# Patient Record
Sex: Female | Born: 1940 | Race: Black or African American | Hispanic: No | Marital: Single | Smoking: Former smoker
Health system: Southern US, Community
[De-identification: ages and names within clinical notes are randomized; demographics above are authoritative.]

## PROBLEM LIST (undated history)

## (undated) DIAGNOSIS — C787 Secondary malignant neoplasm of liver and intrahepatic bile duct: Principal | ICD-10-CM

## (undated) DIAGNOSIS — I10 Essential (primary) hypertension: Secondary | ICD-10-CM

## (undated) DIAGNOSIS — D500 Iron deficiency anemia secondary to blood loss (chronic): Secondary | ICD-10-CM

## (undated) DIAGNOSIS — M1990 Unspecified osteoarthritis, unspecified site: Secondary | ICD-10-CM

## (undated) DIAGNOSIS — C7951 Secondary malignant neoplasm of bone: Secondary | ICD-10-CM

## (undated) DIAGNOSIS — C50919 Malignant neoplasm of unspecified site of unspecified female breast: Secondary | ICD-10-CM

## (undated) DIAGNOSIS — Z7189 Other specified counseling: Secondary | ICD-10-CM

## (undated) DIAGNOSIS — C7931 Secondary malignant neoplasm of brain: Secondary | ICD-10-CM

## (undated) HISTORY — DX: Secondary malignant neoplasm of brain: C79.31

## (undated) HISTORY — DX: Malignant neoplasm of unspecified site of unspecified female breast: C50.919

## (undated) HISTORY — PX: TONSILLECTOMY: SUR1361

## (undated) HISTORY — DX: Other specified counseling: Z71.89

## (undated) HISTORY — DX: Secondary malignant neoplasm of liver and intrahepatic bile duct: C78.7

---

## 1999-06-24 HISTORY — PX: BREAST SURGERY: SHX581

## 1999-10-15 ENCOUNTER — Ambulatory Visit (HOSPITAL_COMMUNITY): Admission: RE | Admit: 1999-10-15 | Discharge: 1999-10-15 | Payer: Self-pay | Admitting: *Deleted

## 2008-03-24 ENCOUNTER — Ambulatory Visit (HOSPITAL_COMMUNITY): Admission: RE | Admit: 2008-03-24 | Discharge: 2008-03-24 | Payer: Self-pay | Admitting: Obstetrics and Gynecology

## 2008-03-31 ENCOUNTER — Encounter: Admission: RE | Admit: 2008-03-31 | Discharge: 2008-03-31 | Payer: Self-pay | Admitting: Obstetrics and Gynecology

## 2008-04-06 ENCOUNTER — Encounter: Admission: RE | Admit: 2008-04-06 | Discharge: 2008-04-06 | Payer: Self-pay | Admitting: Obstetrics and Gynecology

## 2010-07-14 ENCOUNTER — Encounter: Payer: Self-pay | Admitting: Obstetrics and Gynecology

## 2010-11-08 NOTE — Op Note (Signed)
[place]. [facility]  Patient:    JENISHA, FAISON                         MRN: [id number] Proc. Date: 10/15/99 Adm. Date:  [id number] Disc. Date: [id number] Attending:  Stephenie Acres Dictator:   Catalina Lunger, M.D.                           Operative Report  PREOPERATIVE DIAGNOSIS:  Sebaceous cyst of the right breast.  POSTOPERATIVE DIAGNOSIS:  Sebaceous cyst of the right breast.  PROCEDURE:  Excision of sebaceous cyst.  SURGEON:  Dr. Lovie Chol.  ANESTHESIA:  Local and MAC.  DESCRIPTION OF PROCEDURE:  The patient was taken to the operating room, placed in the supine position after adequate anesthesia was induced using MAC technique.  The right breast was prepped and draped in the normal sterile fashion using 1% lidocaine local anesthesia of the skin surrounding the sebaceous cyst and fistulas was anesthetized.  An elliptical incision was made around the entire area, dissecting down to normal subcutaneous tissue, and removed all the abnormal tissue and block.  The skin was closed with interrupted 3-0 Nylon suture and Steri-Strips.  Sterile dressing was applied. The patient was taken to the PACU in good condition. DD:  10/15/99 TD:  10/16/99 Job: [id number]

## 2015-11-06 ENCOUNTER — Other Ambulatory Visit: Payer: Self-pay | Admitting: Family Medicine

## 2015-11-06 DIAGNOSIS — N6310 Unspecified lump in the right breast, unspecified quadrant: Secondary | ICD-10-CM

## 2015-11-29 ENCOUNTER — Other Ambulatory Visit: Payer: Self-pay | Admitting: Family Medicine

## 2015-11-29 ENCOUNTER — Ambulatory Visit
Admission: RE | Admit: 2015-11-29 | Discharge: 2015-11-29 | Disposition: A | Discharge status: Home / Self Care | Payer: Medicare Other | Source: Ambulatory Visit | Attending: Family Medicine | Admitting: Family Medicine

## 2015-11-29 DIAGNOSIS — N6310 Unspecified lump in the right breast, unspecified quadrant: Secondary | ICD-10-CM

## 2015-11-29 DIAGNOSIS — N6320 Unspecified lump in the left breast, unspecified quadrant: Secondary | ICD-10-CM

## 2015-12-05 ENCOUNTER — Other Ambulatory Visit: Payer: Self-pay | Admitting: Family Medicine

## 2015-12-05 DIAGNOSIS — N6310 Unspecified lump in the right breast, unspecified quadrant: Secondary | ICD-10-CM

## 2015-12-06 ENCOUNTER — Ambulatory Visit
Admission: RE | Admit: 2015-12-06 | Discharge: 2015-12-06 | Disposition: A | Discharge status: Home / Self Care | Payer: Medicare Other | Source: Ambulatory Visit | Attending: Family Medicine | Admitting: Family Medicine

## 2015-12-06 DIAGNOSIS — C50919 Malignant neoplasm of unspecified site of unspecified female breast: Secondary | ICD-10-CM

## 2015-12-06 DIAGNOSIS — N6310 Unspecified lump in the right breast, unspecified quadrant: Secondary | ICD-10-CM

## 2015-12-06 DIAGNOSIS — N6320 Unspecified lump in the left breast, unspecified quadrant: Secondary | ICD-10-CM

## 2015-12-06 HISTORY — DX: Malignant neoplasm of unspecified site of unspecified female breast: C50.919

## 2015-12-13 ENCOUNTER — Encounter: Payer: Self-pay | Admitting: Hematology and Oncology

## 2015-12-13 ENCOUNTER — Telehealth: Payer: Self-pay | Admitting: Hematology and Oncology

## 2015-12-13 NOTE — Telephone Encounter (Signed)
Appointment made with [facility] on 6/28 at 3:45PM. Demographics and location given to the patient. Letter to the referring.

## 2015-12-17 ENCOUNTER — Telehealth: Payer: Self-pay | Admitting: *Deleted

## 2015-12-17 ENCOUNTER — Encounter: Payer: Self-pay | Admitting: Radiation Oncology

## 2015-12-17 ENCOUNTER — Ambulatory Visit
Admission: RE | Admit: 2015-12-17 | Discharge: 2015-12-17 | Disposition: A | Discharge status: Home / Self Care | Payer: Medicare Other | Source: Ambulatory Visit | Attending: Radiation Oncology | Admitting: Radiation Oncology

## 2015-12-17 VITALS — BP 131/61 | HR 68 | Temp 98.3°F | Resp 16 | Ht 63.0 in | Wt 178.8 lb

## 2015-12-17 DIAGNOSIS — Z87891 Personal history of nicotine dependence: Secondary | ICD-10-CM | POA: Diagnosis not present

## 2015-12-17 DIAGNOSIS — C50411 Malignant neoplasm of upper-outer quadrant of right female breast: Secondary | ICD-10-CM | POA: Insufficient documentation

## 2015-12-17 DIAGNOSIS — Z170 Estrogen receptor positive status [ER+]: Secondary | ICD-10-CM | POA: Diagnosis not present

## 2015-12-17 DIAGNOSIS — Z79899 Other long term (current) drug therapy: Secondary | ICD-10-CM | POA: Insufficient documentation

## 2015-12-17 DIAGNOSIS — I10 Essential (primary) hypertension: Secondary | ICD-10-CM | POA: Insufficient documentation

## 2015-12-17 HISTORY — DX: Malignant neoplasm of unspecified site of unspecified female breast: C50.919

## 2015-12-17 HISTORY — DX: Essential (primary) hypertension: I10

## 2015-12-17 NOTE — Progress Notes (Signed)
Radiation Oncology         [phone] ________________________________  Name: Michelle Bowen MRN: [id number]  Date: 12/17/2015  DOB: 1941-06-21  CC:No primary care provider on file.  Excell Seltzer, MD     REFERRING PHYSICIAN: Excell Seltzer, MD  DIAGNOSIS: The encounter diagnosis was Breast cancer of upper-outer quadrant of right female breast ([place]).  HISTORY OF PRESENT ILLNESS: Michelle Bowen is a 75 y.o. female seen at the request of Dr. Excell Seltzer for a new diagnosis of right breast cancer. The patient was first evaluated on 11/29/15 after a palpable lesion in the right breast was felt for a couple of month, she thought this was a recurrence of a sebaceous cyst that was removed in 2004. Bilateral diagnostic mammogram revealed a 3.3 cm area of lobulation within the right breast and an 8 mm mass in the lateral slightly upper quadrant of the right breast with 2 abnormal right axillary lymph nodes the largest being 3.4 cm. In the superior left breast in the anterior depth there is a 9 mm oval mass with indistinct margins and a small group of pleomorphic calcifications spanning 5 mm in the upper outer quadrant of the left breast. Stereotactic biopsy of the left breast calcifications were recommended.   Ultrasound revealed a 4.4 x 3 x 2.9 cm right breast mass at 1:00 4 cm from the nipple and at 10:00 a 0.8 x 0.7 x 0.5 cm circumscribed mass consistent with benign cyst. Multiple abnormal lymph nodes in the axilla measuring up to 3 cm were present. In the left breast at 11:30 there was a 0.7 x 0.7 x 0.4 cm indistinct but circumscribed lesion with multiple borderline appearing lymph nodes measuring up to 4 mm in the left axilla. She underwent biopsy on 12/06/2015 revealing ER 100% positive PR negative at 0%, HER-2/neu positive and Ki-67 at 80% invasive ductal carcinoma of the right breast and noted in the right axillary lymph node biopsy. 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    The patient has met with Dr.Hoxworth but is contemplating a second opinion and has also declined genetic testing. Stereotactic biopsy of the left breast calcifications has been ordered but not scheduled. She has plans to see Dr. [name] later this week.   Gynecological History: [id number] Menarche at age 11 First live birth at age 41 Oral contraceptives for a short time   PREVIOUS RADIATION THERAPY: No  PAST MEDICAL HISTORY:  Past Medical History  Diagnosis Date  . Breast cancer ([place]) 12/06/15    Right  . Hypertension        PAST SURGICAL HISTORY: Past Surgical History  Procedure Laterality Date  . Tonsillectomy      FAMILY HISTORY:  Family History  Problem Relation Age of Onset  . Breast cancer Sister 30    breast  . Breast cancer      neice at 9  . Breast cancer      neice at 50    SOCIAL HISTORY:  reports that she has quit smoking. She does not have any smokeless tobacco history on file. She reports that she does not drink alcohol or use illicit drugs.   ALLERGIES: Review of patient's allergies indicates no known allergies.   MEDICATIONS:  Current Outpatient Prescriptions  Medication Sig Dispense Refill  . Multiple Vitamin (MULTIVITAMIN) tablet Take 1 tablet by mouth daily.    . Omega-3 Fatty Acids (FISH OIL) 1000 MG CAPS Take 1,000 mg by mouth daily.    Marland Kitchen  triamterene-hydrochlorothiazide (MAXZIDE-25) 37.5-25 MG tablet Take 1 tablet by mouth daily.    . vitamin B-12 (CYANOCOBALAMIN) 250 MCG tablet Take 250 mcg by mouth daily.    Marland Kitchen loratadine (CLARITIN) 10 MG tablet Take 10 mg by mouth daily. Reported on 12/17/2015  0   No current facility-administered medications for this encounter.     REVIEW OF SYSTEMS: On review of systems, the patient reports that she is doing well overall. She denies any chest pain, shortness of breath, cough, fevers, chills, night sweats, unintended weight changes. She denies any bowel or bladder disturbances, and denies  abdominal pain, nausea or vomiting. She denies any new musculoskeletal or joint aches or pains. A complete review of systems is obtained and is otherwise negative.    PHYSICAL EXAM:  height is '5\' 3"'$  (1.6 m) and weight is 178 lb 12.8 oz (81.103 kg). Her oral temperature is 98.3 F (36.8 C). Her blood pressure is 131/61 and her pulse is 68. Her respiration is 16.    Pain scale 0/10 In general this is a well appearing African femal in no acute distress. She is alert and oriented x4 and appropriate throughout the examination. HEENT reveals that the patient is normocephalic, atraumatic. EOMs are intact. PERRLA. Skin is intact without any evidence of gross lesions. Cardiovascular exam reveals a regular rate and rhythm, no clicks rubs or murmurs are auscultated. Chest is clear to auscultation bilaterally.Breast exam reveals right breast has incision line from previous breast surgery at 12 o'clock deep to this there 3 cm area of fullness and induration with well demarcated borders. There is questionable fullness at 11 o'clock corresponding to other findings on mammogram but less distinct. There is palpable fullness in the right axilla consistent with known adenopathy. Lymphatic assessment is performed and does not reveal any adenopathy in the cervical, supraclavicular, axillary, or inguinal chains. Abdomen has active bowel sounds in all quadrants and is intact. The abdomen is soft, non tender, non distended. Lower extremities are negative for pretibial pitting edema, deep calf tenderness, cyanosis or clubbing  ECOG = 0  0 - Asymptomatic (Fully active, able to carry on all predisease activities without restriction)  1 - Symptomatic but completely ambulatory (Restricted in physically strenuous activity but ambulatory and able to carry out work of a light or sedentary nature. For example, light housework, office work)  2 - Symptomatic, <50% in bed during the day (Ambulatory and capable of all self care but  unable to carry out any work activities. Up and about more than 50% of waking hours)  3 - Symptomatic, >50% in bed, but not bedbound (Capable of only limited self-care, confined to bed or chair 50% or more of waking hours)  4 - Bedbound (Completely disabled. Cannot carry on any self-care. Totally confined to bed or chair)  5 - Death   Eustace Pen MM, Creech RH, Tormey DC, et al. [phone]). "Toxicity and response criteria of the [facility]". [place] Oncol. 5 (6): 649-55   LABORATORY DATA:  No results found for: WBC, HGB, HCT, MCV, PLT No results found for: NA, K, CL, CO2 No results found for: ALT, AST, GGT, ALKPHOS, BILITOT    RADIOGRAPHY: Mm Digital Diagnostic Bilat  12/06/2015  CLINICAL DATA:  Status post biopsies of right breast and left breast EXAM: DIAGNOSTIC BILATERAL MAMMOGRAM POST ULTRASOUND BIOPSIES COMPARISON:  Previous exam(s). FINDINGS: Mammographic images were obtained following ultrasound guided biopsy of mass at 1 o'clock. The mammogram demonstrates coil biopsy clip in the mass  of concern. (The pathology sample is labeled #1.) Biopsies also performed in the enlarged right axillary lymph node. The patient refused biopsy clip placement. (The pathology sample is labeled #2.) Biopsy is in the mass at left breast 11:30 o'clock. Post biopsy mammogram demonstrate ribbon biopsy clip in the mass of concern. (The pathology sample is labeled #3.) IMPRESSION: Post biopsy mammogram demonstrating biopsy clips in the areas of concern. Final Assessment: Post Procedure Mammograms for Marker Placement Electronically Signed   By: Abelardo Diesel M.D.   On: 12/06/2015 16:27   US Breast Ltd Uni Left Inc Axilla  11/29/2015  CLINICAL DATA:  75 year old female presenting for evaluation of a palpable area of concern in the right breast which has been present for few months. EXAM: 2D DIGITAL DIAGNOSTIC BILATERAL MAMMOGRAM WITH CAD AND ADJUNCT TOMO BILATERAL BREAST ULTRASOUND COMPARISON:   Previous exam(s). ACR Breast Density Category b: There are scattered areas of fibroglandular density. FINDINGS: A palpable marker has been placed along the upper-inner quadrant of the right breast. Deep to the marker there is a suspicious lobulated mass measuring approximately 3.3 cm. Additionally, there is a second obscured 8 mm mass in the lateral slightly upper quadrant of the right breast, middle depth. There are 2 abnormal right axillary lymph nodes, the largest of which measures up to 3.4 cm. In the superior left breast in the anterior depth there is a 9 mm oval mass with indistinct margins. In the upper-outer quadrant of the left breast there is a small group of pleomorphic calcifications spanning approximately 5 mm. Mammographic images were processed with CAD. Ultrasound of the palpable area of concern in the right breast demonstrates a large irregular hypoechoic mass with indistinct margins and significant internal blood flow. The mass is located at 1 o'clock, 4 cm from the nipple and measures 4.4 x 3.0 x 2.9 cm. In the right breast at 10 o'clock, 6 cm from the nipple there is an anechoic circumscribed mass measuring 0.8 x 0.7 x 0.5 cm consistent with a benign cyst. Multiple abnormal appearing lymph nodes are seen in the axilla, the largest measuring up to 3.0 cm, with cortical thickness of to 1.0 cm. In the left breast at 11:30, 2 cm from the nipple there is an oval mass with circumscribed and indistinct margins measuring 0.7 x 0.7 x 0.4 cm. Blood flow is documented within this mass on color Doppler imaging. There are multiple borderline appearing lymph nodes with cortices measuring up to 4 mm. IMPRESSION: 1. There is a 4.4 cm highly suspicious mass at the site of palpable concern in the right breast at 1 o'clock. 2.  Multiple abnormal right axillary lymph nodes. 3.  There is a 7 mm indeterminate mass in the left breast at 11:30. 4. Indeterminate 5 mm group of new pleomorphic calcifications in the  upper-outer left breast. 5.  Borderline abnormal lymph nodes in the left axilla. RECOMMENDATION: 1. Ultrasound-guided biopsy is recommended for the right breast mass at 1 o'clock, and for the 1 of the abnormal right axillary lymph nodes. 2. Ultrasound-guided biopsy is recommended for the left breast mass at 11:30. 3. Stereotactic biopsy is recommended for the calcifications in the upper-outer quadrant of the left breast. The ultrasound-guided biopsies have been scheduled for 12/06/2015 at 3:30 p.m. The stereotactic biopsy will be reserved for another date. I have discussed the findings and recommendations with the patient. Results were also provided in writing at the conclusion of the visit. If applicable, a reminder letter will be sent to the  patient regarding the next appointment. BI-RADS CATEGORY  5: Highly suggestive of malignancy. Electronically Signed   By: Ammie Ferrier M.D.   On: 11/29/2015 14:03   US Breast Ltd Uni Right Inc Axilla  11/29/2015  CLINICAL DATA:  75 year old female presenting for evaluation of a palpable area of concern in the right breast which has been present for few months. EXAM: 2D DIGITAL DIAGNOSTIC BILATERAL MAMMOGRAM WITH CAD AND ADJUNCT TOMO BILATERAL BREAST ULTRASOUND COMPARISON:  Previous exam(s). ACR Breast Density Category b: There are scattered areas of fibroglandular density. FINDINGS: A palpable marker has been placed along the upper-inner quadrant of the right breast. Deep to the marker there is a suspicious lobulated mass measuring approximately 3.3 cm. Additionally, there is a second obscured 8 mm mass in the lateral slightly upper quadrant of the right breast, middle depth. There are 2 abnormal right axillary lymph nodes, the largest of which measures up to 3.4 cm. In the superior left breast in the anterior depth there is a 9 mm oval mass with indistinct margins. In the upper-outer quadrant of the left breast there is a small group of pleomorphic calcifications  spanning approximately 5 mm. Mammographic images were processed with CAD. Ultrasound of the palpable area of concern in the right breast demonstrates a large irregular hypoechoic mass with indistinct margins and significant internal blood flow. The mass is located at 1 o'clock, 4 cm from the nipple and measures 4.4 x 3.0 x 2.9 cm. In the right breast at 10 o'clock, 6 cm from the nipple there is an anechoic circumscribed mass measuring 0.8 x 0.7 x 0.5 cm consistent with a benign cyst. Multiple abnormal appearing lymph nodes are seen in the axilla, the largest measuring up to 3.0 cm, with cortical thickness of to 1.0 cm. In the left breast at 11:30, 2 cm from the nipple there is an oval mass with circumscribed and indistinct margins measuring 0.7 x 0.7 x 0.4 cm. Blood flow is documented within this mass on color Doppler imaging. There are multiple borderline appearing lymph nodes with cortices measuring up to 4 mm. IMPRESSION: 1. There is a 4.4 cm highly suspicious mass at the site of palpable concern in the right breast at 1 o'clock. 2.  Multiple abnormal right axillary lymph nodes. 3.  There is a 7 mm indeterminate mass in the left breast at 11:30. 4. Indeterminate 5 mm group of new pleomorphic calcifications in the upper-outer left breast. 5.  Borderline abnormal lymph nodes in the left axilla. RECOMMENDATION: 1. Ultrasound-guided biopsy is recommended for the right breast mass at 1 o'clock, and for the 1 of the abnormal right axillary lymph nodes. 2. Ultrasound-guided biopsy is recommended for the left breast mass at 11:30. 3. Stereotactic biopsy is recommended for the calcifications in the upper-outer quadrant of the left breast. The ultrasound-guided biopsies have been scheduled for 12/06/2015 at 3:30 p.m. The stereotactic biopsy will be reserved for another date. I have discussed the findings and recommendations with the patient. Results were also provided in writing at the conclusion of the visit. If  applicable, a reminder letter will be sent to the patient regarding the next appointment. BI-RADS CATEGORY  5: Highly suggestive of malignancy. Electronically Signed   By: Ammie Ferrier M.D.   On: 11/29/2015 14:03   Mm Diag Breast Tomo Bilateral  11/29/2015  CLINICAL DATA:  75 year old female presenting for evaluation of a palpable area of concern in the right breast which has been present for few months. EXAM: 2D DIGITAL  DIAGNOSTIC BILATERAL MAMMOGRAM WITH CAD AND ADJUNCT TOMO BILATERAL BREAST ULTRASOUND COMPARISON:  Previous exam(s). ACR Breast Density Category b: There are scattered areas of fibroglandular density. FINDINGS: A palpable marker has been placed along the upper-inner quadrant of the right breast. Deep to the marker there is a suspicious lobulated mass measuring approximately 3.3 cm. Additionally, there is a second obscured 8 mm mass in the lateral slightly upper quadrant of the right breast, middle depth. There are 2 abnormal right axillary lymph nodes, the largest of which measures up to 3.4 cm. In the superior left breast in the anterior depth there is a 9 mm oval mass with indistinct margins. In the upper-outer quadrant of the left breast there is a small group of pleomorphic calcifications spanning approximately 5 mm. Mammographic images were processed with CAD. Ultrasound of the palpable area of concern in the right breast demonstrates a large irregular hypoechoic mass with indistinct margins and significant internal blood flow. The mass is located at 1 o'clock, 4 cm from the nipple and measures 4.4 x 3.0 x 2.9 cm. In the right breast at 10 o'clock, 6 cm from the nipple there is an anechoic circumscribed mass measuring 0.8 x 0.7 x 0.5 cm consistent with a benign cyst. Multiple abnormal appearing lymph nodes are seen in the axilla, the largest measuring up to 3.0 cm, with cortical thickness of to 1.0 cm. In the left breast at 11:30, 2 cm from the nipple there is an oval mass with  circumscribed and indistinct margins measuring 0.7 x 0.7 x 0.4 cm. Blood flow is documented within this mass on color Doppler imaging. There are multiple borderline appearing lymph nodes with cortices measuring up to 4 mm. IMPRESSION: 1. There is a 4.4 cm highly suspicious mass at the site of palpable concern in the right breast at 1 o'clock. 2.  Multiple abnormal right axillary lymph nodes. 3.  There is a 7 mm indeterminate mass in the left breast at 11:30. 4. Indeterminate 5 mm group of new pleomorphic calcifications in the upper-outer left breast. 5.  Borderline abnormal lymph nodes in the left axilla. RECOMMENDATION: 1. Ultrasound-guided biopsy is recommended for the right breast mass at 1 o'clock, and for the 1 of the abnormal right axillary lymph nodes. 2. Ultrasound-guided biopsy is recommended for the left breast mass at 11:30. 3. Stereotactic biopsy is recommended for the calcifications in the upper-outer quadrant of the left breast. The ultrasound-guided biopsies have been scheduled for 12/06/2015 at 3:30 p.m. The stereotactic biopsy will be reserved for another date. I have discussed the findings and recommendations with the patient. Results were also provided in writing at the conclusion of the visit. If applicable, a reminder letter will be sent to the patient regarding the next appointment. BI-RADS CATEGORY  5: Highly suggestive of malignancy. Electronically Signed   By: Ammie Ferrier M.D.   On: 11/29/2015 14:03   [place] Lt Breast Bx W Loc Dev 1st Lesion Img Bx Spec US Guide  12/07/2015  ADDENDUM REPORT: 12/07/2015 15:15 ADDENDUM: Pathology revealed GRADE III INVASIVE MAMMARY CARCINOMA of the Right breast at the 1:00 o'clock location. LYMPH NODE POSITIVE FOR METASTATIC MAMMARY CARCINOMA of the Right axilla. INTRADUCTAL PAPILLOMA of the Left breast at the 11:30 o'clock location, with excision recommended. This was found to be concordant by Dr. Abelardo Diesel. Pathology results were discussed with the  patient by telephone. The patient reported doing well after the biopsies with tenderness at the sites. Post biopsy instructions and care were reviewed and  questions were answered. The patient was encouraged to call [facility] for any additional concerns. Surgical consultation has been arranged with Dr. Donnie Mesa at [facility], per patient request, on December 17, 2015. Pathology results reported by Terie Purser, RN on 12/07/2015. Electronically Signed   By: Abelardo Diesel M.D.   On: 12/07/2015 15:15  12/07/2015  CLINICAL DATA:  Mass in left breast for biopsy EXAM: ULTRASOUND GUIDED LEFT BREAST CORE NEEDLE BIOPSY COMPARISON:  Previous exam(s). FINDINGS: I met with the patient and we discussed the procedure of ultrasound-guided biopsy, including benefits and alternatives. We discussed the high likelihood of a successful procedure. We discussed the risks of the procedure, including infection, bleeding, tissue injury, clip migration, and inadequate sampling. Informed written consent was given. The usual time-out protocol was performed immediately prior to the procedure. Using sterile technique and 2% Lidocaine as local anesthetic, under direct ultrasound visualization, a 14 gauge spring-loaded device was used to perform biopsy of mass at left breast 11:30 o'clock using a lateral approach. At the conclusion of the procedure a ribbon tissue marker clip was deployed into the biopsy cavity. Follow up 2 view mammogram was performed and dictated separately. IMPRESSION: Ultrasound guided biopsy of left breast.  No apparent complications. Electronically Signed: By: Abelardo Diesel M.D. On: 12/06/2015 16:10   [place] Rt Breast Bx W Loc Dev 1st Lesion Img Bx Spec US Guide  12/07/2015  ADDENDUM REPORT: 12/07/2015 15:16 ADDENDUM: Pathology revealed GRADE III INVASIVE MAMMARY CARCINOMA of the Right breast at the 1:00 o'clock location. LYMPH NODE POSITIVE FOR METASTATIC MAMMARY CARCINOMA of the  Right axilla. INTRADUCTAL PAPILLOMA of the Left breast at the 11:30 o'clock location, with excision recommended. This was found to be concordant by Dr. Abelardo Diesel. Pathology results were discussed with the patient by telephone. The patient reported doing well after the biopsies with tenderness at the sites. Post biopsy instructions and care were reviewed and questions were answered. The patient was encouraged to call [facility] for any additional concerns. Surgical consultation has been arranged with Dr. Donnie Mesa at [facility], per patient request, on December 17, 2015. Pathology results reported by Terie Purser, RN on 12/07/2015. Electronically Signed   By: Abelardo Diesel M.D.   On: 12/07/2015 15:16  12/07/2015  CLINICAL DATA:  Mass in right breast and abnormal enlarged right axillary lymph node for biopsy EXAM: ULTRASOUND GUIDED RIGHT BREAST AND RIGHT AXILLARY LYMPH NODE CORE NEEDLE BIOPSY COMPARISON:  Previous exam(s). FINDINGS: I met with the patient and we discussed the procedure of ultrasound-guided biopsy, including benefits and alternatives. We discussed the high likelihood of a successful procedure. We discussed the risks of the procedure, including infection, bleeding, tissue injury, clip migration, and inadequate sampling. Informed written consent was given. The usual time-out protocol was performed immediately prior to the procedure. Using sterile technique and 1% Lidocaine as local anesthetic, under direct ultrasound visualization, a 14 gauge spring-loaded device was used to perform biopsy of right breast 1 o'clock mass using a medial approach. At the conclusion of the procedure a coil tissue marker clip was deployed into the biopsy cavity. Follow up 2 view mammogram was performed and dictated separately. Using sterile technique and 1% Lidocaine as local anesthetic, under direct ultrasound visualization, a 14 gauge spring-loaded device was used to perform  biopsy of abnormal enlarged right axillary lymph node using a lateral approach. At the conclusion of the procedure no tissue marker clip was deployed into  the biopsy cavity because the patient refused clip. Follow up 2 view mammogram was performed and dictated separately. IMPRESSION: Ultrasound guided biopsies of right breast and right axilla. No apparent complications. Electronically Signed: By: Abelardo Diesel M.D. On: 12/06/2015 16:07   [place] Rt Breast Bx W Loc Dev Ea Add Lesion Img Bx Spec US Guide  12/07/2015  ADDENDUM REPORT: 12/07/2015 15:16 ADDENDUM: Pathology revealed GRADE III INVASIVE MAMMARY CARCINOMA of the Right breast at the 1:00 o'clock location. LYMPH NODE POSITIVE FOR METASTATIC MAMMARY CARCINOMA of the Right axilla. INTRADUCTAL PAPILLOMA of the Left breast at the 11:30 o'clock location, with excision recommended. This was found to be concordant by Dr. Abelardo Diesel. Pathology results were discussed with the patient by telephone. The patient reported doing well after the biopsies with tenderness at the sites. Post biopsy instructions and care were reviewed and questions were answered. The patient was encouraged to call [facility] for any additional concerns. Surgical consultation has been arranged with Dr. Donnie Mesa at [facility], per patient request, on December 17, 2015. Pathology results reported by Terie Purser, RN on 12/07/2015. Electronically Signed   By: Abelardo Diesel M.D.   On: 12/07/2015 15:16  12/07/2015  CLINICAL DATA:  Mass in right breast and abnormal enlarged right axillary lymph node for biopsy EXAM: ULTRASOUND GUIDED RIGHT BREAST AND RIGHT AXILLARY LYMPH NODE CORE NEEDLE BIOPSY COMPARISON:  Previous exam(s). FINDINGS: I met with the patient and we discussed the procedure of ultrasound-guided biopsy, including benefits and alternatives. We discussed the high likelihood of a successful procedure. We discussed the risks of the procedure,  including infection, bleeding, tissue injury, clip migration, and inadequate sampling. Informed written consent was given. The usual time-out protocol was performed immediately prior to the procedure. Using sterile technique and 1% Lidocaine as local anesthetic, under direct ultrasound visualization, a 14 gauge spring-loaded device was used to perform biopsy of right breast 1 o'clock mass using a medial approach. At the conclusion of the procedure a coil tissue marker clip was deployed into the biopsy cavity. Follow up 2 view mammogram was performed and dictated separately. Using sterile technique and 1% Lidocaine as local anesthetic, under direct ultrasound visualization, a 14 gauge spring-loaded device was used to perform biopsy of abnormal enlarged right axillary lymph node using a lateral approach. At the conclusion of the procedure no tissue marker clip was deployed into the biopsy cavity because the patient refused clip. Follow up 2 view mammogram was performed and dictated separately. IMPRESSION: Ultrasound guided biopsies of right breast and right axilla. No apparent complications. Electronically Signed: By: Abelardo Diesel M.D. 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    PLAN: Dr. Lisbeth Renshaw discusses the pathology and the workup at this time the patient is still undergoing. She will likely need systemic therapy and will be meeting with Dr. [name] this week. We will follow up with his recommendations as well as the surgeons recommendations. If she does undergo lumpectomy, we discussed the rationale for risk reduction of her disease, and discussed that there are scenarios as well that radiotherapy would be considered in the setting of mastectomy. We discussed the possible side effects of radiation therapy including but not limited to skin redness,  fatigue, permanent skin darkening, and breast swelling. We discussed the process of simulation and the placement  of tattoos. Dr. Lisbeth Renshaw discussed that once she completes systemic therapy we would meet back again to discuss the options for radiation, and we will see her back at that time.  In a visit lasting 60 minutes, greater than 50% of the time was spent reviewing her workup, discussing the role of radiation, and coordinating her care.  The above documentation reflects my direct findings during this shared patient visit. Please see the separate note by Dr. Lisbeth Renshaw on this date for the remainder of the patient's plan of care.    Carola Rhine, PAC  This document serves as a record of services personally performed by Kyung Rudd, MD. It was created on his behalf by Derek Mound, a trained medical scribe. The creation of this record is based on the scribe's personal observations and the provider's statements to them. This document has been checked and approved by the attending provider.

## 2015-12-17 NOTE — Telephone Encounter (Signed)
Received appt date/time from Andrea.  Placed a note for an intake form to be given to the pt at time of check in. 

## 2015-12-17 NOTE — Progress Notes (Signed)
Please see the Nurse Progress Note in the MD Initial Consult Encounter for this patient. 

## 2015-12-17 NOTE — Progress Notes (Addendum)
Location of Breast Cancer:Right Breast `10 ' o'clock  Histology per Pathology Report: Diagnosis 12/06/15 1. Breast, right, needle core biopsy, 1:00 mass - INVASIVE MAMMARY CARCINOMA.- SEE COMMENT. 2. Lymph node, needle/core biopsy, right axilla - LYMPH NODE POSITIVE FOR METASTATIC MAMMARY CARCINOMA.- SEE COMMENT. 3. Breast, left, needle core biopsy, 11:30 o'clock mass - INTRADUCTAL PAPILLOMA.   Receptor Status: ER(100%+), PR (0% neg), Her2-neu (+), Ki-(80%)  Did patient present with symptoms (if so, please note symptoms) or was this found on screening mammography?:   Past/Anticipated interventions by surgeon, if any:Dr. Melina Schools    Past/Anticipated interventions by medical oncology, if any: Chemotherapy : Dr. [name] appt 12/19/15,referral genetics Counseling,  Lymphedema issues, if any:     Pain issues, if any: sore ness  Right breast, right nipple darker than left nipple, scarring from removal cyst 2004  SAFETY ISSUES:  Prior radiation? NO  Pacemaker/ICD? NO  Possible current pregnancy?N/A  Is the patient on methotrexate? NO   Current Complaints / other details: Single,  G2P1, 1 abortion, Menarche age 76, 25 st live birth age 41  G2P1,HX B/L, Oral contraceptives for a short time, no HRT,  Breast  Biopsy, multiple Right breast,  removed sebaceous cyst 2004 BP 131/61 mmHg  Pulse 68  Temp(Src) 98.3 F (36.8 C) (Oral)  Resp 16  Ht '5\' 3"'$  (1.6 m)  Wt 178 lb 12.8 oz (81.103 kg)  BMI 31.68 kg/m2  Wt Readings from Last 3 Encounters:  12/17/15 178 lb 12.8 oz (81.103 kg)    Occasional alcohol use former cigarette smoker, 7 years  1 pack a week quit age 86  Allergies:NKA  Sister breast ca, dx age 19, Mastectomy ,living age 91

## 2015-12-19 ENCOUNTER — Encounter: Payer: Self-pay | Admitting: Radiation Oncology

## 2015-12-19 ENCOUNTER — Ambulatory Visit (HOSPITAL_BASED_OUTPATIENT_CLINIC_OR_DEPARTMENT_OTHER): Payer: Medicare Other | Admitting: Hematology and Oncology

## 2015-12-19 ENCOUNTER — Encounter: Payer: Self-pay | Admitting: Hematology and Oncology

## 2015-12-19 VITALS — BP 146/78 | HR 72 | Temp 98.3°F | Resp 18 | Ht 63.0 in | Wt 177.1 lb

## 2015-12-19 DIAGNOSIS — C50411 Malignant neoplasm of upper-outer quadrant of right female breast: Secondary | ICD-10-CM

## 2015-12-19 DIAGNOSIS — C773 Secondary and unspecified malignant neoplasm of axilla and upper limb lymph nodes: Secondary | ICD-10-CM | POA: Diagnosis not present

## 2015-12-19 DIAGNOSIS — Z170 Estrogen receptor positive status [ER+]: Secondary | ICD-10-CM | POA: Diagnosis not present

## 2015-12-19 NOTE — Progress Notes (Signed)
[facility] CONSULT NOTE  No care team member to display  CHIEF COMPLAINTS/PURPOSE OF CONSULTATION:  Newly diagnosed breast cancer  HISTORY OF PRESENTING ILLNESS:  Michelle Bowen 75 y.o. female is here because of recent diagnosis of right breast cancer. She felt a lump in the right breast which was then subsequently evaluated by mammogram and ultrasound. Mammogram revealed bilateral breast masses. The right breast mass measured 4.4 cm. In the left side there were 2 areas of abnormality zone millimeter abnormality which on biopsy came back as intraductal papilloma. There was another 5 mm area of calcifications which was not biopsied. The right breast biopsy came back as invasive ductal carcinoma and also the right axillary lymph node was also positive for breast cancer. The cancer was ER positive PR negative HER-2 positive with a Ki-67 of 80% in the primary tumor and 90+ on the lymph node. It was grade 3. She was presented this morning in the multidisciplinary tumor board and she is here today to discuss a treatment plan. She was originally seen by Dr. Excell Seltzer but plans to switch her surgeons to Dr. Barry Dienes.  I reviewed her records extensively and collaborated the history with the patient.  SUMMARY OF ONCOLOGIC HISTORY:   Breast cancer of upper-outer quadrant of right female breast ([place])   11/29/2015 Mammogram Right breast palpable lump: 1:00: 4.4 cm mass, multiple abnormal axillary lymph nodes, left breast 7 mm mass (intraductal papilloma), 5 mm calcifications, borderline abnormal lymph nodes left axilla, T2 N1 stage IIB   12/06/2015 Initial Biopsy Right breast biopsy 1:00: Invasive ductal carcinoma, lymph node also positive, grade 3, ER 100%, PR 0%, Ki-67 90%, HER-2 positive ratio 4.05, gene copy #7.9   MEDICAL HISTORY:  Past Medical History  Diagnosis Date  . Breast cancer ([place]) 12/06/15    Right  . Hypertension     SURGICAL HISTORY: Past Surgical History  Procedure Laterality  Date  . Tonsillectomy      SOCIAL HISTORY: Social History   Social History  . Marital Status: Single    Spouse Name: N/A  . Number of Children: N/A  . Years of Education: N/A   Occupational History  . Not on file.   Social History Main Topics  . Smoking status: Former Research scientist (life sciences)  . Smokeless tobacco: Not on file  . Alcohol Use: No  . Drug Use: No     Comment: smoke 1pack per week 7y, quit age 89  . Sexual Activity: No   Other Topics Concern  . Not on file   Social History Narrative  . No narrative on file    FAMILY HISTORY: Family History  Problem Relation Age of Onset  . Breast cancer Sister 30    breast  . Breast cancer      neice at 9  . Breast cancer      neice at 50    ALLERGIES:  has No Known Allergies.  MEDICATIONS:  Current Outpatient Prescriptions  Medication Sig Dispense Refill  . loratadine (CLARITIN) 10 MG tablet Take 10 mg by mouth daily. Reported on 12/17/2015  0  . Multiple Vitamin (MULTIVITAMIN) tablet Take 1 tablet by mouth daily.    . Omega-3 Fatty Acids (FISH OIL) 1000 MG CAPS Take 1,000 mg by mouth daily.    Marland Kitchen triamterene-hydrochlorothiazide (MAXZIDE-25) 37.5-25 MG tablet Take 1 tablet by mouth daily.    . vitamin B-12 (CYANOCOBALAMIN) 250 MCG tablet Take 250 mcg by mouth daily.     No current facility-administered medications  for this visit.    REVIEW OF SYSTEMS:   Constitutional: Denies fevers, chills or abnormal night sweats Eyes: Denies blurriness of vision, double vision or watery eyes Ears, nose, mouth, throat, and face: Denies mucositis or sore throat Respiratory: Denies cough, dyspnea or wheezes Cardiovascular: Denies palpitation, chest discomfort or lower extremity swelling Gastrointestinal:  Denies nausea, heartburn or change in bowel habits Skin: Denies abnormal skin rashes Lymphatics: Denies new lymphadenopathy or easy bruising Neurological:Denies numbness, tingling or new weaknesses Behavioral/Psych: Mood is stable, no new  changes  Breast: Right breast lump All other systems were reviewed with the patient and are negative.  PHYSICAL EXAMINATION: ECOG PERFORMANCE STATUS: 1 - Symptomatic but completely ambulatory  Filed Vitals:   12/19/15 1530  BP: 146/78  Pulse: 72  Temp: 98.3 F (36.8 C)  Resp: 18   Filed Weights   12/19/15 1530  Weight: 177 lb 1.6 oz (80.332 kg)    GENERAL:alert, no distress and comfortable SKIN: skin color, texture, turgor are normal, no rashes or significant lesions EYES: normal, conjunctiva are pink and non-injected, sclera clear OROPHARYNX:no exudate, no erythema and lips, buccal mucosa, and tongue normal  NECK: supple, thyroid normal size, non-tender, without nodularity LYMPH:  no palpable lymphadenopathy in the cervical, axillary or inguinal LUNGS: clear to auscultation and percussion with normal breathing effort HEART: regular rate & rhythm and no murmurs and no lower extremity edema ABDOMEN:abdomen soft, non-tender and normal bowel sounds Musculoskeletal:no cyanosis of digits and no clubbing  PSYCH: alert & oriented x 3 with fluent speech NEURO: no focal motor/sensory deficits BREAST: Palpable lump in the right breast. No palpable axillary or supraclavicular lymphadenopathy (exam performed in the presence of a chaperone)   RADIOGRAPHIC STUDIES: I have personally reviewed the radiological reports and agreed with the findings in the report.  ASSESSMENT AND PLAN:  Breast cancer of upper-outer quadrant of right female breast ([place]) Right breast palpable lump 11/25/2015: 1:00: 4.4 cm mass, multiple abnormal axillary lymph nodes, left breast 7 mm mass (intraductal papilloma), 5 mm calcifications, borderline abnormal lymph nodes left axilla, T2 N1 stage IIB Right breast biopsy 1:00: 12/09/2015: Invasive ductal carcinoma, lymph node also positive, grade 3, ER 100%, PR 0%, Ki-67 90%, HER-2 positive ratio 4.05, gene copy #7.9  Pathology and radiology counseling: Discussed with  the patient, the details of pathology including the type of breast cancer,the clinical staging, the significance of ER, PR and HER-2/neu receptors and the implications for treatment. After reviewing the pathology in detail, we proceeded to discuss the different treatment options between surgery, radiation, chemotherapy, antiestrogen therapies.  Recommendation: 1. Breast MRI 2. Biopsy of the 5 mm left breast mass and lymph node 3. Staging scans mainly because patient does have abdominal discomfort and positive lymph nodes. 4. If no metastatic disease, neoadjuvant chemotherapy with TCH Perjeta 6 cycles followed by Herceptin maintenance for 1 year 5. Genetic counseling was offered but patient declined 6. After neoadjuvant chemotherapy consideration for breast conserving surgery 7. Followed by adjuvant radiation 8. Followed by adjuvant antiestrogen therapy  Chemotherapy Counseling: I discussed the risks and benefits of chemotherapy including the risks of nausea/ vomiting, risk of infection from low WBC count, fatigue due to chemo or anemia, bruising or bleeding due to low platelets, mouth sores, loss/ change in taste and decreased appetite. Liver and kidney function will be monitored through out chemotherapy as abnormalities in liver and kidney function may be a side effect of treatment. Cardiac dysfunction due to Herceptin and Perjeta were discussed in detail. Risk  of permanent bone marrow dysfunction and leukemia due to chemo were also discussed.  Plan: 1. Staging scans 2. Echocardiogram 3. Chemotherapy class 4. Port placement  Return to clinic in 2 weeks to start chemotherapy     All questions were answered. The patient knows to call the clinic with any problems, questions or concerns.    Rulon Eisenmenger, MD 12/19/2015

## 2015-12-19 NOTE — Assessment & Plan Note (Signed)
Right breast palpable lump 11/25/2015: 1:00: 4.4 cm mass, multiple abnormal axillary lymph nodes, left breast 7 mm mass (intraductal papilloma), 5 mm calcifications, borderline abnormal lymph nodes left axilla, T2 N1 stage IIB Right breast biopsy 1:00: 12/09/2015: Invasive ductal carcinoma, lymph node also positive, grade 3, ER 100%, PR 0%, Ki-67 90%, HER-2 positive ratio 4.05, gene copy #7.9  Pathology and radiology counseling: Discussed with the patient, the details of pathology including the type of breast cancer,the clinical staging, the significance of ER, PR and HER-2/neu receptors and the implications for treatment. After reviewing the pathology in detail, we proceeded to discuss the different treatment options between surgery, radiation, chemotherapy, antiestrogen therapies.  Recommendation: 1. Breast MRI 2. Biopsy of the 5 mm left breast mass and lymph node 3. Staging scans 4. If no metastatic disease, neoadjuvant chemotherapy with TCH Perjeta 6 cycles followed by Herceptin maintenance for 1 year 5. Genetic counseling 6. After neoadjuvant chemotherapy consideration for breast conserving surgery 7. Followed by adjuvant radiation 8. Followed by adjuvant antiestrogen therapy  Chemotherapy Counseling: I discussed the risks and benefits of chemotherapy including the risks of nausea/ vomiting, risk of infection from low WBC count, fatigue due to chemo or anemia, bruising or bleeding due to low platelets, mouth sores, loss/ change in taste and decreased appetite. Liver and kidney function will be monitored through out chemotherapy as abnormalities in liver and kidney function may be a side effect of treatment. Cardiac dysfunction due to Herceptin and Perjeta were discussed in detail. Risk of permanent bone marrow dysfunction and leukemia due to chemo were also discussed.  Plan: 1. Staging scans 2. Echocardiogram 3. Chemotherapy class 4. Port placement  Return to clinic in 1-2 weeks to start  chemotherapy

## 2015-12-19 NOTE — Addendum Note (Signed)
Encounter addended by: Kyung Rudd, MD on: 12/19/2015  9:52 PM<BR>     Documentation filed: LOS Section, Follow-up Section, Problem List

## 2015-12-20 ENCOUNTER — Other Ambulatory Visit: Payer: Self-pay | Admitting: Hematology and Oncology

## 2015-12-20 ENCOUNTER — Other Ambulatory Visit: Payer: Self-pay | Admitting: *Deleted

## 2015-12-20 ENCOUNTER — Ambulatory Visit: Payer: Medicare Other | Admitting: Hematology and Oncology

## 2015-12-20 DIAGNOSIS — C50411 Malignant neoplasm of upper-outer quadrant of right female breast: Secondary | ICD-10-CM

## 2015-12-20 NOTE — Addendum Note (Signed)
Addended by: Renford Dills on: 12/20/2015 08:41 AM   Modules accepted: Orders, Medications

## 2015-12-21 ENCOUNTER — Telehealth: Payer: Self-pay | Admitting: Hematology and Oncology

## 2015-12-21 NOTE — Telephone Encounter (Signed)
lvm for pt regarding to 7.3 appt

## 2015-12-24 ENCOUNTER — Other Ambulatory Visit (HOSPITAL_BASED_OUTPATIENT_CLINIC_OR_DEPARTMENT_OTHER): Payer: Medicare Other

## 2015-12-24 ENCOUNTER — Other Ambulatory Visit: Payer: Medicare Other

## 2015-12-24 ENCOUNTER — Encounter: Payer: Self-pay | Admitting: Hematology and Oncology

## 2015-12-24 DIAGNOSIS — C50411 Malignant neoplasm of upper-outer quadrant of right female breast: Secondary | ICD-10-CM | POA: Diagnosis not present

## 2015-12-24 LAB — COMPREHENSIVE METABOLIC PANEL
ALBUMIN: 4 g/dL (ref 3.5–5.0)
ALK PHOS: 84 U/L (ref 40–150)
ALT: 31 U/L (ref 0–55)
ANION GAP: 9 meq/L (ref 3–11)
AST: 33 U/L (ref 5–34)
BILIRUBIN TOTAL: 0.73 mg/dL (ref 0.20–1.20)
BUN: 12 mg/dL (ref 7.0–26.0)
CO2: 31 mEq/L — ABNORMAL HIGH (ref 22–29)
Calcium: 10.2 mg/dL (ref 8.4–10.4)
Chloride: 100 mEq/L (ref 98–109)
Creatinine: 1.1 mg/dL (ref 0.6–1.1)
EGFR: 55 mL/min/{1.73_m2} — AB (ref >90)
GLUCOSE: 94 mg/dL (ref 70–140)
Potassium: 3.3 mEq/L — ABNORMAL LOW (ref 3.5–5.1)
SODIUM: 140 meq/L (ref 136–145)
TOTAL PROTEIN: 8.9 g/dL — AB (ref 6.4–8.3)

## 2015-12-24 LAB — CBC WITH DIFFERENTIAL/PLATELET
BASO%: 1.2 % (ref 0.0–2.0)
Basophils Absolute: 0.1 10*3/uL (ref 0.0–0.1)
EOS ABS: 0.1 10*3/uL (ref 0.0–0.5)
EOS%: 1.7 % (ref 0.0–7.0)
HCT: 39.7 % (ref 34.8–46.6)
HEMOGLOBIN: 12.6 g/dL (ref 11.6–15.9)
LYMPH%: 41 % (ref 14.0–49.7)
MCH: 26.3 pg (ref 25.1–34.0)
MCHC: 31.7 g/dL (ref 31.5–36.0)
MCV: 83 fL (ref 79.5–101.0)
MONO#: 0.4 10*3/uL (ref 0.1–0.9)
MONO%: 9.1 % (ref 0.0–14.0)
NEUT%: 47 % (ref 38.4–76.8)
NEUTROS ABS: 2.1 10*3/uL (ref 1.5–6.5)
PLATELETS: 283 10*3/uL (ref 145–400)
RBC: 4.78 10*6/uL (ref 3.70–5.45)
RDW: 14.3 % (ref 11.2–14.5)
WBC: 4.5 10*3/uL (ref 3.9–10.3)
lymph#: 1.8 10*3/uL (ref 0.9–3.3)

## 2015-12-24 NOTE — Progress Notes (Signed)
Introduced myself as one of the [employer].  Pt has 2 insurances so copay assistance is not needed so I offered the [employer] to assist w/ gasoline & personal bills.  Pt would like to apply for the grant so she will bring her proof of income on 12/26/15.  I will also give her Shauna's card to contact if she needs anything or have questions in the future.

## 2015-12-26 ENCOUNTER — Encounter: Payer: Self-pay | Admitting: Hematology and Oncology

## 2015-12-26 ENCOUNTER — Encounter (HOSPITAL_COMMUNITY)
Admission: RE | Admit: 2015-12-26 | Discharge: 2015-12-26 | Disposition: A | Discharge status: Home / Self Care | Payer: Medicare Other | Source: Ambulatory Visit | Attending: Hematology and Oncology | Admitting: Hematology and Oncology

## 2015-12-26 ENCOUNTER — Telehealth: Payer: Self-pay | Admitting: *Deleted

## 2015-12-26 ENCOUNTER — Other Ambulatory Visit: Payer: Self-pay | Admitting: *Deleted

## 2015-12-26 DIAGNOSIS — C50411 Malignant neoplasm of upper-outer quadrant of right female breast: Secondary | ICD-10-CM | POA: Insufficient documentation

## 2015-12-26 MED ORDER — TECHNETIUM TC 99M MEDRONATE IV KIT
25.0000 | PACK | Freq: Once | INTRAVENOUS | Status: AC | PRN
Start: 2015-12-26 — End: 2015-12-26
  Administered 2015-12-26: 25 via INTRAVENOUS

## 2015-12-26 NOTE — Telephone Encounter (Signed)
Spoke to [facility] [facility] at [facility] for new appt with Dr. Barry Dienes to discuss port placement. Given 12/28/15 at 12pm Called and confirmed appt date and time with pt. Gave navigation resources and contact information. Denies further needs at this time.

## 2015-12-26 NOTE — Telephone Encounter (Signed)
Confirmed echo for 7/11 at 11am

## 2015-12-26 NOTE — Progress Notes (Signed)
Pt is approved for the $1000 [employer] grant.  

## 2015-12-27 ENCOUNTER — Other Ambulatory Visit: Payer: Self-pay | Admitting: Hematology and Oncology

## 2015-12-27 ENCOUNTER — Ambulatory Visit (HOSPITAL_COMMUNITY)
Admission: RE | Admit: 2015-12-27 | Discharge: 2015-12-27 | Disposition: A | Discharge status: Home / Self Care | Payer: Medicare Other | Source: Ambulatory Visit | Attending: Hematology and Oncology | Admitting: Hematology and Oncology

## 2015-12-27 DIAGNOSIS — R911 Solitary pulmonary nodule: Secondary | ICD-10-CM | POA: Insufficient documentation

## 2015-12-27 DIAGNOSIS — K5730 Diverticulosis of large intestine without perforation or abscess without bleeding: Secondary | ICD-10-CM | POA: Insufficient documentation

## 2015-12-27 DIAGNOSIS — C50411 Malignant neoplasm of upper-outer quadrant of right female breast: Secondary | ICD-10-CM | POA: Diagnosis not present

## 2015-12-27 DIAGNOSIS — N858 Other specified noninflammatory disorders of uterus: Secondary | ICD-10-CM | POA: Diagnosis not present

## 2015-12-27 DIAGNOSIS — I700 Atherosclerosis of aorta: Secondary | ICD-10-CM | POA: Diagnosis not present

## 2015-12-27 DIAGNOSIS — R590 Localized enlarged lymph nodes: Secondary | ICD-10-CM | POA: Diagnosis not present

## 2015-12-27 MED ORDER — IOPAMIDOL (ISOVUE-300) INJECTION 61%
100.0000 mL | Freq: Once | INTRAVENOUS | Status: AC | PRN
  Administered 2015-12-27: 100 mL via INTRAVENOUS

## 2015-12-28 ENCOUNTER — Ambulatory Visit
Admission: RE | Admit: 2015-12-28 | Discharge: 2015-12-28 | Disposition: A | Discharge status: Home / Self Care | Payer: Medicare Other | Source: Ambulatory Visit | Attending: Hematology and Oncology | Admitting: Hematology and Oncology

## 2015-12-28 DIAGNOSIS — C50411 Malignant neoplasm of upper-outer quadrant of right female breast: Secondary | ICD-10-CM

## 2015-12-28 MED ORDER — GADOBENATE DIMEGLUMINE 529 MG/ML IV SOLN
16.0000 mL | Freq: Once | INTRAVENOUS | Status: AC | PRN
  Administered 2015-12-28: 16 mL via INTRAVENOUS

## 2015-12-31 ENCOUNTER — Other Ambulatory Visit: Payer: Self-pay | Admitting: Hematology and Oncology

## 2016-01-01 ENCOUNTER — Telehealth: Payer: Self-pay | Admitting: Hematology and Oncology

## 2016-01-01 ENCOUNTER — Other Ambulatory Visit: Payer: Self-pay | Admitting: General Surgery

## 2016-01-01 ENCOUNTER — Other Ambulatory Visit (HOSPITAL_COMMUNITY): Payer: Medicare Other

## 2016-01-01 ENCOUNTER — Other Ambulatory Visit: Payer: Self-pay | Admitting: Hematology and Oncology

## 2016-01-01 DIAGNOSIS — R928 Other abnormal and inconclusive findings on diagnostic imaging of breast: Secondary | ICD-10-CM

## 2016-01-01 NOTE — Telephone Encounter (Signed)
lvm to inform pt of 7/18 appts per pof. Sent staff message to [facility] to be advised on pt follow up appt 7/24 due to provider schedule being capped

## 2016-01-02 ENCOUNTER — Ambulatory Visit (HOSPITAL_COMMUNITY)
Admission: RE | Admit: 2016-01-02 | Discharge: 2016-01-02 | Disposition: A | Discharge status: Home / Self Care | Payer: Medicare Other | Source: Ambulatory Visit | Attending: Hematology and Oncology | Admitting: Hematology and Oncology

## 2016-01-02 DIAGNOSIS — I119 Hypertensive heart disease without heart failure: Secondary | ICD-10-CM | POA: Diagnosis not present

## 2016-01-02 DIAGNOSIS — C50411 Malignant neoplasm of upper-outer quadrant of right female breast: Secondary | ICD-10-CM | POA: Insufficient documentation

## 2016-01-02 DIAGNOSIS — I358 Other nonrheumatic aortic valve disorders: Secondary | ICD-10-CM | POA: Diagnosis not present

## 2016-01-02 DIAGNOSIS — Z01810 Encounter for preprocedural cardiovascular examination: Secondary | ICD-10-CM | POA: Insufficient documentation

## 2016-01-02 NOTE — Progress Notes (Signed)
  Echocardiogram 2D Echocardiogram has been performed.  Darlina Sicilian M 01/02/2016, 10:43 AM

## 2016-01-08 ENCOUNTER — Ambulatory Visit: Payer: Medicare Other | Admitting: Hematology and Oncology

## 2016-01-08 ENCOUNTER — Ambulatory Visit: Payer: Medicare Other

## 2016-01-08 ENCOUNTER — Other Ambulatory Visit: Payer: Medicare Other

## 2016-01-11 ENCOUNTER — Other Ambulatory Visit: Payer: Self-pay | Admitting: General Surgery

## 2016-01-11 ENCOUNTER — Encounter (HOSPITAL_BASED_OUTPATIENT_CLINIC_OR_DEPARTMENT_OTHER): Payer: Self-pay | Admitting: *Deleted

## 2016-01-11 DIAGNOSIS — R928 Other abnormal and inconclusive findings on diagnostic imaging of breast: Secondary | ICD-10-CM

## 2016-01-14 ENCOUNTER — Other Ambulatory Visit: Payer: Self-pay | Admitting: *Deleted

## 2016-01-15 ENCOUNTER — Telehealth: Payer: Self-pay | Admitting: Hematology and Oncology

## 2016-01-15 ENCOUNTER — Encounter (HOSPITAL_BASED_OUTPATIENT_CLINIC_OR_DEPARTMENT_OTHER)
Admission: RE | Admit: 2016-01-15 | Discharge: 2016-01-15 | Disposition: A | Discharge status: Home / Self Care | Payer: Medicare Other | Source: Ambulatory Visit | Attending: General Surgery | Admitting: General Surgery

## 2016-01-15 ENCOUNTER — Other Ambulatory Visit: Payer: Self-pay

## 2016-01-15 DIAGNOSIS — C50211 Malignant neoplasm of upper-inner quadrant of right female breast: Secondary | ICD-10-CM | POA: Diagnosis not present

## 2016-01-15 DIAGNOSIS — I10 Essential (primary) hypertension: Secondary | ICD-10-CM | POA: Diagnosis not present

## 2016-01-15 DIAGNOSIS — Z87891 Personal history of nicotine dependence: Secondary | ICD-10-CM | POA: Diagnosis not present

## 2016-01-15 DIAGNOSIS — E669 Obesity, unspecified: Secondary | ICD-10-CM | POA: Diagnosis not present

## 2016-01-15 DIAGNOSIS — C50911 Malignant neoplasm of unspecified site of right female breast: Secondary | ICD-10-CM | POA: Diagnosis present

## 2016-01-15 NOTE — Telephone Encounter (Signed)
Spoke with pt to confirm 8/4 appt at 945 am per pof

## 2016-01-15 NOTE — Pre-Procedure Instructions (Signed)
PT in fot EKG, read by Dr. Al Corpus. Boost Breeze given and instructions reviewed.

## 2016-01-16 ENCOUNTER — Ambulatory Visit
Admission: RE | Admit: 2016-01-16 | Discharge: 2016-01-16 | Disposition: A | Discharge status: Home / Self Care | Payer: Medicare Other | Source: Ambulatory Visit | Attending: General Surgery | Admitting: General Surgery

## 2016-01-16 DIAGNOSIS — R928 Other abnormal and inconclusive findings on diagnostic imaging of breast: Secondary | ICD-10-CM

## 2016-01-17 ENCOUNTER — Ambulatory Visit (HOSPITAL_BASED_OUTPATIENT_CLINIC_OR_DEPARTMENT_OTHER): Payer: Medicare Other | Admitting: Anesthesiology

## 2016-01-17 ENCOUNTER — Encounter (HOSPITAL_BASED_OUTPATIENT_CLINIC_OR_DEPARTMENT_OTHER): Payer: Self-pay | Admitting: Certified Registered"

## 2016-01-17 ENCOUNTER — Ambulatory Visit
Admission: RE | Admit: 2016-01-17 | Discharge: 2016-01-17 | Disposition: A | Discharge status: Home / Self Care | Payer: Medicare Other | Source: Ambulatory Visit | Attending: General Surgery | Admitting: General Surgery

## 2016-01-17 ENCOUNTER — Encounter (HOSPITAL_BASED_OUTPATIENT_CLINIC_OR_DEPARTMENT_OTHER)
Admission: RE | Disposition: A | Discharge status: Home / Self Care | Payer: Self-pay | Source: Ambulatory Visit | Attending: General Surgery

## 2016-01-17 ENCOUNTER — Ambulatory Visit (HOSPITAL_BASED_OUTPATIENT_CLINIC_OR_DEPARTMENT_OTHER)
Admission: RE | Admit: 2016-01-17 | Discharge: 2016-01-18 | Disposition: A | Discharge status: Home / Self Care | Payer: Medicare Other | Source: Ambulatory Visit | Attending: General Surgery | Admitting: General Surgery

## 2016-01-17 DIAGNOSIS — E669 Obesity, unspecified: Secondary | ICD-10-CM | POA: Diagnosis not present

## 2016-01-17 DIAGNOSIS — C50211 Malignant neoplasm of upper-inner quadrant of right female breast: Secondary | ICD-10-CM | POA: Diagnosis not present

## 2016-01-17 DIAGNOSIS — R928 Other abnormal and inconclusive findings on diagnostic imaging of breast: Secondary | ICD-10-CM

## 2016-01-17 DIAGNOSIS — C773 Secondary and unspecified malignant neoplasm of axilla and upper limb lymph nodes: Secondary | ICD-10-CM

## 2016-01-17 DIAGNOSIS — I10 Essential (primary) hypertension: Secondary | ICD-10-CM | POA: Diagnosis not present

## 2016-01-17 DIAGNOSIS — Z87891 Personal history of nicotine dependence: Secondary | ICD-10-CM | POA: Diagnosis not present

## 2016-01-17 DIAGNOSIS — C50919 Malignant neoplasm of unspecified site of unspecified female breast: Secondary | ICD-10-CM | POA: Diagnosis present

## 2016-01-17 HISTORY — DX: Unspecified osteoarthritis, unspecified site: M19.90

## 2016-01-17 HISTORY — PX: BREAST LUMPECTOMY WITH AXILLARY LYMPH NODE DISSECTION: SHX5756

## 2016-01-17 HISTORY — PX: RADIOACTIVE SEED GUIDED EXCISIONAL BREAST BIOPSY: SHX6490

## 2016-01-17 SURGERY — RADIOACTIVE SEED GUIDED BREAST BIOPSY
Anesthesia: General | Site: Breast | Laterality: Right

## 2016-01-17 MED ORDER — MIDAZOLAM HCL 2 MG/2ML IJ SOLN
1.0000 mg | INTRAMUSCULAR | Status: DC | PRN
  Administered 2016-01-17: 1 mg via INTRAVENOUS

## 2016-01-17 MED ORDER — FENTANYL CITRATE (PF) 100 MCG/2ML IJ SOLN
50.0000 ug | INTRAMUSCULAR | Status: AC | PRN
  Administered 2016-01-17 (×2): 25 ug via INTRAVENOUS
  Administered 2016-01-17 (×2): 50 ug via INTRAVENOUS

## 2016-01-17 MED ORDER — SCOPOLAMINE 1 MG/3DAYS TD PT72: 1.0000 | MEDICATED_PATCH | Freq: Once | TRANSDERMAL | Status: DC | PRN

## 2016-01-17 MED ORDER — SODIUM CHLORIDE 0.9 % IJ SOLN
INTRAMUSCULAR | Status: AC
  Filled 2016-01-17: qty 20

## 2016-01-17 MED ORDER — DEXAMETHASONE SODIUM PHOSPHATE 4 MG/ML IJ SOLN
INTRAMUSCULAR | Status: DC | PRN
  Administered 2016-01-17: 10 mg via INTRAVENOUS

## 2016-01-17 MED ORDER — KCL IN DEXTROSE-NACL 20-5-0.45 MEQ/L-%-% IV SOLN
INTRAVENOUS | Status: AC
Start: 2016-01-17 — End: 2016-01-17
  Administered 2016-01-17: 100 mL/h via INTRAVENOUS
  Filled 2016-01-17: qty 1000

## 2016-01-17 MED ORDER — PHENYLEPHRINE 40 MCG/ML (10ML) SYRINGE FOR IV PUSH (FOR BLOOD PRESSURE SUPPORT)
PREFILLED_SYRINGE | INTRAVENOUS | Status: AC
  Filled 2016-01-17: qty 10

## 2016-01-17 MED ORDER — BUPIVACAINE-EPINEPHRINE 0.25% -1:200000 IJ SOLN
INTRAMUSCULAR | Status: DC | PRN
  Administered 2016-01-17: 15 mL

## 2016-01-17 MED ORDER — GABAPENTIN 300 MG PO CAPS
300.0000 mg | ORAL_CAPSULE | ORAL | Status: AC
  Administered 2016-01-17: 300 mg via ORAL

## 2016-01-17 MED ORDER — LACTATED RINGERS IV SOLN
INTRAVENOUS | Status: DC
  Administered 2016-01-17 (×3): via INTRAVENOUS

## 2016-01-17 MED ORDER — ONDANSETRON HCL 4 MG/2ML IJ SOLN: 4.0000 mg | Freq: Once | INTRAMUSCULAR | Status: DC | PRN

## 2016-01-17 MED ORDER — ZOLPIDEM TARTRATE 5 MG PO TABS: 5.0000 mg | ORAL_TABLET | Freq: Every evening | ORAL | Status: DC | PRN

## 2016-01-17 MED ORDER — EPHEDRINE 5 MG/ML INJ
INTRAVENOUS | Status: AC
  Filled 2016-01-17: qty 10

## 2016-01-17 MED ORDER — DIPHENHYDRAMINE HCL 12.5 MG/5ML PO ELIX: 12.5000 mg | ORAL_SOLUTION | Freq: Four times a day (QID) | ORAL | Status: DC | PRN

## 2016-01-17 MED ORDER — LIDOCAINE 2% (20 MG/ML) 5 ML SYRINGE
INTRAMUSCULAR | Status: AC
  Filled 2016-01-17: qty 5

## 2016-01-17 MED ORDER — CEFAZOLIN SODIUM-DEXTROSE 2-4 GM/100ML-% IV SOLN
2.0000 g | INTRAVENOUS | Status: AC
  Administered 2016-01-17: 2 g via INTRAVENOUS

## 2016-01-17 MED ORDER — CHLORHEXIDINE GLUCONATE CLOTH 2 % EX PADS: 6.0000 | MEDICATED_PAD | Freq: Once | CUTANEOUS | Status: DC

## 2016-01-17 MED ORDER — ACETAMINOPHEN 650 MG RE SUPP: 650.0000 mg | Freq: Four times a day (QID) | RECTAL | Status: DC | PRN

## 2016-01-17 MED ORDER — FENTANYL CITRATE (PF) 100 MCG/2ML IJ SOLN
INTRAMUSCULAR | Status: AC
  Filled 2016-01-17: qty 2

## 2016-01-17 MED ORDER — ONDANSETRON HCL 4 MG/2ML IJ SOLN: 4.0000 mg | Freq: Four times a day (QID) | INTRAMUSCULAR | Status: DC | PRN

## 2016-01-17 MED ORDER — PHENYLEPHRINE HCL 10 MG/ML IJ SOLN
INTRAMUSCULAR | Status: DC | PRN
  Administered 2016-01-17 (×3): 80 ug via INTRAVENOUS

## 2016-01-17 MED ORDER — EPHEDRINE SULFATE 50 MG/ML IJ SOLN
INTRAMUSCULAR | Status: DC | PRN
  Administered 2016-01-17 (×2): 10 mg via INTRAVENOUS

## 2016-01-17 MED ORDER — ONDANSETRON 4 MG PO TBDP: 4.0000 mg | ORAL_TABLET | Freq: Four times a day (QID) | ORAL | Status: DC | PRN

## 2016-01-17 MED ORDER — FENTANYL CITRATE (PF) 100 MCG/2ML IJ SOLN: 25.0000 ug | INTRAMUSCULAR | Status: DC | PRN

## 2016-01-17 MED ORDER — DIPHENHYDRAMINE HCL 50 MG/ML IJ SOLN: 12.5000 mg | Freq: Four times a day (QID) | INTRAMUSCULAR | Status: DC | PRN

## 2016-01-17 MED ORDER — DOCUSATE SODIUM 100 MG PO CAPS
100.0000 mg | ORAL_CAPSULE | Freq: Two times a day (BID) | ORAL | Status: DC
  Administered 2016-01-17: 100 mg via ORAL
  Filled 2016-01-17: qty 1

## 2016-01-17 MED ORDER — OXYCODONE HCL 5 MG PO TABS
5.0000 mg | ORAL_TABLET | ORAL | Status: DC | PRN
  Administered 2016-01-17 – 2016-01-18 (×3): 5 mg via ORAL
  Filled 2016-01-17 (×3): qty 1

## 2016-01-17 MED ORDER — FENTANYL CITRATE (PF) 100 MCG/2ML IJ SOLN
25.0000 ug | INTRAMUSCULAR | Status: DC | PRN
  Administered 2016-01-17: 50 ug via INTRAVENOUS
  Administered 2016-01-17 (×2): 25 ug via INTRAVENOUS
  Administered 2016-01-17: 50 ug via INTRAVENOUS

## 2016-01-17 MED ORDER — CEFAZOLIN SODIUM-DEXTROSE 2-4 GM/100ML-% IV SOLN
2.0000 g | Freq: Three times a day (TID) | INTRAVENOUS | Status: AC
  Administered 2016-01-17: 2 g via INTRAVENOUS

## 2016-01-17 MED ORDER — GABAPENTIN 300 MG PO CAPS
ORAL_CAPSULE | ORAL | Status: AC
  Filled 2016-01-17: qty 1

## 2016-01-17 MED ORDER — TRIAMTERENE-HCTZ 37.5-25 MG PO TABS: 1.0000 | ORAL_TABLET | Freq: Every day | ORAL | Status: DC

## 2016-01-17 MED ORDER — CELECOXIB 400 MG PO CAPS
400.0000 mg | ORAL_CAPSULE | ORAL | Status: AC
  Administered 2016-01-17: 400 mg via ORAL

## 2016-01-17 MED ORDER — BUPIVACAINE-EPINEPHRINE (PF) 0.5% -1:200000 IJ SOLN
INTRAMUSCULAR | Status: AC
  Filled 2016-01-17: qty 60

## 2016-01-17 MED ORDER — DEXAMETHASONE SODIUM PHOSPHATE 10 MG/ML IJ SOLN
INTRAMUSCULAR | Status: AC
  Filled 2016-01-17: qty 1

## 2016-01-17 MED ORDER — BUPIVACAINE-EPINEPHRINE (PF) 0.25% -1:200000 IJ SOLN
INTRAMUSCULAR | Status: AC
  Filled 2016-01-17: qty 60

## 2016-01-17 MED ORDER — OXYCODONE HCL 5 MG PO TABS: 5.0000 mg | ORAL_TABLET | Freq: Four times a day (QID) | ORAL | 0 refills | Status: DC | PRN

## 2016-01-17 MED ORDER — SIMETHICONE 80 MG PO CHEW: 40.0000 mg | CHEWABLE_TABLET | Freq: Four times a day (QID) | ORAL | Status: DC | PRN

## 2016-01-17 MED ORDER — FENTANYL CITRATE (PF) 100 MCG/2ML IJ SOLN
INTRAMUSCULAR | Status: AC
Start: 2016-01-17 — End: 2016-01-17
  Filled 2016-01-17: qty 2

## 2016-01-17 MED ORDER — CELECOXIB 200 MG PO CAPS
ORAL_CAPSULE | ORAL | Status: AC
  Filled 2016-01-17: qty 2

## 2016-01-17 MED ORDER — MIDAZOLAM HCL 2 MG/2ML IJ SOLN
INTRAMUSCULAR | Status: AC
  Filled 2016-01-17: qty 2

## 2016-01-17 MED ORDER — PROPOFOL 10 MG/ML IV BOLUS
INTRAVENOUS | Status: DC | PRN
  Administered 2016-01-17: 150 mg via INTRAVENOUS
  Administered 2016-01-17 (×2): 50 mg via INTRAVENOUS

## 2016-01-17 MED ORDER — ACETAMINOPHEN 500 MG PO TABS
1000.0000 mg | ORAL_TABLET | ORAL | Status: AC
  Administered 2016-01-17: 1000 mg via ORAL

## 2016-01-17 MED ORDER — LIDOCAINE HCL (PF) 1 % IJ SOLN
INTRAMUSCULAR | Status: DC | PRN
  Administered 2016-01-17: 15 mL

## 2016-01-17 MED ORDER — LIDOCAINE HCL (PF) 1 % IJ SOLN
INTRAMUSCULAR | Status: AC
  Filled 2016-01-17: qty 60

## 2016-01-17 MED ORDER — ACETAMINOPHEN 325 MG PO TABS: 650.0000 mg | ORAL_TABLET | Freq: Four times a day (QID) | ORAL | Status: DC | PRN

## 2016-01-17 MED ORDER — CEFAZOLIN SODIUM-DEXTROSE 2-4 GM/100ML-% IV SOLN
INTRAVENOUS | Status: AC
  Filled 2016-01-17: qty 100

## 2016-01-17 MED ORDER — METHOCARBAMOL 500 MG PO TABS: 500.0000 mg | ORAL_TABLET | Freq: Four times a day (QID) | ORAL | Status: DC | PRN

## 2016-01-17 MED ORDER — BUPIVACAINE-EPINEPHRINE (PF) 0.5% -1:200000 IJ SOLN
INTRAMUSCULAR | Status: DC | PRN
  Administered 2016-01-17: 25 mL

## 2016-01-17 MED ORDER — LIDOCAINE HCL (CARDIAC) 20 MG/ML IV SOLN
INTRAVENOUS | Status: DC | PRN
  Administered 2016-01-17: 60 mg via INTRAVENOUS

## 2016-01-17 MED ORDER — ONDANSETRON HCL 4 MG/2ML IJ SOLN
INTRAMUSCULAR | Status: DC | PRN
  Administered 2016-01-17: 4 mg via INTRAVENOUS

## 2016-01-17 MED ORDER — ACETAMINOPHEN 500 MG PO TABS
ORAL_TABLET | ORAL | Status: AC
  Filled 2016-01-17: qty 2

## 2016-01-17 MED ORDER — METHYLENE BLUE 0.5 % INJ SOLN
INTRAVENOUS | Status: AC
  Filled 2016-01-17: qty 20

## 2016-01-17 MED ORDER — GLYCOPYRROLATE 0.2 MG/ML IJ SOLN: 0.2000 mg | Freq: Once | INTRAMUSCULAR | Status: DC | PRN

## 2016-01-17 MED ORDER — ONDANSETRON HCL 4 MG/2ML IJ SOLN
INTRAMUSCULAR | Status: AC
  Filled 2016-01-17: qty 2

## 2016-01-17 SURGICAL SUPPLY — 61 items
ADH SKN CLS APL DERMABOND .7 (GAUZE/BANDAGES/DRESSINGS) ×2
BINDER BREAST XLRG (GAUZE/BANDAGES/DRESSINGS) IMPLANT
BINDER BREAST XXLRG (GAUZE/BANDAGES/DRESSINGS) IMPLANT
BIOPATCH RED 1 DISK 7.0 (GAUZE/BANDAGES/DRESSINGS) ×3 IMPLANT
BLADE SURG 10 STRL SS (BLADE) ×3 IMPLANT
BLADE SURG 15 STRL LF DISP TIS (BLADE) ×2 IMPLANT
BLADE SURG 15 STRL SS (BLADE) ×3
BNDG COHESIVE 4X5 TAN STRL (GAUZE/BANDAGES/DRESSINGS) ×3 IMPLANT
CANISTER SUCT 1200ML W/VALVE (MISCELLANEOUS) ×3 IMPLANT
CHLORAPREP W/TINT 26ML (MISCELLANEOUS) ×3 IMPLANT
CLIP TI LARGE 6 (CLIP) ×6 IMPLANT
CLIP TI MEDIUM 6 (CLIP) ×5 IMPLANT
CLIP TI WIDE RED SMALL 6 (CLIP) ×3 IMPLANT
COVER BACK TABLE 60X90IN (DRAPES) ×3 IMPLANT
COVER MAYO STAND STRL (DRAPES) ×3 IMPLANT
COVER PROBE W GEL 5X96 (DRAPES) ×3 IMPLANT
DERMABOND ADVANCED (GAUZE/BANDAGES/DRESSINGS) ×1
DERMABOND ADVANCED .7 DNX12 (GAUZE/BANDAGES/DRESSINGS) ×2 IMPLANT
DEVICE DUBIN W/COMP PLATE 8390 (MISCELLANEOUS) ×3 IMPLANT
DRAIN CHANNEL 19F RND (DRAIN) ×3 IMPLANT
DRAPE UTILITY XL STRL (DRAPES) ×6 IMPLANT
ELECT COATED BLADE 2.86 ST (ELECTRODE) ×3 IMPLANT
ELECT REM PT RETURN 9FT ADLT (ELECTROSURGICAL) ×3
ELECTRODE REM PT RTRN 9FT ADLT (ELECTROSURGICAL) ×2 IMPLANT
EVACUATOR SILICONE 100CC (DRAIN) ×3 IMPLANT
GLOVE BIO SURGEON STRL SZ 6 (GLOVE) ×3 IMPLANT
GLOVE BIOGEL PI IND STRL 6.5 (GLOVE) ×2 IMPLANT
GLOVE BIOGEL PI IND STRL 7.0 (GLOVE) IMPLANT
GLOVE BIOGEL PI INDICATOR 6.5 (GLOVE) ×1
GLOVE BIOGEL PI INDICATOR 7.0 (GLOVE) ×2
GLOVE ECLIPSE 6.5 STRL STRAW (GLOVE) ×1 IMPLANT
GOWN STRL REUS W/ TWL LRG LVL3 (GOWN DISPOSABLE) ×2 IMPLANT
GOWN STRL REUS W/TWL 2XL LVL3 (GOWN DISPOSABLE) ×3 IMPLANT
GOWN STRL REUS W/TWL LRG LVL3 (GOWN DISPOSABLE) ×3
KIT MARKER MARGIN INK (KITS) ×3 IMPLANT
LIGHT WAVEGUIDE WIDE FLAT (MISCELLANEOUS) ×1 IMPLANT
NDL HYPO 25X1 1.5 SAFETY (NEEDLE) ×2 IMPLANT
NEEDLE HYPO 25X1 1.5 SAFETY (NEEDLE) ×3 IMPLANT
NS IRRIG 1000ML POUR BTL (IV SOLUTION) ×3 IMPLANT
PACK BASIN DAY SURGERY FS (CUSTOM PROCEDURE TRAY) ×3 IMPLANT
PACK UNIVERSAL I (CUSTOM PROCEDURE TRAY) ×3 IMPLANT
PENCIL BUTTON HOLSTER BLD 10FT (ELECTRODE) ×3 IMPLANT
PIN SAFETY STERILE (MISCELLANEOUS) ×3 IMPLANT
SLEEVE SCD COMPRESS KNEE MED (MISCELLANEOUS) ×3 IMPLANT
SPONGE GAUZE 4X4 12PLY STER LF (GAUZE/BANDAGES/DRESSINGS) ×2 IMPLANT
SPONGE LAP 18X18 X RAY DECT (DISPOSABLE) ×5 IMPLANT
STOCKINETTE IMPERVIOUS LG (DRAPES) ×3 IMPLANT
STRIP CLOSURE SKIN 1/2X4 (GAUZE/BANDAGES/DRESSINGS) ×3 IMPLANT
SUT ETHILON 3 0 PS 1 (SUTURE) ×1 IMPLANT
SUT MNCRL AB 4-0 PS2 18 (SUTURE) ×6 IMPLANT
SUT MON AB 4-0 PC3 18 (SUTURE) ×3 IMPLANT
SUT VIC AB 2-0 SH 27 (SUTURE) ×3
SUT VIC AB 2-0 SH 27XBRD (SUTURE) ×2 IMPLANT
SUT VIC AB 3-0 SH 27 (SUTURE) ×3
SUT VIC AB 3-0 SH 27X BRD (SUTURE) IMPLANT
SUT VICRYL 3-0 CR8 SH (SUTURE) ×6 IMPLANT
SYR CONTROL 10ML LL (SYRINGE) ×3 IMPLANT
TOWEL OR 17X24 6PK STRL BLUE (TOWEL DISPOSABLE) ×3 IMPLANT
TOWEL OR NON WOVEN STRL DISP B (DISPOSABLE) ×3 IMPLANT
TUBE CONNECTING 20X1/4 (TUBING) ×3 IMPLANT
YANKAUER SUCT BULB TIP NO VENT (SUCTIONS) ×3 IMPLANT

## 2016-01-17 NOTE — Anesthesia Procedure Notes (Signed)
Anesthesia Regional Block:  Pectoralis block  Pre-Anesthetic Checklist: ,, timeout performed, Correct Patient, Correct Site, Correct Laterality, Correct Procedure, Correct Position, site marked, Risks and benefits discussed,  Surgical consent,  Pre-op evaluation,  At surgeon's request and post-op pain management  Laterality: Right  Prep: Maximum Sterile Barrier Precautions used, chloraprep       Needles:  Injection technique: Single-shot  Needle Type: Echogenic Stimulator Needle     Needle Length: 10cm 10 cm Needle Gauge: 21 G    Additional Needles:  Procedures: ultrasound guided (picture in chart) and nerve stimulator Pectoralis block Narrative:  Injection made incrementally with aspirations every 5 mL.  Performed by: Personally  Anesthesiologist: Jasmyne Lodato, [facility]  Additional Notes: Patient tolerated the procedure well without complications

## 2016-01-17 NOTE — Transfer of Care (Signed)
Immediate Anesthesia Transfer of Care Note  Patient: Michelle Bowen  Procedure(s) Performed: Procedure(s): RADIOACTIVE SEED GUIDED EXCISIONAL LEFT BREAST BIOPSY (Left) RIGHT BREAST LUMPECTOMY WITH AXILLARY LYMPH NODE DISSECTION (Right)  Patient Location: PACU  Anesthesia Type:GA combined with regional for post-op pain  Level of Consciousness: awake, sedated and responds to stimulation  Airway & Oxygen Therapy: Patient Spontanous Breathing and Patient connected to face mask oxygen  Post-op Assessment: Report given to RN, Post -op Vital signs reviewed and stable and Patient moving all extremities  Post vital signs: Reviewed and stable  Last Vitals:  Vitals:   01/17/16 1249 01/17/16 1250  BP: 127/84   Pulse: 69 70  Resp:  16  Temp:      Last Pain:  Vitals:   01/17/16 0858  TempSrc: Oral         Complications: No apparent anesthesia complications

## 2016-01-17 NOTE — H&P (Signed)
Michelle Bowen 01/01/2016 10:39 AM Location: [facility] Patient #: [id number] DOB: Jan 12, 1941 Single / Language: Michelle Bowen / Race: Black or African American Female   History of Present Illness [name] MD; 01/02/2016 4:00 PM) Patient words: rt br ca.  The patient is a 75 year old female who presents with breast cancer. PRIOR HISTORY [name] is a post menopausal female referred by [name] for evaluation of recently diagnosed carcinoma of the right breast. She has been able to feel a lump in her right breast for at least a couple of months. She pointed this out to her primary care PA recently and was referred to the breast center for evaluation. Subsequent imaging included diagnostic mamogram showing a suspicious lobulated mass in the upper inner right breast measuring 3.3 cm as well as abnormal right axillary lymph nodes and a second 8 mm mass in the lateral right breast. In the left breast was a 9 mm mass superiorly and an small group of pleomorphic calcifications measuring 5 mm in the upper outer left breast Ultrasound was performed showing a large mass measuring 4.4 cm in the right breast at 10:00 as well as an 8 mm mass that appeared to be a benign cyst. Multiple enlarged lymph nodes were seen as well. An ultrasound guided breast biopsy was performed on December 06, 2015 with pathology revealing invasive mammary carcinoma of the breast with each had here in pending. The mass in the left breast revealed intraductal papilloma. She has not had a biopsy done of the microcalcifications in the left breast. Right axillary lymph node biopsy was positive for metastatic carcinoma. She is seen now in the office for initial treatment planning. She has experienced a breast lump as above but no discharge or skin changes. She has had a previous sebaceous cyst excised from the right breast very near the site of the palpable mass. She has a significant family history of breast cancer in her sister  and both of her sister's daughters all in a young age. She is not taking any hormonal medication.]  Findings at that time were the following: Tumor size: 4.4 cm Tumor grade: 3 Estrogen Receptor: Positive Progesterone Receptor: Negative Her-2 neu: Negative Lymph node status: Positive  Patient had additional biopsy last week. This was negative for cancer and was concordant. 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 Original plan was neoadjuvant chemotherapy, but patient desires to just have surgery. She is not interested at this point in any chemotherapy.     Allergies ([name], CMA; 01/01/2016 10:39 AM) No Known Drug Allergies06/21/2017  Medication History ([name], CMA; 01/01/2016 10:39 AM) Maxzide-25 (37.5-25MG Tablet, Oral) Active. Medications Reconciled    Review of Systems [name] MD; 01/02/2016 4:00 PM) All other systems negative  Vitals ([name] CMA; 01/01/2016 10:40 AM) 01/01/2016 10:40 AM Weight: 173.6 lb Height: 63in Body Surface Area: 1.82 m Body Mass Index: 30.75 kg/m  Temp.: 98.57F(Oral)  Pulse: 69 (Regular)  BP: 160/70 (Sitting, Left Arm, Standard)       Physical Exam [name] MD; 01/02/2016 4:01 PM) General Mental Status-Alert. General Appearance-Consistent with stated age. Hydration-Well hydrated. Voice-Normal.  Head and Neck Head-normocephalic, atraumatic with no lesions or palpable masses.  Eye Sclera/Conjunctiva - Bilateral-No scleral icterus.  Chest and Lung Exam Chest and lung exam reveals -quiet, even and easy respiratory effort with no use of accessory muscles. Inspection Chest Wall - Normal. Back - normal.  Breast Note: palpable 4 cm mass upper outer quadrant right breast. Large palpable axillary lymph  node. no other palpable masses. No skin dimpling. No nipple retraction, no nipple discharge.   Cardiovascular Cardiovascular examination reveals -normal pedal pulses bilaterally. Note:  regular rate and rhythm  Abdomen Inspection-Inspection Normal. Palpation/Percussion Palpation and Percussion of the abdomen reveal - Soft, Non Tender, No Rebound tenderness, No Rigidity (guarding) and No hepatosplenomegaly.  Peripheral Vascular Upper Extremity Inspection - Bilateral - Normal - No Clubbing, No Cyanosis, No Edema, Pulses Intact. Lower Extremity Palpation - Edema - Bilateral - No edema.  Neurologic Neurologic evaluation reveals -alert and oriented x 3 with no impairment of recent or remote memory. Mental Status-Normal.  Musculoskeletal Global Assessment -Note: no gross deformities.  Normal Exam - Left-Upper Extremity Strength Normal and Lower Extremity Strength Normal. Normal Exam - Right-Upper Extremity Strength Normal and Lower Extremity Strength Normal.  Lymphatic Head & Neck  General Head & Neck Lymphatics: Bilateral - Description - Normal. Axillary  General Axillary Region: Bilateral - Description - Normal. Tenderness - Non Tender.    Assessment & Plan [name] MD; 01/02/2016 4:06 PM) STAGE II BREAST CANCER, RIGHT (C50.911) Impression: I discussed with the patient that she will likely have more breast removed with the strategy of up front surgery than with neoadjuvant treatment. She will also need an ALND due to the presence of grossly positive axillary disease. She is amenable to radiation. I also encouraged her to still see Dr. [name] post op in order to discuss chemo again. Even if she just got adjuvant herceptin, that may be beneficial.  I discussed drain placement, overnignt stay, risk of nerve injury, risk of bleeding, infection, seroma, damage to adjacent structures, heart/lung complications, prolonged pain, restricted mobility, lymphedema, etc. She understands and wishes to proceed. I do not think we need a seed on the right since the lesion is clearly palpable. I would also plan to take a small ellipse of skin over the mass as it feels  that there is very little margin between the mass and the skin superficially.  40 min spent in evaluation, examination, counseling, and coordination of care. >50% spent in counseling. Current Plans You are being scheduled for surgery - Our schedulers will call you.  You should hear from our office's scheduling department within 5 working days about the location, date, and time of surgery. We try to make accommodations for patient's preferences in scheduling surgery, but sometimes the OR schedule or the surgeon's schedule prevents [place] from making those accommodations.  If you have not heard from our office [phone]) in 5 working days, call the office and ask for your surgeon's nurse.  If you have other questions about your diagnosis, plan, or surgery, call the office and ask for your surgeon's nurse.  Pt Education - flb breast cancer surgery: discussed with patient and provided information. INTRADUCTAL PAPILLOMA OF BREAST, LEFT (D24.2) Impression: Will plan seed localized excisional biopsy of this area to make sure no associated cancer. Discussed rationale. Reviewed process of seed placement.    Signed by [name], MD (01/02/2016 4:07 PM)

## 2016-01-17 NOTE — Anesthesia Procedure Notes (Signed)
Procedure Name: LMA Insertion Date/Time: 01/17/2016 10:16 AM Performed by: Baxter Flattery Pre-anesthesia Checklist: Patient identified, Emergency Drugs available, Suction available and Patient being monitored Patient Re-evaluated:Patient Re-evaluated prior to inductionOxygen Delivery Method: Circle system utilized Preoxygenation: Pre-oxygenation with 100% oxygen Intubation Type: IV induction Ventilation: Mask ventilation without difficulty LMA: LMA inserted LMA Size: 4.0 Number of attempts: 3 (unable to seat LMA) Airway Equipment and Method: Bite block Placement Confirmation: positive ETCO2 and breath sounds checked- equal and bilateral Tube secured with: Tape Dental Injury: Teeth and Oropharynx as per pre-operative assessment

## 2016-01-17 NOTE — Anesthesia Preprocedure Evaluation (Addendum)
Anesthesia Evaluation  Patient identified by MRN, date of birth, ID band Patient awake    Reviewed: Allergy & Precautions, H&P , NPO status , Patient's Chart, lab work & pertinent test results  History of Anesthesia Complications Negative for: history of anesthetic complications  Airway Mallampati: II   Neck ROM: full    Dental   Pulmonary neg pulmonary ROS, former smoker,    breath sounds clear to auscultation       Cardiovascular hypertension,  Rhythm:regular Rate:Normal     Neuro/Psych negative neurological ROS  negative psych ROS   GI/Hepatic negative GI ROS, Neg liver ROS,   Endo/Other  obesity  Renal/GU negative Renal ROS  negative genitourinary   Musculoskeletal  (+) Arthritis ,   Abdominal   Peds negative pediatric ROS (+)  Hematology negative hematology ROS (+)   Anesthesia Other Findings   Reproductive/Obstetrics negative OB ROS                            Anesthesia Physical Anesthesia Plan  ASA: II  Anesthesia Plan: General   Post-op Pain Management: GA combined w/ Regional for post-op pain   Induction: Intravenous  Airway Management Planned: LMA  Additional Equipment:   Intra-op Plan:   Post-operative Plan: Extubation in OR  Informed Consent: I have reviewed the patients History and Physical, chart, labs and discussed the procedure including the risks, benefits and alternatives for the proposed anesthesia with the patient or authorized representative who has indicated his/her understanding and acceptance.   Dental advisory given  Plan Discussed with: CRNA  Anesthesia Plan Comments:         Anesthesia Quick Evaluation

## 2016-01-17 NOTE — Op Note (Signed)
Left Breast Radioactive seed localized excisional biopsy, right breast lumpectomy, and right axillary lymph node dissection  Indications: This patient presents with history of right breast cancer, upper inner quadrant, cT2N1.  Left side demonstrated a intraductal papilloma on core needle biopsy.  Pre-operative Diagnosis: See ablve  Post-operative Diagnosis: c T2N2 breast cancer on right .  Surgeon: [name]   Anesthesia: General endotracheal anesthesia  ASA Class: 2  Procedure Details  The patient was seen in the Holding Room. The risks, benefits, complications, treatment options, and expected outcomes were discussed with the patient. The possibilities of bleeding, infection, the need for additional procedures, failure to diagnose a condition, and creating a complication requiring transfusion or operation were discussed with the patient. The patient concurred with the proposed plan, giving informed consent.  The site of surgery properly noted/marked. The patient was taken to Operating Room # 7, identified, and the procedure verified as Left Breast Seed localized excisional biopsy, right breast lumpectomy and right axillary lymph node dissection . A Time Out was held and the above information confirmed.  The right arm, breast, and bilateral chest were prepped and draped in standard fashion. The left excisional biopsy was performed by creating a circumareolar incision over the upper areola near the previously placed radioactive seed.  Dissection was carried down to around the point of maximum signal intensity. The cautery was used to perform the dissection.  Hemostasis was achieved with cautery. One clip was placed in the cavity.    The specimen was inked with the margin marker paint kit.    Specimen radiography confirmed inclusion of the mammographic lesion, the clip, and the seed.  The background signal in the breast was zero.  The wound was irrigated and closed with 3-0 vicryl in layers and 4-0  monocryl subcuticular suture.    The right side was then addressed.  The lesion was palpable, and a prior scar was located over the mass.  The skin was excised en bloc with the mass.  Skin hooks were used to assist with creation of flaps.  The mass appeared discrete and was removed with the cautery with grossly negative margins.  The specimen was marked with the margin marker paint kit.  One margin felt close, so additional inferolateral margin was taken.  While obtaining hemostasis, the posterior areola was palpated and had several small nodular firm areas.  These were biopsied.  They represent the anterior margin.  If they are positive, the areola will need to be excised.  Posterior margin is pectoralis muscle.  The wound was irrigated.  Hemostasis was achieved with cautery.    An curvalinear incision was made in the axilla. An axillary dissection was performed with removal of the associated lymph nodes and surrounding adipose tissue. This included levels I and II. This was accomplished by exposing the axillary vein anteriorly and inferiorly to the level of the pectoralis minor and laterally over the latissimus dorsi muscle. Posteriorly, the dissection continued to the subscapularis.  Small venous tributaries, lymphatics, and vessels were clipped and ligated or cauterized and divided. The subscapularis muscle was skeletonized. The long thoracic and thoracodorsal neurovascular bundles were identified and preserved. A 19 Fr Blake drain was placed and secured with a 2-0 nylon.  The wound was irrigated and closed with a 3-0 Vicryl deep dermal interrupted and a 4-0 vicryl subcuticular closure in layers.    Sterile dressings were applied. At the end of the operation, all sponge, instrument, and needle counts were correct.  Findings: grossly clear  surgical margins except for concern over nipple on right.  Grossly matted palpable nodes on right.    Estimated Blood Loss:  min         Specimens: Left breast  seed localized excisional biopsy.  Right breast lumpectomy and right axillary contents         Complications:  None; patient tolerated the procedure well.         Disposition: PACU - hemodynamically stable.         Condition: stable

## 2016-01-17 NOTE — Progress Notes (Signed)
Assisted Dr. Lauretta Grill with right, ultrasound guided, pectoralis block. Side rails up, monitors on throughout procedure. See vital signs in flow sheet. Tolerated Procedure well.

## 2016-01-17 NOTE — Anesthesia Postprocedure Evaluation (Signed)
Anesthesia Post Note  Patient: Tameya L Pecha  Procedure(s) Performed: Procedure(s) (LRB): RADIOACTIVE SEED GUIDED EXCISIONAL LEFT BREAST BIOPSY (Left) RIGHT BREAST LUMPECTOMY WITH AXILLARY LYMPH NODE DISSECTION (Right)  Patient location during evaluation: PACU Anesthesia Type: General Level of consciousness: awake and alert and patient cooperative Pain management: pain level controlled Vital Signs Assessment: post-procedure vital signs reviewed and stable Respiratory status: spontaneous breathing and respiratory function stable Cardiovascular status: stable Anesthetic complications: no    Last Vitals:  Vitals:   01/17/16 1315 01/17/16 1330  BP: (!) 159/74 (!) 154/78  Pulse: 62 64  Resp: 13 15  Temp:      Last Pain:  Vitals:   01/17/16 1330  TempSrc:   PainSc: [place]

## 2016-01-17 NOTE — Interval H&P Note (Signed)
History and Physical Interval Note:  01/17/2016 9:43 AM  Michelle Bowen  has presented today for surgery, with the diagnosis of RIGHT BREAST CANCER, LEFT BREAST ABNORMAL MAMMOGRAM  The various methods of treatment have been discussed with the patient and family. After consideration of risks, benefits and other options for treatment, the patient has consented to  Procedure(s): RADIOACTIVE SEED GUIDED EXCISIONAL LEFT BREAST BIOPSY (Left) RIGHT BREAST LUMPECTOMY WITH AXILLARY LYMPH NODE DISSECTION (Right) as a surgical intervention .  The patient's history has been reviewed, patient examined, no change in status, stable for surgery.  I have reviewed the patient's chart and labs.  Questions were answered to the patient's satisfaction.     Jaeleigh Monaco

## 2016-01-17 NOTE — Discharge Instructions (Signed)

## 2016-01-18 DIAGNOSIS — C50211 Malignant neoplasm of upper-inner quadrant of right female breast: Secondary | ICD-10-CM | POA: Diagnosis not present

## 2016-01-21 ENCOUNTER — Encounter (HOSPITAL_BASED_OUTPATIENT_CLINIC_OR_DEPARTMENT_OTHER): Payer: Self-pay | Admitting: General Surgery

## 2016-01-23 ENCOUNTER — Telehealth: Payer: Self-pay | Admitting: General Surgery

## 2016-01-23 ENCOUNTER — Other Ambulatory Visit: Payer: Self-pay | Admitting: General Surgery

## 2016-01-23 NOTE — Telephone Encounter (Signed)
Discussed pathology with patient and son.  Reviewed that we would need to go back and take additional tissue including the nipple.    I also told them about 11/21 LN positive.  Dr. [name] is going to work her in to talk about path while son is in town.  They may reconsider chemo but do want details about numbers of treatments and side effects.

## 2016-01-24 ENCOUNTER — Ambulatory Visit: Payer: Medicare Other | Admitting: Hematology and Oncology

## 2016-01-25 ENCOUNTER — Ambulatory Visit: Payer: Medicare Other | Admitting: Hematology and Oncology

## 2016-01-28 ENCOUNTER — Ambulatory Visit: Payer: Medicare Other | Admitting: Hematology and Oncology

## 2016-01-28 ENCOUNTER — Ambulatory Visit: Payer: Medicare Other

## 2016-01-28 ENCOUNTER — Other Ambulatory Visit: Payer: Medicare Other

## 2016-01-30 ENCOUNTER — Encounter (HOSPITAL_BASED_OUTPATIENT_CLINIC_OR_DEPARTMENT_OTHER): Payer: Self-pay | Admitting: *Deleted

## 2016-02-01 ENCOUNTER — Telehealth: Payer: Self-pay | Admitting: *Deleted

## 2016-02-01 ENCOUNTER — Encounter (HOSPITAL_BASED_OUTPATIENT_CLINIC_OR_DEPARTMENT_OTHER)
Admission: RE | Admit: 2016-02-01 | Discharge: 2016-02-01 | Disposition: A | Discharge status: Home / Self Care | Payer: Medicare Other | Source: Ambulatory Visit | Attending: General Surgery | Admitting: General Surgery

## 2016-02-01 DIAGNOSIS — Z79899 Other long term (current) drug therapy: Secondary | ICD-10-CM | POA: Diagnosis not present

## 2016-02-01 DIAGNOSIS — C50211 Malignant neoplasm of upper-inner quadrant of right female breast: Secondary | ICD-10-CM | POA: Diagnosis not present

## 2016-02-01 DIAGNOSIS — I10 Essential (primary) hypertension: Secondary | ICD-10-CM | POA: Diagnosis not present

## 2016-02-01 DIAGNOSIS — Z87891 Personal history of nicotine dependence: Secondary | ICD-10-CM | POA: Diagnosis not present

## 2016-02-01 DIAGNOSIS — Z170 Estrogen receptor positive status [ER+]: Secondary | ICD-10-CM | POA: Diagnosis not present

## 2016-02-01 DIAGNOSIS — L7634 Postprocedural seroma of skin and subcutaneous tissue following other procedure: Secondary | ICD-10-CM | POA: Diagnosis not present

## 2016-02-01 DIAGNOSIS — C50011 Malignant neoplasm of nipple and areola, right female breast: Secondary | ICD-10-CM | POA: Diagnosis present

## 2016-02-01 DIAGNOSIS — Y836 Removal of other organ (partial) (total) as the cause of abnormal reaction of the patient, or of later complication, without mention of misadventure at the time of the procedure: Secondary | ICD-10-CM | POA: Diagnosis not present

## 2016-02-01 LAB — BASIC METABOLIC PANEL
Anion gap: 9 (ref 5–15)
BUN: 10 mg/dL (ref 6–20)
CALCIUM: 9.6 mg/dL (ref 8.9–10.3)
CHLORIDE: 100 mmol/L — AB (ref 101–111)
CO2: 29 mmol/L (ref 22–32)
CREATININE: 0.95 mg/dL (ref 0.44–1.00)
GFR, EST NON AFRICAN AMERICAN: 58 mL/min — AB (ref >60)
Glucose, Bld: 96 mg/dL (ref 65–99)
Potassium: 3.4 mmol/L — ABNORMAL LOW (ref 3.5–5.1)
SODIUM: 138 mmol/L (ref 135–145)

## 2016-02-01 NOTE — Pre-Procedure Instructions (Signed)
Patient given Boost to drink by 8:00 DOS and voiced understanding

## 2016-02-01 NOTE — Telephone Encounter (Signed)
Left message for a return phone call to schedule appt with Dr. [name].

## 2016-02-05 ENCOUNTER — Ambulatory Visit (HOSPITAL_BASED_OUTPATIENT_CLINIC_OR_DEPARTMENT_OTHER): Payer: Medicare Other | Admitting: Certified Registered"

## 2016-02-05 ENCOUNTER — Encounter (HOSPITAL_BASED_OUTPATIENT_CLINIC_OR_DEPARTMENT_OTHER): Payer: Self-pay | Admitting: Certified Registered"

## 2016-02-05 ENCOUNTER — Ambulatory Visit (HOSPITAL_BASED_OUTPATIENT_CLINIC_OR_DEPARTMENT_OTHER)
Admission: RE | Admit: 2016-02-05 | Discharge: 2016-02-05 | Disposition: A | Discharge status: Home / Self Care | Payer: Medicare Other | Source: Ambulatory Visit | Attending: General Surgery | Admitting: General Surgery

## 2016-02-05 ENCOUNTER — Encounter (HOSPITAL_BASED_OUTPATIENT_CLINIC_OR_DEPARTMENT_OTHER)
Admission: RE | Disposition: A | Discharge status: Home / Self Care | Payer: Self-pay | Source: Ambulatory Visit | Attending: General Surgery

## 2016-02-05 DIAGNOSIS — Z79899 Other long term (current) drug therapy: Secondary | ICD-10-CM | POA: Insufficient documentation

## 2016-02-05 DIAGNOSIS — L7634 Postprocedural seroma of skin and subcutaneous tissue following other procedure: Secondary | ICD-10-CM | POA: Diagnosis not present

## 2016-02-05 DIAGNOSIS — C50011 Malignant neoplasm of nipple and areola, right female breast: Secondary | ICD-10-CM | POA: Diagnosis not present

## 2016-02-05 DIAGNOSIS — Z170 Estrogen receptor positive status [ER+]: Secondary | ICD-10-CM | POA: Diagnosis not present

## 2016-02-05 DIAGNOSIS — Z87891 Personal history of nicotine dependence: Secondary | ICD-10-CM | POA: Insufficient documentation

## 2016-02-05 DIAGNOSIS — C50211 Malignant neoplasm of upper-inner quadrant of right female breast: Secondary | ICD-10-CM | POA: Diagnosis not present

## 2016-02-05 DIAGNOSIS — Y836 Removal of other organ (partial) (total) as the cause of abnormal reaction of the patient, or of later complication, without mention of misadventure at the time of the procedure: Secondary | ICD-10-CM | POA: Insufficient documentation

## 2016-02-05 DIAGNOSIS — I10 Essential (primary) hypertension: Secondary | ICD-10-CM | POA: Insufficient documentation

## 2016-02-05 HISTORY — PX: RE-EXCISION OF BREAST LUMPECTOMY: SHX6048

## 2016-02-05 SURGERY — EXCISION, LESION, BREAST
Anesthesia: General | Site: Breast | Laterality: Right

## 2016-02-05 MED ORDER — FENTANYL CITRATE (PF) 100 MCG/2ML IJ SOLN
INTRAMUSCULAR | Status: DC | PRN
  Administered 2016-02-05 (×3): 25 ug via INTRAVENOUS

## 2016-02-05 MED ORDER — SODIUM CHLORIDE 0.9% FLUSH: 3.0000 mL | Freq: Two times a day (BID) | INTRAVENOUS | Status: DC

## 2016-02-05 MED ORDER — ONDANSETRON HCL 4 MG/2ML IJ SOLN
INTRAMUSCULAR | Status: DC | PRN
  Administered 2016-02-05: 4 mg via INTRAVENOUS

## 2016-02-05 MED ORDER — PHENYLEPHRINE 40 MCG/ML (10ML) SYRINGE FOR IV PUSH (FOR BLOOD PRESSURE SUPPORT)
PREFILLED_SYRINGE | INTRAVENOUS | Status: AC
  Filled 2016-02-05: qty 10

## 2016-02-05 MED ORDER — CHLORHEXIDINE GLUCONATE CLOTH 2 % EX PADS: 6.0000 | MEDICATED_PAD | Freq: Once | CUTANEOUS | Status: DC

## 2016-02-05 MED ORDER — HYDROMORPHONE HCL 1 MG/ML IJ SOLN
INTRAMUSCULAR | Status: AC
  Filled 2016-02-05: qty 1

## 2016-02-05 MED ORDER — SODIUM CHLORIDE 0.9 % IV SOLN: 250.0000 mL | INTRAVENOUS | Status: DC | PRN

## 2016-02-05 MED ORDER — ACETAMINOPHEN 650 MG RE SUPP: 650.0000 mg | RECTAL | Status: DC | PRN

## 2016-02-05 MED ORDER — HYDROMORPHONE HCL 1 MG/ML IJ SOLN
0.2500 mg | INTRAMUSCULAR | Status: DC | PRN
  Administered 2016-02-05: 0.25 mg via INTRAVENOUS
  Administered 2016-02-05: 0.5 mg via INTRAVENOUS

## 2016-02-05 MED ORDER — SODIUM CHLORIDE 0.9% FLUSH: 3.0000 mL | INTRAVENOUS | Status: DC | PRN

## 2016-02-05 MED ORDER — PHENYLEPHRINE HCL 10 MG/ML IJ SOLN
INTRAMUSCULAR | Status: DC | PRN
  Administered 2016-02-05: 40 ug via INTRAVENOUS
  Administered 2016-02-05: 80 ug via INTRAVENOUS

## 2016-02-05 MED ORDER — MIDAZOLAM HCL 2 MG/2ML IJ SOLN
INTRAMUSCULAR | Status: AC
  Filled 2016-02-05: qty 2

## 2016-02-05 MED ORDER — ONDANSETRON HCL 4 MG/2ML IJ SOLN
INTRAMUSCULAR | Status: AC
  Filled 2016-02-05: qty 2

## 2016-02-05 MED ORDER — DEXAMETHASONE SODIUM PHOSPHATE 10 MG/ML IJ SOLN
INTRAMUSCULAR | Status: AC
  Filled 2016-02-05: qty 1

## 2016-02-05 MED ORDER — LACTATED RINGERS IV SOLN
INTRAVENOUS | Status: DC
  Administered 2016-02-05 (×2): via INTRAVENOUS

## 2016-02-05 MED ORDER — LIDOCAINE-EPINEPHRINE (PF) 1 %-1:200000 IJ SOLN
INTRAMUSCULAR | Status: DC | PRN
  Administered 2016-02-05: 20 mL

## 2016-02-05 MED ORDER — FENTANYL CITRATE (PF) 100 MCG/2ML IJ SOLN
INTRAMUSCULAR | Status: AC
  Filled 2016-02-05: qty 2

## 2016-02-05 MED ORDER — CEFAZOLIN SODIUM-DEXTROSE 2-4 GM/100ML-% IV SOLN
2.0000 g | INTRAVENOUS | Status: AC
  Administered 2016-02-05: 2 g via INTRAVENOUS

## 2016-02-05 MED ORDER — LIDOCAINE 2% (20 MG/ML) 5 ML SYRINGE
INTRAMUSCULAR | Status: AC
  Filled 2016-02-05: qty 5

## 2016-02-05 MED ORDER — EPHEDRINE SULFATE 50 MG/ML IJ SOLN
INTRAMUSCULAR | Status: AC
  Filled 2016-02-05: qty 1

## 2016-02-05 MED ORDER — SODIUM CHLORIDE 0.9 % IJ SOLN
INTRAMUSCULAR | Status: AC
  Filled 2016-02-05: qty 10

## 2016-02-05 MED ORDER — DEXAMETHASONE SODIUM PHOSPHATE 4 MG/ML IJ SOLN
INTRAMUSCULAR | Status: DC | PRN
  Administered 2016-02-05: 10 mg via INTRAVENOUS

## 2016-02-05 MED ORDER — LIDOCAINE HCL (CARDIAC) 20 MG/ML IV SOLN
INTRAVENOUS | Status: DC | PRN
  Administered 2016-02-05: 50 mg via INTRAVENOUS

## 2016-02-05 MED ORDER — GLYCOPYRROLATE 0.2 MG/ML IJ SOLN: 0.2000 mg | Freq: Once | INTRAMUSCULAR | Status: DC | PRN

## 2016-02-05 MED ORDER — FENTANYL CITRATE (PF) 100 MCG/2ML IJ SOLN: 50.0000 ug | INTRAMUSCULAR | Status: DC | PRN

## 2016-02-05 MED ORDER — ACETAMINOPHEN 325 MG PO TABS: 650.0000 mg | ORAL_TABLET | ORAL | Status: DC | PRN

## 2016-02-05 MED ORDER — OXYCODONE HCL 5 MG PO TABS
5.0000 mg | ORAL_TABLET | ORAL | Status: DC | PRN
Start: 2016-02-05 — End: 2016-02-05

## 2016-02-05 MED ORDER — CEFAZOLIN SODIUM-DEXTROSE 2-4 GM/100ML-% IV SOLN
INTRAVENOUS | Status: AC
  Filled 2016-02-05: qty 100

## 2016-02-05 MED ORDER — PROPOFOL 10 MG/ML IV BOLUS
INTRAVENOUS | Status: DC | PRN
  Administered 2016-02-05: 200 mg via INTRAVENOUS

## 2016-02-05 MED ORDER — SCOPOLAMINE 1 MG/3DAYS TD PT72: 1.0000 | MEDICATED_PATCH | Freq: Once | TRANSDERMAL | Status: DC | PRN

## 2016-02-05 MED ORDER — MIDAZOLAM HCL 2 MG/2ML IJ SOLN: 1.0000 mg | INTRAMUSCULAR | Status: DC | PRN

## 2016-02-05 MED ORDER — EPHEDRINE SULFATE 50 MG/ML IJ SOLN
INTRAMUSCULAR | Status: DC | PRN
  Administered 2016-02-05: 10 mg via INTRAVENOUS

## 2016-02-05 SURGICAL SUPPLY — 58 items
ADH SKN CLS APL DERMABOND .7 (GAUZE/BANDAGES/DRESSINGS) ×1
BINDER BREAST 3XL (BIND) IMPLANT
BINDER BREAST LRG (GAUZE/BANDAGES/DRESSINGS) IMPLANT
BINDER BREAST MEDIUM (GAUZE/BANDAGES/DRESSINGS) IMPLANT
BINDER BREAST XLRG (GAUZE/BANDAGES/DRESSINGS) ×2 IMPLANT
BINDER BREAST XXLRG (GAUZE/BANDAGES/DRESSINGS) IMPLANT
BLADE SURG 15 STRL LF DISP TIS (BLADE) ×1 IMPLANT
BLADE SURG 15 STRL SS (BLADE) ×3
CANISTER SUCT 1200ML W/VALVE (MISCELLANEOUS) ×3 IMPLANT
CHLORAPREP W/TINT 26ML (MISCELLANEOUS) ×3 IMPLANT
CLIP TI LARGE 6 (CLIP) IMPLANT
CLOSURE WOUND 1/2 X4 (GAUZE/BANDAGES/DRESSINGS) ×1
COVER BACK TABLE 60X90IN (DRAPES) ×3 IMPLANT
COVER MAYO STAND STRL (DRAPES) ×3 IMPLANT
DECANTER SPIKE VIAL GLASS SM (MISCELLANEOUS) IMPLANT
DERMABOND ADVANCED (GAUZE/BANDAGES/DRESSINGS) ×2
DERMABOND ADVANCED .7 DNX12 (GAUZE/BANDAGES/DRESSINGS) IMPLANT
DRAPE LAPAROTOMY 100X72 PEDS (DRAPES) ×3 IMPLANT
DRAPE UTILITY XL STRL (DRAPES) ×3 IMPLANT
DRSG PAD ABDOMINAL 8X10 ST (GAUZE/BANDAGES/DRESSINGS) ×2 IMPLANT
ELECT COATED BLADE 2.86 ST (ELECTRODE) ×3 IMPLANT
ELECT REM PT RETURN 9FT ADLT (ELECTROSURGICAL) ×3
ELECTRODE REM PT RTRN 9FT ADLT (ELECTROSURGICAL) ×1 IMPLANT
GAUZE SPONGE 4X4 12PLY STRL (GAUZE/BANDAGES/DRESSINGS) IMPLANT
GLOVE BIO SURGEON STRL SZ 6 (GLOVE) ×3 IMPLANT
GLOVE BIO SURGEON STRL SZ7.5 (GLOVE) ×2 IMPLANT
GLOVE BIOGEL PI IND STRL 6.5 (GLOVE) ×1 IMPLANT
GLOVE BIOGEL PI IND STRL 8 (GLOVE) IMPLANT
GLOVE BIOGEL PI INDICATOR 6.5 (GLOVE) ×4
GLOVE BIOGEL PI INDICATOR 8 (GLOVE) ×2
GOWN STRL REUS W/ TWL LRG LVL3 (GOWN DISPOSABLE) ×1 IMPLANT
GOWN STRL REUS W/TWL 2XL LVL3 (GOWN DISPOSABLE) ×3 IMPLANT
GOWN STRL REUS W/TWL LRG LVL3 (GOWN DISPOSABLE) ×3
KIT MARKER MARGIN INK (KITS) IMPLANT
LIQUID BAND (GAUZE/BANDAGES/DRESSINGS) ×1 IMPLANT
NDL HYPO 25X1 1.5 SAFETY (NEEDLE) ×1 IMPLANT
NEEDLE HYPO 25X1 1.5 SAFETY (NEEDLE) ×3 IMPLANT
NS IRRIG 1000ML POUR BTL (IV SOLUTION) ×3 IMPLANT
PACK BASIN DAY SURGERY FS (CUSTOM PROCEDURE TRAY) ×3 IMPLANT
PENCIL BUTTON HOLSTER BLD 10FT (ELECTRODE) ×3 IMPLANT
SLEEVE SCD COMPRESS KNEE MED (MISCELLANEOUS) ×3 IMPLANT
SPONGE GAUZE 4X4 12PLY STER LF (GAUZE/BANDAGES/DRESSINGS) ×3 IMPLANT
SPONGE LAP 18X18 X RAY DECT (DISPOSABLE) ×3 IMPLANT
STAPLER VISISTAT 35W (STAPLE) IMPLANT
STRIP CLOSURE SKIN 1/2X4 (GAUZE/BANDAGES/DRESSINGS) ×2 IMPLANT
SUT MON AB 4-0 PC3 18 (SUTURE) ×3 IMPLANT
SUT SILK 2 0 SH (SUTURE) IMPLANT
SUT VIC AB 3-0 54X BRD REEL (SUTURE) IMPLANT
SUT VIC AB 3-0 BRD 54 (SUTURE)
SUT VIC AB 3-0 SH 27 (SUTURE) ×6
SUT VIC AB 3-0 SH 27X BRD (SUTURE) ×1 IMPLANT
SYR BULB 3OZ (MISCELLANEOUS) ×3 IMPLANT
SYR CONTROL 10ML LL (SYRINGE) ×3 IMPLANT
TOWEL OR 17X24 6PK STRL BLUE (TOWEL DISPOSABLE) ×3 IMPLANT
TOWEL OR NON WOVEN STRL DISP B (DISPOSABLE) ×1 IMPLANT
TUBE CONNECTING 20'X1/4 (TUBING) ×1
TUBE CONNECTING 20X1/4 (TUBING) ×2 IMPLANT
YANKAUER SUCT BULB TIP NO VENT (SUCTIONS) ×3 IMPLANT

## 2016-02-05 NOTE — Anesthesia Preprocedure Evaluation (Addendum)
Anesthesia Evaluation  Patient identified by MRN, date of birth, ID band Patient awake    Reviewed: Allergy & Precautions, H&P , NPO status , Patient's Chart, lab work & pertinent test results  Airway Mallampati: II  TM Distance: >3 FB Neck ROM: Full    Dental no notable dental hx. (+) Teeth Intact, Dental Advisory Given   Pulmonary neg pulmonary ROS, former smoker,    Pulmonary exam normal breath sounds clear to auscultation       Cardiovascular hypertension, Pt. on medications  Rhythm:Regular Rate:Normal     Neuro/Psych negative neurological ROS  negative psych ROS   GI/Hepatic negative GI ROS, Neg liver ROS,   Endo/Other  negative endocrine ROS  Renal/GU negative Renal ROS  negative genitourinary   Musculoskeletal   Abdominal   Peds  Hematology negative hematology ROS (+)   Anesthesia Other Findings   Reproductive/Obstetrics negative OB ROS                            Anesthesia Physical Anesthesia Plan  ASA: II  Anesthesia Plan: General   Post-op Pain Management:    Induction: Intravenous  Airway Management Planned: LMA  Additional Equipment:   Intra-op Plan:   Post-operative Plan: Extubation in OR  Informed Consent: I have reviewed the patients History and Physical, chart, labs and discussed the procedure including the risks, benefits and alternatives for the proposed anesthesia with the patient or authorized representative who has indicated his/her understanding and acceptance.   Dental advisory given  Plan Discussed with: CRNA  Anesthesia Plan Comments:         Anesthesia Quick Evaluation

## 2016-02-05 NOTE — Anesthesia Procedure Notes (Signed)
Procedure Name: LMA Insertion Date/Time: 02/05/2016 12:08 PM Performed by: Toula Moos L Pre-anesthesia Checklist: Patient identified, Emergency Drugs available, Suction available, Patient being monitored and Timeout performed Patient Re-evaluated:Patient Re-evaluated prior to inductionOxygen Delivery Method: Circle system utilized Preoxygenation: Pre-oxygenation with 100% oxygen Intubation Type: IV induction Ventilation: Mask ventilation without difficulty LMA: LMA inserted LMA Size: 4.0 Number of attempts: 1 Airway Equipment and Method: Bite block Placement Confirmation: positive ETCO2 Tube secured with: Tape Dental Injury: Teeth and Oropharynx as per pre-operative assessment

## 2016-02-05 NOTE — Discharge Instructions (Addendum)
[facility] °Office Phone Number [phone] ° °BREAST BIOPSY/ PARTIAL MASTECTOMY: POST OP INSTRUCTIONS ° °Always review your discharge instruction sheet given to you by the facility where your surgery was performed. ° °IF YOU HAVE DISABILITY OR FAMILY LEAVE FORMS, YOU MUST BRING THEM TO THE OFFICE FOR PROCESSING.  DO NOT GIVE THEM TO YOUR DOCTOR. ° °1. A prescription for pain medication may be given to you upon discharge.  Take your pain medication as prescribed, if needed.  If narcotic pain medicine is not needed, then you may take acetaminophen (Tylenol) or ibuprofen (Advil) as needed. °2. Take your usually prescribed medications unless otherwise directed °3. If you need a refill on your pain medication, please contact your pharmacy.  They will contact our office to request authorization.  Prescriptions will not be filled after 5pm or on week-ends. °4. You should eat very light the first 24 hours after surgery, such as soup, crackers, pudding, etc.  Resume your normal diet the day after surgery. °5. Most patients will experience some swelling and bruising in the breast.  Ice packs and a good support bra will help.  Swelling and bruising can take several days to resolve.  °6. It is common to experience some constipation if taking pain medication after surgery.  Increasing fluid intake and taking a stool softener will usually help or prevent this problem from occurring.  A mild laxative (Milk of Magnesia or Miralax) should be taken according to package directions if there are no bowel movements after 48 hours. °7. Unless discharge instructions indicate otherwise, you may remove your bandages 48 hours after surgery, and you may shower at that time.  You may have steri-strips (small skin tapes) in place directly over the incision.  These strips should be left on the skin for 7-10 days.   Any sutures or staples will be removed at the office during your follow-up visit. °8. ACTIVITIES:  You may resume  regular daily activities (gradually increasing) beginning the next day.  Wearing a good support bra or sports bra (or the breast binder) minimizes pain and swelling.  You may have sexual intercourse when it is comfortable. °a. You may drive when you no longer are taking prescription pain medication, you can comfortably wear a seatbelt, and you can safely maneuver your car and apply brakes. °b. RETURN TO WORK:  __________1 week_______________ °9. You should see your doctor in the office for a follow-up appointment approximately two weeks after your surgery.  Your doctor’s nurse will typically make your follow-up appointment when she calls you with your pathology report.  Expect your pathology report 2-3 business days after your surgery.  You may call to check if you do not hear from us after three days. ° ° °WHEN TO CALL YOUR DOCTOR: °1. Fever over 101.0 °2. Nausea and/or vomiting. °3. Extreme swelling or bruising. °4. Continued bleeding from incision. °5. Increased pain, redness, or drainage from the incision. ° °The clinic staff is available to answer your questions during regular business hours.  Please don’t hesitate to call and ask to speak to one of the nurses for clinical concerns.  If you have a medical emergency, go to the nearest emergency room or call 911.  A surgeon from [facility] is always on call at the hospital. ° °For further questions, please visit [email or web address]  ° ° °Post Anesthesia Home Care Instructions ° °Activity: °Get plenty of rest for the remainder of the day. A responsible adult should stay with you for 24   hours following the procedure.  °For the next 24 hours, DO NOT: °-Drive a car °-Operate machinery °-Drink alcoholic beverages °-Take any medication unless instructed by your physician °-Make any legal decisions or sign important papers. ° °Meals: °Start with liquid foods such as gelatin or soup. Progress to regular foods as tolerated. Avoid greasy, spicy, heavy  foods. If nausea and/or vomiting occur, drink only clear liquids until the nausea and/or vomiting subsides. Call your physician if vomiting continues. ° °Special Instructions/Symptoms: °Your throat may feel dry or sore from the anesthesia or the breathing tube placed in your throat during surgery. If this causes discomfort, gargle with warm salt water. The discomfort should disappear within 24 hours. ° °If you had a scopolamine patch placed behind your ear for the management of post- operative nausea and/or vomiting: ° °1. The medication in the patch is effective for 72 hours, after which it should be removed.  Wrap patch in a tissue and discard in the trash. Wash hands thoroughly with soap and water. °2. You may remove the patch earlier than 72 hours if you experience unpleasant side effects which may include dry mouth, dizziness or visual disturbances. °3. Avoid touching the patch. Wash your hands with soap and water after contact with the patch. °  ° °

## 2016-02-05 NOTE — Interval H&P Note (Signed)
History and Physical Interval Note:  02/05/2016 11:56 AM  Michelle Bowen  has presented today for surgery, with the diagnosis of RIGHT BREAST CANCER  The various methods of treatment have been discussed with the patient and family. After consideration of risks, benefits and other options for treatment, the patient has consented to  Procedure(s): RE-EXCISION OF BREAST LUMPECTOMY AND REMOVAL OF NIPPLE (Right) as a surgical intervention .  The patient's history has been reviewed, patient examined, no change in status, stable for surgery.  I have reviewed the patient's chart and labs.  Questions were answered to the patient's satisfaction.     Cassiel Fernandez

## 2016-02-05 NOTE — Op Note (Signed)
Re-excisional right Breast Lumpectomy   Indications: This patient presents with history of positive anterior margins after partial mastectomy for right breast cancer   Pre-operative Diagnosis: right breast cancer, positive margin at the nipple  Post-operative Diagnosis: right breast cancer   Surgeon: [name]   Assistants: n/a   Anesthesia: General anesthesia and Local anesthesia 0.25% bupivacaine, with epinephrine   ASA Class: 2   Procedure Details  The patient was seen in the Holding Room. The risks, benefits, complications, treatment options, and expected outcomes were discussed with the patient. The possibilities of reaction to medication, pulmonary aspiration, bleeding, infection, the need for additional procedures, failure to diagnose a condition, and creating a complication requiring transfusion or operation were discussed with the patient. The patient concurred with the proposed plan, giving informed consent. The site of surgery properly noted/marked. The patient was taken to Operating Room # 8, identified, and the procedure verified as re-excision of right breast cancer with possible nipple excision  After induction of anesthesia, the right breast and chest were prepped and draped in standard fashion.  The lumpectomy was performed by making an incision around the nipple. Seroma was aspirated.  The nipple still had firm areas underneath, and the nipple was removed in its entirety.  The nipple was too far from the original incision to get a good margin otherwise.   Orientation sutures were placed in the specimens. Hemostasis was achieved with cautery. The wound was irrigated and closed with a 3-0 Vicryl deep dermal interrupted and a 4-0 Monocryl subcuticular closure in layers.  Sterile dressings were applied. At the end of the operation, all sponge, instrument, and needle counts were correct.   Findings:  grossly clear surgical margins  Estimated Blood Loss: Minimal   Drains:  none   Specimens: additional anterior margins. (superior and inferior nipple)  Complications: None; patient tolerated the procedure well.   Disposition: PACU - hemodynamically stable.   Condition: stable

## 2016-02-05 NOTE — Transfer of Care (Signed)
Immediate Anesthesia Transfer of Care Note  Patient: Michelle Bowen  Procedure(s) Performed: Procedure(s): RE-EXCISION OF BREAST LUMPECTOMY AND REMOVAL OF NIPPLE (Right)  Patient Location: PACU  Anesthesia Type:General  Level of Consciousness: awake and patient cooperative  Airway & Oxygen Therapy: Patient Spontanous Breathing and Patient connected to face mask oxygen  Post-op Assessment: Report given to RN and Post -op Vital signs reviewed and stable  Post vital signs: Reviewed and stable  Last Vitals:  Vitals:   02/05/16 0940  BP: 135/63  Pulse: 64  Resp: 16  Temp: 36.8 C    Last Pain:  Vitals:   02/05/16 0940  TempSrc: Oral  PainSc:       Patients Stated Pain Goal: 2 ([id number])  Complications: No apparent anesthesia complications

## 2016-02-05 NOTE — Anesthesia Postprocedure Evaluation (Signed)
Anesthesia Post Note  Patient: Dellia L Dedic  Procedure(s) Performed: Procedure(s) (LRB): RE-EXCISION OF BREAST LUMPECTOMY AND REMOVAL OF NIPPLE (Right)  Patient location during evaluation: PACU Anesthesia Type: General Level of consciousness: awake and alert Pain management: pain level controlled Vital Signs Assessment: post-procedure vital signs reviewed and stable Respiratory status: spontaneous breathing, nonlabored ventilation and respiratory function stable Cardiovascular status: blood pressure returned to baseline and stable Postop Assessment: no signs of nausea or vomiting Anesthetic complications: no    Last Vitals:  Vitals:   02/05/16 1345 02/05/16 1400  BP: 140/67 131/70  Pulse: 67 70  Resp: 15 18  Temp:      Last Pain:  Vitals:   02/05/16 1400  TempSrc:   PainSc: 1                  Bohden Dung A

## 2016-02-05 NOTE — H&P (View-Only) (Signed)
Michelle Bowen 01/01/2016 10:39 AM Location: [facility] Patient #: [id number] DOB: 07/23/1940 Single / Language: English / Race: Black or African American Female   History of Present Illness (Khani Paino MD; 01/02/2016 4:00 PM) Patient words: rt br ca.  The patient is a 75 year old female who presents with breast cancer. PRIOR HISTORY [She is a post menopausal female referred by Dr. Lin for evaluation of recently diagnosed carcinoma of the right breast. She has been able to feel a lump in her right breast for at least a couple of months. She pointed this out to her primary care PA recently and was referred to the breast center for evaluation. Subsequent imaging included diagnostic mamogram showing a suspicious lobulated mass in the upper inner right breast measuring 3.3 cm as well as abnormal right axillary lymph nodes and a second 8 mm mass in the lateral right breast. In the left breast was a 9 mm mass superiorly and an small group of pleomorphic calcifications measuring 5 mm in the upper outer left breast Ultrasound was performed showing a large mass measuring 4.4 cm in the right breast at 10:00 as well as an 8 mm mass that appeared to be a benign cyst. Multiple enlarged lymph nodes were seen as well. An ultrasound guided breast biopsy was performed on December 06, 2015 with pathology revealing invasive mammary carcinoma of the breast with each had here in pending. The mass in the left breast revealed intraductal papilloma. She has not had a biopsy done of the microcalcifications in the left breast. Right axillary lymph node biopsy was positive for metastatic carcinoma. She is seen now in the office for initial treatment planning. She has experienced a breast lump as above but no discharge or skin changes. She has had a previous sebaceous cyst excised from the right breast very near the site of the palpable mass. She has a significant family history of breast cancer in her sister  and both of her sister's daughters all in a young age. She is not taking any hormonal medication.]  Findings at that time were the following: Tumor size: 4.4 cm Tumor grade: 3 Estrogen Receptor: Positive Progesterone Receptor: Negative Her-2 neu: Negative Lymph node status: Positive  Patient had additional biopsy last week. This was negative for cancer and was concordant. She desired female surgeon. Original plan was neoadjuvant chemotherapy, but patient desires to just have surgery. She is not interested at this point in any chemotherapy.     Allergies ([name], CMA; 01/01/2016 10:39 AM) No Known Drug Allergies06/21/2017  Medication History ([name], CMA; 01/01/2016 10:39 AM) Maxzide-25 (37.5-25MG Tablet, Oral) Active. Medications Reconciled    Review of Systems (Adaleigh Warf MD; 01/02/2016 4:00 PM) All other systems negative  Vitals ([name] CMA; 01/01/2016 10:40 AM) 01/01/2016 10:40 AM Weight: 173.6 lb Height: 63in Body Surface Area: 1.82 m Body Mass Index: 30.75 kg/m  Temp.: 98.6F(Oral)  Pulse: 69 (Regular)  BP: 160/70 (Sitting, Left Arm, Standard)       Physical Exam (Jazmyne Beauchesne MD; 01/02/2016 4:01 PM) General Mental Status-Alert. General Appearance-Consistent with stated age. Hydration-Well hydrated. Voice-Normal.  Head and Neck Head-normocephalic, atraumatic with no lesions or palpable masses.  Eye Sclera/Conjunctiva - Bilateral-No scleral icterus.  Chest and Lung Exam Chest and lung exam reveals -quiet, even and easy respiratory effort with no use of accessory muscles. Inspection Chest Wall - Normal. Back - normal.  Breast Note: palpable 4 cm mass upper outer quadrant right breast. Large palpable axillary lymph   node. no other palpable masses. No skin dimpling. No nipple retraction, no nipple discharge.   Cardiovascular Cardiovascular examination reveals -normal pedal pulses bilaterally. Note:  regular rate and rhythm  Abdomen Inspection-Inspection Normal. Palpation/Percussion Palpation and Percussion of the abdomen reveal - Soft, Non Tender, No Rebound tenderness, No Rigidity (guarding) and No hepatosplenomegaly.  Peripheral Vascular Upper Extremity Inspection - Bilateral - Normal - No Clubbing, No Cyanosis, No Edema, Pulses Intact. Lower Extremity Palpation - Edema - Bilateral - No edema.  Neurologic Neurologic evaluation reveals -alert and oriented x 3 with no impairment of recent or remote memory. Mental Status-Normal.  Musculoskeletal Global Assessment -Note: no gross deformities.  Normal Exam - Left-Upper Extremity Strength Normal and Lower Extremity Strength Normal. Normal Exam - Right-Upper Extremity Strength Normal and Lower Extremity Strength Normal.  Lymphatic Head & Neck  General Head & Neck Lymphatics: Bilateral - Description - Normal. Axillary  General Axillary Region: Bilateral - Description - Normal. Tenderness - Non Tender.    Assessment & Plan (Verlie Liotta MD; 01/02/2016 4:06 PM) STAGE II BREAST CANCER, RIGHT (C50.911) Impression: I discussed with the patient that she will likely have more breast removed with the strategy of up front surgery than with neoadjuvant treatment. She will also need an ALND due to the presence of grossly positive axillary disease. She is amenable to radiation. I also encouraged her to still see Dr. Gudena post op in order to discuss chemo again. Even if she just got adjuvant herceptin, that may be beneficial.  I discussed drain placement, overnignt stay, risk of nerve injury, risk of bleeding, infection, seroma, damage to adjacent structures, heart/lung complications, prolonged pain, restricted mobility, lymphedema, etc. She understands and wishes to proceed. I do not think we need a seed on the right since the lesion is clearly palpable. I would also plan to take a small ellipse of skin over the mass as it feels  that there is very little margin between the mass and the skin superficially.  40 min spent in evaluation, examination, counseling, and coordination of care. >50% spent in counseling. Current Plans You are being scheduled for surgery - Our schedulers will call you.  You should hear from our office's scheduling department within 5 working days about the location, date, and time of surgery. We try to make accommodations for patient's preferences in scheduling surgery, but sometimes the OR schedule or the surgeon's schedule prevents us from making those accommodations.  If you have not heard from our office ([phone]) in 5 working days, call the office and ask for your surgeon's nurse.  If you have other questions about your diagnosis, plan, or surgery, call the office and ask for your surgeon's nurse.  Pt Education - [facility] breast cancer surgery: discussed with patient and provided information. INTRADUCTAL PAPILLOMA OF BREAST, LEFT (D24.2) Impression: Will plan seed localized excisional biopsy of this area to make sure no associated cancer. Discussed rationale. Reviewed process of seed placement.    Signed by Nellie Pester, MD (01/02/2016 4:07 PM) 

## 2016-02-06 ENCOUNTER — Encounter (HOSPITAL_BASED_OUTPATIENT_CLINIC_OR_DEPARTMENT_OTHER): Payer: Self-pay | Admitting: General Surgery

## 2016-02-13 ENCOUNTER — Encounter: Payer: Self-pay | Admitting: Genetic Counselor

## 2016-02-14 ENCOUNTER — Telehealth: Payer: Self-pay | Admitting: *Deleted

## 2016-02-14 NOTE — Telephone Encounter (Signed)
Discussed with pt need for appt with Dr. [name] after xrt. Received verbal understanding. Confirmed appt on 11/24. Denies further needs or questions at this time

## 2016-02-14 NOTE — Telephone Encounter (Signed)
Received msg pt refused appt and not interested in chemotherapy. We emphasized need for f/u appts. Per Dr. [name] he would like to see her after radiation. Left vm with pt to return call to discuss new appt after xrt. Contact information provided

## 2016-02-18 ENCOUNTER — Ambulatory Visit: Payer: Self-pay | Admitting: Radiation Oncology

## 2016-02-18 ENCOUNTER — Ambulatory Visit: Payer: Medicare Other | Admitting: Hematology and Oncology

## 2016-02-18 ENCOUNTER — Other Ambulatory Visit: Payer: Medicare Other

## 2016-02-18 ENCOUNTER — Ambulatory Visit: Payer: Medicare Other

## 2016-02-28 ENCOUNTER — Telehealth (HOSPITAL_COMMUNITY): Payer: Self-pay | Admitting: Vascular Surgery

## 2016-02-28 NOTE — Telephone Encounter (Signed)
Left pt message to make NP APPT W/ ECHO

## 2016-02-28 NOTE — Telephone Encounter (Signed)
Spoke to pt to make f/u NP brst appt w/ echo, pt states she not going to do another echo and refused to see cardiology

## 2016-03-03 NOTE — Telephone Encounter (Signed)
Forwarded message to cancer center

## 2016-03-03 NOTE — Progress Notes (Signed)
Mrs. Michelle Bowen. Bushnell  is here for a  follow up  new visit for the right breast cancer.    Diagnosis 02/05/2016:Dr. [name] ,MD : 1. Breast, excision, Right inferior nipple - MULTIPLE SMALL FOCI OF INVASIVE DUCTAL CARCINOMA. - LYMPH/VASCULAR INVASION IS IDENTIFIED.- SUPERIOR MARGIN IS POSITIVE.- INFERIOR MARGIN IS FOCALLY POSITIVE. - TUMOR IS LESS THAN 0.1 CM TO POSTERIOR/LATERAL MARGIN BUT INKED MARGINAL SURFACE IS NEGATIVE.- OTHER MARGINS ARE NEGATIVE.- SEE COMMENT. 2. Breast, excision, Right superior nipple - MULTIPLE SMALL FOCI OF INVASIVE DUCTAL CARCINOMA.- LYMPH/VASCULAR INVASION IS IDENTIFIED.- INFERIOR MARGIN IS FOCALLY POSITIVE. - TUMOR IS LESS THAN 0.1 CM FROM POSTERIOR MARGIN BUT INKED MARGINAL SURFACE IS NEGATIVE.- OTHER MARGINS ARE NEGATIVE.- SEE COMMENT.   Skin status: Have you seen your surgeon for follow up? 02-27-16 Dr, [name] Have you seen your medical oncologist? Date If not ,when is appointment? Last appointment 12-19-15 next appointment with Dr. [name] 05-16-16 Arm movement: complete ROM Appetite: "Good" Pain: None Fatigue:None today

## 2016-03-05 ENCOUNTER — Encounter: Payer: Self-pay | Admitting: Radiation Oncology

## 2016-03-05 ENCOUNTER — Ambulatory Visit
Admission: RE | Admit: 2016-03-05 | Discharge: 2016-03-05 | Disposition: A | Discharge status: Home / Self Care | Payer: Medicare Other | Source: Ambulatory Visit | Attending: Radiation Oncology | Admitting: Radiation Oncology

## 2016-03-05 VITALS — BP 159/95 | HR 61 | Temp 97.6°F | Ht 63.0 in | Wt 167.1 lb

## 2016-03-05 DIAGNOSIS — C773 Secondary and unspecified malignant neoplasm of axilla and upper limb lymph nodes: Secondary | ICD-10-CM

## 2016-03-05 DIAGNOSIS — C50911 Malignant neoplasm of unspecified site of right female breast: Secondary | ICD-10-CM

## 2016-03-05 DIAGNOSIS — C50411 Malignant neoplasm of upper-outer quadrant of right female breast: Secondary | ICD-10-CM | POA: Diagnosis not present

## 2016-03-05 NOTE — Progress Notes (Signed)
Radiation Oncology         [phone] ________________________________  Name: Michelle Bowen MRN: [id number]  Date: 03/05/2016  DOB: December 14, 1940  CC:No primary care provider on file.  Excell Seltzer, MD     REFERRING PHYSICIAN: Excell Seltzer, MD  DIAGNOSIS: The primary encounter diagnosis was Breast cancer of upper-outer quadrant of right female breast ([place]). A diagnosis of Breast cancer metastasized to axillary lymph node, right ([facility]) was also pertinent to this visit.  HISTORY OF PRESENT ILLNESS: Michelle Bowen is a 75 y.o. female with a history of right breast cancer. The patient was first evaluated on 11/29/15 after a palpable lesion in the right breast was felt for a couple of month, she thought this was a recurrence of a sebaceous cyst that was removed in 2004. Bilateral diagnostic mammogram revealed a 3.3 cm area of lobulation within the right breast and an 8 mm mass in the lateral slightly upper quadrant of the right breast with 2 abnormal right axillary lymph nodes the largest being 3.4 cm. In the superior left breast in the anterior depth there is a 9 mm oval mass with indistinct margins and a small group of pleomorphic calcifications spanning 5 mm in the upper outer quadrant of the left breast. Stereotactic biopsy of the left breast calcifications were recommended. Ultrasound revealed a 4.4 x 3 x 2.9 cm right breast mass at 1:00 4 cm from the nipple and at 10:00 a 0.8 x 0.7 x 0.5 cm circumscribed mass consistent with benign cyst. Multiple abnormal lymph nodes in the axilla measuring up to 3 cm were present. In the left breast at 11:30 there was a 0.7 x 0.7 x 0.4 cm indistinct but circumscribed lesion with multiple borderline appearing lymph nodes measuring up to 4 mm in the left axilla. She underwent biopsy on 12/06/2015 revealing ER 100% positive PR negative at 0%, HER-2/neu positive and Ki-67 at 80% invasive ductal carcinoma of the right breast and noted in the right axillary lymph node  biopsy. The left breast was biopsied at 11:30 and revealed intraductal papilloma.    She has declined genetic testing. Stereotactic biopsy of the left breast calcifications revealed fibroadenoma without malignancy. On 01/17/16, she underwent left excisional biopsy of the breast revealing papillomatous change;  right lumpectomy with right axillary dissection. This revealed a 3.8 cm tumor consistent with invasive ductal carcinoma involving the anterior margin of the retroareolar region. Nodal disease was noted in 11/21 nodes. She underwent re-resection with Dr. Barry Dienes on 02/05/16 and repeat inferior and superior nipple excisions revealed focally positive margin of the inferior margin of the inferior specimen, and appeared negative in the superior margin. Dr. Barry Dienes does not feel there is any additional need for re-resection.  She has met with Dr. [name] and has since declined adjuvant chemotherapy, and comes to discuss the role of radiotherapy with Dr. Lisbeth Renshaw.  Gynecological History: [id number] Menarche at age 61 First live birth at age 73 Oral contraceptives for a short time   PREVIOUS RADIATION THERAPY: No  PAST MEDICAL HISTORY:  Past Medical History:  Diagnosis Date  . Arthritis    bil knees, left thumb  . Breast cancer ([place]) 12/06/15   Right  . Hypertension        PAST SURGICAL HISTORY: Past Surgical History:  Procedure Laterality Date  . BREAST LUMPECTOMY WITH AXILLARY LYMPH NODE DISSECTION Right 01/17/2016   Procedure: RIGHT BREAST LUMPECTOMY WITH AXILLARY LYMPH NODE DISSECTION;  Surgeon: [name], MD;  Location: [facility];  Service: General;  Laterality: Right;  . BREAST SURGERY Right 2001   sebaceous cyst removal  . RADIOACTIVE SEED GUIDED EXCISIONAL BREAST BIOPSY Left 01/17/2016   Procedure: RADIOACTIVE SEED GUIDED EXCISIONAL LEFT BREAST BIOPSY;  Surgeon: [name], MD;  Location: [facility];  Service: General;  Laterality: Left;  . RE-EXCISION OF  BREAST LUMPECTOMY Right 02/05/2016   Procedure: RE-EXCISION OF BREAST LUMPECTOMY AND REMOVAL OF NIPPLE;  Surgeon: [name], MD;  Location: [facility];  Service: General;  Laterality: Right;  . TONSILLECTOMY      FAMILY HISTORY:  Family History  Problem Relation Age of Onset  . Breast cancer Sister 30    breast  . Breast cancer      neice at 53  . Breast cancer      neice at 50    SOCIAL HISTORY:  reports that she has quit smoking. She has never used smokeless tobacco. She reports that she drinks alcohol. She reports that she does not use drugs. She is single and lives in [place].   ALLERGIES: Review of patient's allergies indicates no known allergies.   MEDICATIONS:  Current Outpatient Prescriptions  Medication Sig Dispense Refill  . cyanocobalamin 500 MCG tablet Take 500 mcg by mouth daily.    Marland Kitchen triamterene-hydrochlorothiazide (MAXZIDE-25) 37.5-25 MG tablet Take 1 tablet by mouth daily.    Javier Docker Oil 1000 MG CAPS Take by mouth.    . Lactobacillus (PROBIOTIC ACIDOPHILUS PO) Take by mouth.    . oxyCODONE (OXY IR/ROXICODONE) 5 MG immediate release tablet Take 1-2 tablets (5-10 mg total) by mouth every 6 (six) hours as needed for moderate pain, severe pain or breakthrough pain. (Patient not taking: Reported on 03/05/2016) 40 tablet 0   No current facility-administered medications for this encounter.      REVIEW OF SYSTEMS: On review of systems, the patient reports that she is doing well overall. She has been sore since surgery but denies any concerns with infectious findings or trouble with range of motion of her upper extremity on the right. She also denies any edema. She denies any chest pain, shortness of breath, cough, fevers, chills, night sweats, unintended weight changes. She denies any bowel or bladder disturbances, and denies abdominal pain, nausea or vomiting. She denies any new musculoskeletal or joint aches or pains. A complete review of systems is  obtained and is otherwise negative.    PHYSICAL EXAM:  height is '5\' 3"'$  (1.6 m) and weight is 167 lb 1.6 oz (75.8 kg). Her oral temperature is 97.6 F (36.4 C). Her blood pressure is 159/95 (abnormal) and her pulse is 61.  Pain scale 0/10 In general this is a well appearing African American female in no acute distress. She's alert and oriented x4 and appropriate throughout the examination. Cardiopulmonary assessment is negative for acute distress and she exhibits normal effort. Bilateral breasts are examined. The left breast incision is well healed as is the right breast incision. No cellulitic changes are noted of either site, her axillary site is assessed and is negative for cellulitic change and is also well healed. No lymphedema is noted.   ECOG = 0  0 - Asymptomatic (Fully active, able to carry on all predisease activities without restriction)  1 - Symptomatic but completely ambulatory (Restricted in physically strenuous activity but ambulatory and able to carry out work of a light or sedentary nature. For example, light housework, office work)  2 - Symptomatic, <50% in bed during the day (Ambulatory  and capable of all self care but unable to carry out any work activities. Up and about more than 50% of waking hours)  3 - Symptomatic, >50% in bed, but not bedbound (Capable of only limited self-care, confined to bed or chair 50% or more of waking hours)  4 - Bedbound (Completely disabled. Cannot carry on any self-care. Totally confined to bed or chair)  5 - Death   Eustace Pen MM, Creech RH, Tormey DC, et al. [id number]). "Toxicity and response criteria of the [facility]". [place] Oncol. 5 (6): 649-55   LABORATORY DATA:  Lab Results  Component Value Date   WBC 4.5 12/24/2015   HGB 12.6 12/24/2015   HCT 39.7 12/24/2015   MCV 83.0 12/24/2015   PLT 283 12/24/2015   Lab Results  Component Value Date   NA 138 02/01/2016   K 3.4 (L) 02/01/2016   CL 100 (L)  02/01/2016   CO2 29 02/01/2016   Lab Results  Component Value Date   ALT 31 12/24/2015   AST 33 12/24/2015   ALKPHOS 84 12/24/2015   BILITOT 0.73 12/24/2015      RADIOGRAPHY: No results found.    IMPRESSION/PLAN: 1. 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 Dr. Lisbeth Renshaw reviews the recommendations of care for treatment of stage III breast cancer which would include chemotherapy. The patient has been counseled on the risks of recurrence without systemic therapy. That being said, she's willing to take on this risk. She is however interested in radiation, and we discussed the rationale for radiotherapy in this setting to include treatment of the right breast, right supraclavicular region as well as the right axilla. Dr. Lisbeth Renshaw recommends a total of about 35 fractions over 7 weeks of radiotherapy. We discussed the possible side effects of radiation therapy including but not limited to skin redness, fatigue, permanent skin darkening, and breast swelling. We discussed the process of simulation and the placement of tattoos. She is interested in moving forward with radiation and will be scheduled for simulation this week, and start treatment next week. She will meet back with Dr. [name] to discuss estrogen blockade following treatment. 2. Hypertension. The patient's BP was elevated today and rechecked and again stable at the same level. She will be followed with repeat vitals during treatment, and understands to be seen by PCP for evaluation of this as she is currently asymptomatic.  The above documentation reflects my direct findings during this shared patient visit. Please see the separate note by Dr. Lisbeth Renshaw on this date for the remainder of the patient's plan of care.    Carola Rhine, PAC

## 2016-03-05 NOTE — Addendum Note (Signed)
Encounter addended by: Benn Moulder, RN on: 03/05/2016 10:53 AM<BR>    Actions taken: Charge Capture section accepted

## 2016-03-07 ENCOUNTER — Ambulatory Visit
Admission: RE | Admit: 2016-03-07 | Discharge: 2016-03-07 | Disposition: A | Discharge status: Home / Self Care | Payer: Medicare Other | Source: Ambulatory Visit | Attending: Radiation Oncology | Admitting: Radiation Oncology

## 2016-03-07 ENCOUNTER — Ambulatory Visit: Payer: Medicare Other | Admitting: Hematology and Oncology

## 2016-03-07 DIAGNOSIS — C50411 Malignant neoplasm of upper-outer quadrant of right female breast: Secondary | ICD-10-CM | POA: Diagnosis not present

## 2016-03-10 ENCOUNTER — Other Ambulatory Visit: Payer: Medicare Other

## 2016-03-10 ENCOUNTER — Ambulatory Visit: Payer: Medicare Other

## 2016-03-10 ENCOUNTER — Ambulatory Visit: Payer: Medicare Other | Admitting: Hematology and Oncology

## 2016-03-12 DIAGNOSIS — C50411 Malignant neoplasm of upper-outer quadrant of right female breast: Secondary | ICD-10-CM | POA: Diagnosis not present

## 2016-03-14 ENCOUNTER — Ambulatory Visit
Admission: RE | Admit: 2016-03-14 | Discharge: 2016-03-14 | Disposition: A | Discharge status: Home / Self Care | Payer: Medicare Other | Source: Ambulatory Visit | Attending: Radiation Oncology | Admitting: Radiation Oncology

## 2016-03-14 DIAGNOSIS — C50411 Malignant neoplasm of upper-outer quadrant of right female breast: Secondary | ICD-10-CM | POA: Diagnosis not present

## 2016-03-15 NOTE — Progress Notes (Signed)
  Radiation Oncology         [phone]) [phone] ________________________________  Name: Michelle Bowen MRN: [id number]  Date: 03/07/2016  DOB: [birth year]  DIAGNOSIS:     ICD-9-CM ICD-10-CM   1. Breast cancer of upper-outer quadrant of right female breast ([place]) 174.4 C50.411      SIMULATION AND TREATMENT PLANNING NOTE  The patient presented for simulation prior to beginning her course of radiation treatment for her diagnosis of right-sided breast cancer. The patient was placed in a supine position on a breast board. A customized vac-lock bag was also constructed and this complex treatment device will be used on a daily basis during her treatment. In this fashion, a CT scan was obtained through the chest area and an isocenter was placed near the chest wall at the upper aspect of the right chest.  The patient will be planned to receive a course of radiation initially to a dose of 50.4 gray. This will consist of a 4 field technique targeting the right breast as well as the supraclavicular region. Therefore 2 customized medial and lateral tangent fields have been created targeting the right breast, and also 2 additional customized fields have been designed to treat the supraclavicular region both with a right supraclavicular field and a right posterior axillary boost field. A forward planning/reduced field technique will also be evaluated to determine if this significantly improves the dose homogeneity of the overall plan. Therefore, additional customized blocks/fields may be necessary.  This initial treatment will be accomplished at 1.8 gray per fraction.   The initial plan will consist of a 3-D conformal technique. The target volume/scar, heart and lungs have been contoured and dose volume histograms of each of these structures will be evaluated as part of the 3-D conformal treatment planning process.   It is anticipated that the patient will then receive a 10 gray boost to the surgical scar. This will be  accomplished at 2 gray per fraction. The final anticipated total dose therefore will correspond to 60.4 gray.    _______________________________   Jodelle Gross, MD, PhD

## 2016-03-15 NOTE — Progress Notes (Signed)
  Radiation Oncology         [phone]) [phone] ________________________________  Name: Michelle Bowen MRN: [id number]  Date: 03/07/2016  DOB: 1940-10-03  Optical Surface Tracking Plan:  Since intensity modulated radiotherapy (IMRT) and 3D conformal radiation treatment methods are predicated on accurate and precise positioning for treatment, intrafraction motion monitoring is medically necessary to ensure accurate and safe treatment delivery.  The ability to quantify intrafraction motion without excessive ionizing radiation dose can only be performed with optical surface tracking. Accordingly, surface imaging offers the opportunity to obtain 3D measurements of patient position throughout IMRT and 3D treatments without excessive radiation exposure.  I am ordering optical surface tracking for this patient's upcoming course of radiotherapy. ________________________________  Kyung Rudd, MD 03/15/2016 11:14 AM    Reference:   Particia Jasper, et al. Surface imaging-based analysis of intrafraction motion for breast radiotherapy patients.Journal of [place], n. 6, nov. 2014. ISSN [id number].   Available at: <http://www.jacmp.org/index.php/jacmp/article/view/4957>.

## 2016-03-17 ENCOUNTER — Ambulatory Visit
Admission: RE | Admit: 2016-03-17 | Discharge: 2016-03-17 | Disposition: A | Discharge status: Home / Self Care | Payer: Medicare Other | Source: Ambulatory Visit | Attending: Radiation Oncology | Admitting: Radiation Oncology

## 2016-03-17 VITALS — Wt 164.4 lb

## 2016-03-17 DIAGNOSIS — Z79899 Other long term (current) drug therapy: Secondary | ICD-10-CM | POA: Insufficient documentation

## 2016-03-17 DIAGNOSIS — C50411 Malignant neoplasm of upper-outer quadrant of right female breast: Secondary | ICD-10-CM | POA: Diagnosis present

## 2016-03-17 DIAGNOSIS — I10 Essential (primary) hypertension: Secondary | ICD-10-CM | POA: Diagnosis not present

## 2016-03-17 DIAGNOSIS — Z170 Estrogen receptor positive status [ER+]: Secondary | ICD-10-CM | POA: Insufficient documentation

## 2016-03-17 DIAGNOSIS — Z87891 Personal history of nicotine dependence: Secondary | ICD-10-CM | POA: Diagnosis not present

## 2016-03-17 MED ORDER — RADIAPLEXRX EX GEL
Freq: Once | CUTANEOUS | Status: AC
  Administered 2016-03-17: 11:00:00 via TOPICAL

## 2016-03-17 MED ORDER — ALRA NON-METALLIC DEODORANT (RAD-ONC)
1.0000 "application " | Freq: Once | TOPICAL | Status: AC
  Administered 2016-03-17: 1 via TOPICAL

## 2016-03-17 NOTE — Progress Notes (Signed)
Patient education done, radiation therapy and you book, my business card, alra deodorant and radiaplex gel cream given to patient, discussed ways to manage side effects, skin irritation,pain, fatigue, swelling /soreness breast, use of electric razor only for under arm, increase protein on diet, drink plenty fluids,water, dove sopa unscented recommended, verbal understanding, sees MD/staff Rn weekly and prn, teach back given 10:32 AM

## 2016-03-18 ENCOUNTER — Ambulatory Visit
Admission: RE | Admit: 2016-03-18 | Discharge: 2016-03-18 | Disposition: A | Discharge status: Home / Self Care | Payer: Medicare Other | Source: Ambulatory Visit | Attending: Radiation Oncology | Admitting: Radiation Oncology

## 2016-03-18 DIAGNOSIS — C50411 Malignant neoplasm of upper-outer quadrant of right female breast: Secondary | ICD-10-CM | POA: Diagnosis not present

## 2016-03-19 ENCOUNTER — Ambulatory Visit
Admission: RE | Admit: 2016-03-19 | Discharge: 2016-03-19 | Disposition: A | Discharge status: Home / Self Care | Payer: Medicare Other | Source: Ambulatory Visit | Attending: Radiation Oncology | Admitting: Radiation Oncology

## 2016-03-19 DIAGNOSIS — C50411 Malignant neoplasm of upper-outer quadrant of right female breast: Secondary | ICD-10-CM | POA: Diagnosis not present

## 2016-03-20 ENCOUNTER — Ambulatory Visit
Admission: RE | Admit: 2016-03-20 | Discharge: 2016-03-20 | Disposition: A | Discharge status: Home / Self Care | Payer: Medicare Other | Source: Ambulatory Visit | Attending: Radiation Oncology | Admitting: Radiation Oncology

## 2016-03-20 DIAGNOSIS — C50411 Malignant neoplasm of upper-outer quadrant of right female breast: Secondary | ICD-10-CM | POA: Diagnosis not present

## 2016-03-21 ENCOUNTER — Encounter: Payer: Self-pay | Admitting: Radiation Oncology

## 2016-03-21 ENCOUNTER — Ambulatory Visit
Admission: RE | Admit: 2016-03-21 | Discharge: 2016-03-21 | Disposition: A | Discharge status: Home / Self Care | Payer: Medicare Other | Source: Ambulatory Visit | Attending: Radiation Oncology | Admitting: Radiation Oncology

## 2016-03-21 VITALS — BP 123/70 | HR 67 | Temp 97.8°F | Resp 20 | Wt 164.0 lb

## 2016-03-21 DIAGNOSIS — C50411 Malignant neoplasm of upper-outer quadrant of right female breast: Secondary | ICD-10-CM | POA: Diagnosis not present

## 2016-03-21 NOTE — Progress Notes (Addendum)
Weekly rad txs 5/28 so far completed right breast, no skin changes, using radiaplex bid, no pain, appetite good, no fatigue 8:52 AM BP 123/70 (BP Location: Left Arm, Patient Position: Sitting, Cuff Size: Normal)   Pulse 67   Temp 97.8 F (36.6 C) (Axillary)   Resp 20   Wt 164 lb (74.4 kg)   LMP 12/26/2015   BMI 29.05 kg/m   Wt Readings from Last 3 Encounters:  03/21/16 164 lb (74.4 kg)  03/17/16 164 lb 6.4 oz (74.6 kg)  03/05/16 167 lb 1.6 oz (75.8 kg)

## 2016-03-21 NOTE — Progress Notes (Signed)
   Department of Radiation Oncology  Phone:  [phone] Fax:        [phone]  Weekly Treatment Note    Name: Michelle Bowen Date: 03/21/2016 MRN: [id number] DOB: January 17, 1941   Diagnosis:     ICD-9-CM ICD-10-CM   1. Breast cancer of upper-outer quadrant of right female breast ([place]) 174.4 C50.411      Current dose: 9 Gy  Current fraction:5   MEDICATIONS: Current Outpatient Prescriptions  Medication Sig Dispense Refill  . cyanocobalamin 500 MCG tablet Take 500 mcg by mouth daily.    . hyaluronate sodium (RADIAPLEXRX) GEL Apply 1 application topically 2 (two) times daily. Apply to affected skin after rad tx and bedtime daily    . Krill Oil 1000 MG CAPS Take by mouth.    . Lactobacillus (PROBIOTIC ACIDOPHILUS PO) Take by mouth.    . non-metallic deodorant Jethro Poling) MISC Apply 1 application topically daily as needed.    . triamterene-hydrochlorothiazide (MAXZIDE-25) 37.5-25 MG tablet Take 1 tablet by mouth daily.    Marland Kitchen oxyCODONE (OXY IR/ROXICODONE) 5 MG immediate release tablet Take 1-2 tablets (5-10 mg total) by mouth every 6 (six) hours as needed for moderate pain, severe pain or breakthrough pain. (Patient not taking: Reported on 03/21/2016) 40 tablet 0   No current facility-administered medications for this encounter.      ALLERGIES: Review of patient's allergies indicates no known allergies.   LABORATORY DATA:  Lab Results  Component Value Date   WBC 4.5 12/24/2015   HGB 12.6 12/24/2015   HCT 39.7 12/24/2015   MCV 83.0 12/24/2015   PLT 283 12/24/2015   Lab Results  Component Value Date   NA 138 02/01/2016   K 3.4 (L) 02/01/2016   CL 100 (L) 02/01/2016   CO2 29 02/01/2016   Lab Results  Component Value Date   ALT 31 12/24/2015   AST 33 12/24/2015   ALKPHOS 84 12/24/2015   BILITOT 0.73 12/24/2015     NARRATIVE: Michelle Bowen was seen today for weekly treatment management. The chart was checked and the patient's films were reviewed.  Weekly rad txs 5/28 so  far completed right breast, no skin changes, using radiaplex bid, no pain, appetite good, no fatigue 9:07 AM BP 123/70 (BP Location: Left Arm, Patient Position: Sitting, Cuff Size: Normal)   Pulse 67   Temp 97.8 F (36.6 C) (Axillary)   Resp 20   Wt 164 lb (74.4 kg)   LMP 12/26/2015   BMI 29.05 kg/m   Wt Readings from Last 3 Encounters:  03/21/16 164 lb (74.4 kg)  03/17/16 164 lb 6.4 oz (74.6 kg)  03/05/16 167 lb 1.6 oz (75.8 kg)    PHYSICAL EXAMINATION: weight is 164 lb (74.4 kg). Her axillary temperature is 97.8 F (36.6 C). Her blood pressure is 123/70 and her pulse is 67. Her respiration is 20.        ASSESSMENT: The patient is doing satisfactorily with treatment.  PLAN: We will continue with the patient's radiation treatment as planned.

## 2016-03-24 ENCOUNTER — Ambulatory Visit
Admission: RE | Admit: 2016-03-24 | Discharge: 2016-03-24 | Disposition: A | Discharge status: Home / Self Care | Payer: Medicare Other | Source: Ambulatory Visit | Attending: Radiation Oncology | Admitting: Radiation Oncology

## 2016-03-24 DIAGNOSIS — Z87891 Personal history of nicotine dependence: Secondary | ICD-10-CM | POA: Diagnosis not present

## 2016-03-24 DIAGNOSIS — Z170 Estrogen receptor positive status [ER+]: Secondary | ICD-10-CM | POA: Diagnosis not present

## 2016-03-24 DIAGNOSIS — I10 Essential (primary) hypertension: Secondary | ICD-10-CM | POA: Diagnosis not present

## 2016-03-24 DIAGNOSIS — Z79899 Other long term (current) drug therapy: Secondary | ICD-10-CM | POA: Diagnosis not present

## 2016-03-24 DIAGNOSIS — C50411 Malignant neoplasm of upper-outer quadrant of right female breast: Secondary | ICD-10-CM | POA: Diagnosis present

## 2016-03-25 ENCOUNTER — Ambulatory Visit
Admission: RE | Admit: 2016-03-25 | Discharge: 2016-03-25 | Disposition: A | Discharge status: Home / Self Care | Payer: Medicare Other | Source: Ambulatory Visit | Attending: Radiation Oncology | Admitting: Radiation Oncology

## 2016-03-25 DIAGNOSIS — C50411 Malignant neoplasm of upper-outer quadrant of right female breast: Secondary | ICD-10-CM | POA: Diagnosis not present

## 2016-03-26 ENCOUNTER — Ambulatory Visit
Admission: RE | Admit: 2016-03-26 | Discharge: 2016-03-26 | Disposition: A | Discharge status: Home / Self Care | Payer: Medicare Other | Source: Ambulatory Visit | Attending: Radiation Oncology | Admitting: Radiation Oncology

## 2016-03-26 DIAGNOSIS — C50411 Malignant neoplasm of upper-outer quadrant of right female breast: Secondary | ICD-10-CM | POA: Diagnosis not present

## 2016-03-27 ENCOUNTER — Ambulatory Visit
Admission: RE | Admit: 2016-03-27 | Discharge: 2016-03-27 | Disposition: A | Discharge status: Home / Self Care | Payer: Medicare Other | Source: Ambulatory Visit | Attending: Radiation Oncology | Admitting: Radiation Oncology

## 2016-03-27 ENCOUNTER — Encounter: Payer: Self-pay | Admitting: Radiation Oncology

## 2016-03-27 VITALS — BP 144/69 | HR 56 | Temp 98.1°F | Resp 18 | Ht 63.0 in | Wt 164.4 lb

## 2016-03-27 DIAGNOSIS — Z170 Estrogen receptor positive status [ER+]: Principal | ICD-10-CM

## 2016-03-27 DIAGNOSIS — C50411 Malignant neoplasm of upper-outer quadrant of right female breast: Secondary | ICD-10-CM | POA: Diagnosis not present

## 2016-03-27 NOTE — Progress Notes (Signed)
Weekly rad txs 9/28 so far completed right breast, no skin changes, using radiaplex bid, no pain, appetite good, starting to have mild fatigue in the afternoon. Wt Readings from Last 3 Encounters:  03/27/16 164 lb 6.4 oz (74.6 kg)  03/21/16 164 lb (74.4 kg)  03/17/16 164 lb 6.4 oz (74.6 kg)  BP (!) 144/69 (BP Location: Left Arm, Patient Position: Sitting, Cuff Size: Normal)   Pulse (!) 56   Temp 98.1 F (36.7 C) (Oral)   Resp 18   Ht 5\' 3"  (1.6 m)   Wt 164 lb 6.4 oz (74.6 kg)   LMP 12/26/2015   SpO2 100%   BMI 29.12 kg/m

## 2016-03-27 NOTE — Progress Notes (Signed)
   Department of Radiation Oncology  Phone:  [phone] Fax:        [phone]  Weekly Treatment Note    Name: Michelle Bowen Date: 03/27/2016 MRN: [id number] DOB: 08-02-1940   Diagnosis:     ICD-9-CM ICD-10-CM   1. Malignant neoplasm of upper-outer quadrant of right breast in female, estrogen receptor positive ([place]) 174.4 C50.411    V86.0 Z17.0      Current dose: 16.2 Gy  Current fraction: 9   MEDICATIONS: Current Outpatient Prescriptions  Medication Sig Dispense Refill  . cyanocobalamin 500 MCG tablet Take 500 mcg by mouth daily.    . hyaluronate sodium (RADIAPLEXRX) GEL Apply 1 application topically 2 (two) times daily. Apply to affected skin after rad tx and bedtime daily    . Krill Oil 1000 MG CAPS Take by mouth.    . Lactobacillus (PROBIOTIC ACIDOPHILUS PO) Take by mouth.    . non-metallic deodorant Jethro Poling) MISC Apply 1 application topically daily as needed.    Marland Kitchen oxyCODONE (OXY IR/ROXICODONE) 5 MG immediate release tablet Take 1-2 tablets (5-10 mg total) by mouth every 6 (six) hours as needed for moderate pain, severe pain or breakthrough pain. 40 tablet 0  . triamterene-hydrochlorothiazide (MAXZIDE-25) 37.5-25 MG tablet Take 1 tablet by mouth daily.     No current facility-administered medications for this encounter.      ALLERGIES: Review of patient's allergies indicates no known allergies.   LABORATORY DATA:  Lab Results  Component Value Date   WBC 4.5 12/24/2015   HGB 12.6 12/24/2015   HCT 39.7 12/24/2015   MCV 83.0 12/24/2015   PLT 283 12/24/2015   Lab Results  Component Value Date   NA 138 02/01/2016   K 3.4 (L) 02/01/2016   CL 100 (L) 02/01/2016   CO2 29 02/01/2016   Lab Results  Component Value Date   ALT 31 12/24/2015   AST 33 12/24/2015   ALKPHOS 84 12/24/2015   BILITOT 0.73 12/24/2015     NARRATIVE: Michelle Bowen was seen today for weekly treatment management. The chart was checked and the patient's films were reviewed.  Weekly rad  txs 9/28 so far completed right breast, no skin changes, using radiaplex bid, no pain, appetite good, starting to have mild fatigue in the afternoon. Wt Readings from Last 3 Encounters:  03/27/16 164 lb 6.4 oz (74.6 kg)  03/21/16 164 lb (74.4 kg)  03/17/16 164 lb 6.4 oz (74.6 kg)  BP (!) 144/69 (BP Location: Left Arm, Patient Position: Sitting, Cuff Size: Normal)   Pulse (!) 56   Temp 98.1 F (36.7 C) (Oral)   Resp 18   Ht 5\' 3"  (1.6 m)   Wt 164 lb 6.4 oz (74.6 kg)   LMP 12/26/2015   SpO2 100%   BMI 29.12 kg/m   PHYSICAL EXAMINATION: height is 5\' 3"  (1.6 m) and weight is 164 lb 6.4 oz (74.6 kg). Her oral temperature is 98.1 F (36.7 C). Her blood pressure is 144/69 (abnormal) and her pulse is 56 (abnormal). Her respiration is 18 and oxygen saturation is 100%.        ASSESSMENT: The patient is doing satisfactorily with treatment.  PLAN: We will continue with the patient's radiation treatment as planned.

## 2016-03-28 ENCOUNTER — Ambulatory Visit
Admission: RE | Admit: 2016-03-28 | Discharge: 2016-03-28 | Disposition: A | Discharge status: Home / Self Care | Payer: Medicare Other | Source: Ambulatory Visit | Attending: Radiation Oncology | Admitting: Radiation Oncology

## 2016-03-28 DIAGNOSIS — C50411 Malignant neoplasm of upper-outer quadrant of right female breast: Secondary | ICD-10-CM | POA: Diagnosis not present

## 2016-03-31 ENCOUNTER — Ambulatory Visit
Admission: RE | Admit: 2016-03-31 | Discharge: 2016-03-31 | Disposition: A | Discharge status: Home / Self Care | Payer: Medicare Other | Source: Ambulatory Visit | Attending: Radiation Oncology | Admitting: Radiation Oncology

## 2016-03-31 DIAGNOSIS — C50411 Malignant neoplasm of upper-outer quadrant of right female breast: Secondary | ICD-10-CM | POA: Diagnosis not present

## 2016-04-01 ENCOUNTER — Ambulatory Visit
Admission: RE | Admit: 2016-04-01 | Discharge: 2016-04-01 | Disposition: A | Discharge status: Home / Self Care | Payer: Medicare Other | Source: Ambulatory Visit | Attending: Radiation Oncology | Admitting: Radiation Oncology

## 2016-04-01 DIAGNOSIS — C50411 Malignant neoplasm of upper-outer quadrant of right female breast: Secondary | ICD-10-CM | POA: Diagnosis not present

## 2016-04-02 ENCOUNTER — Ambulatory Visit
Admission: RE | Admit: 2016-04-02 | Discharge: 2016-04-02 | Disposition: A | Discharge status: Home / Self Care | Payer: Medicare Other | Source: Ambulatory Visit | Attending: Radiation Oncology | Admitting: Radiation Oncology

## 2016-04-02 DIAGNOSIS — C50411 Malignant neoplasm of upper-outer quadrant of right female breast: Secondary | ICD-10-CM | POA: Diagnosis not present

## 2016-04-03 ENCOUNTER — Ambulatory Visit
Admission: RE | Admit: 2016-04-03 | Discharge: 2016-04-03 | Disposition: A | Discharge status: Home / Self Care | Payer: Medicare Other | Source: Ambulatory Visit | Attending: Radiation Oncology | Admitting: Radiation Oncology

## 2016-04-03 ENCOUNTER — Encounter: Payer: Self-pay | Admitting: Radiation Oncology

## 2016-04-03 VITALS — BP 122/79 | HR 56 | Temp 98.3°F | Resp 20 | Wt 163.4 lb

## 2016-04-03 DIAGNOSIS — Z170 Estrogen receptor positive status [ER+]: Principal | ICD-10-CM

## 2016-04-03 DIAGNOSIS — C50411 Malignant neoplasm of upper-outer quadrant of right female breast: Secondary | ICD-10-CM

## 2016-04-03 NOTE — Progress Notes (Signed)
Weekly rad txs right breast,mild tanning, skin intact,using radiaplex bid, occasional twinges, slight swelling, no pain,  Appetite good,  Gets tired in the afternoons, takes rest periods,  9:34 AM BP 122/79 (BP Location: Left Arm, Patient Position: Sitting, Cuff Size: Normal)   Pulse (!) 56   Temp 98.3 F (36.8 C) (Oral)   Resp 20   Wt 163 lb 6.4 oz (74.1 kg)   LMP 12/26/2015   BMI 28.95 kg/m   Wt Readings from Last 3 Encounters:  04/03/16 163 lb 6.4 oz (74.1 kg)  03/27/16 164 lb 6.4 oz (74.6 kg)  03/21/16 164 lb (74.4 kg)

## 2016-04-03 NOTE — Progress Notes (Signed)
   Department of Radiation Oncology  Phone:  [phone] Fax:        [phone]  Weekly Treatment Note    Name: Michelle Bowen Date: 04/03/2016 MRN: [id number] DOB: 10/14/1940   Diagnosis:     ICD-9-CM ICD-10-CM   1. Malignant neoplasm of upper-outer quadrant of right breast in female, estrogen receptor positive ([place]) 174.4 C50.411    V86.0 Z17.0      Current dose: 25.2 Gy  Current fraction: 14   MEDICATIONS: Current Outpatient Prescriptions  Medication Sig Dispense Refill  . cyanocobalamin 500 MCG tablet Take 500 mcg by mouth daily.    . hyaluronate sodium (RADIAPLEXRX) GEL Apply 1 application topically 2 (two) times daily. Apply to affected skin after rad tx and bedtime daily    . Krill Oil 1000 MG CAPS Take by mouth.    . Lactobacillus (PROBIOTIC ACIDOPHILUS PO) Take by mouth.    . non-metallic deodorant Jethro Poling) MISC Apply 1 application topically daily as needed.    Marland Kitchen oxyCODONE (OXY IR/ROXICODONE) 5 MG immediate release tablet Take 1-2 tablets (5-10 mg total) by mouth every 6 (six) hours as needed for moderate pain, severe pain or breakthrough pain. 40 tablet 0  . triamterene-hydrochlorothiazide (MAXZIDE-25) 37.5-25 MG tablet Take 1 tablet by mouth daily.     No current facility-administered medications for this encounter.      ALLERGIES: Review of patient's allergies indicates no known allergies.   LABORATORY DATA:  Lab Results  Component Value Date   WBC 4.5 12/24/2015   HGB 12.6 12/24/2015   HCT 39.7 12/24/2015   MCV 83.0 12/24/2015   PLT 283 12/24/2015   Lab Results  Component Value Date   NA 138 02/01/2016   K 3.4 (L) 02/01/2016   CL 100 (L) 02/01/2016   CO2 29 02/01/2016   Lab Results  Component Value Date   ALT 31 12/24/2015   AST 33 12/24/2015   ALKPHOS 84 12/24/2015   BILITOT 0.73 12/24/2015     NARRATIVE: Michelle Bowen was seen today for weekly treatment management. The chart was checked and the patient's films were reviewed.  Weekly  rad txs right breast,mild tanning, skin intact,using radiaplex bid, occasional twinges, slight swelling, no pain,  Appetite good,  Gets tired in the afternoons, takes rest periods,  9:57 AM BP 122/79 (BP Location: Left Arm, Patient Position: Sitting, Cuff Size: Normal)   Pulse (!) 56   Temp 98.3 F (36.8 C) (Oral)   Resp 20   Wt 163 lb 6.4 oz (74.1 kg)   LMP 12/26/2015   BMI 28.95 kg/m   Wt Readings from Last 3 Encounters:  04/03/16 163 lb 6.4 oz (74.1 kg)  03/27/16 164 lb 6.4 oz (74.6 kg)  03/21/16 164 lb (74.4 kg)    PHYSICAL EXAMINATION: weight is 163 lb 6.4 oz (74.1 kg). Her oral temperature is 98.3 F (36.8 C). Her blood pressure is 122/79 and her pulse is 56 (abnormal). Her respiration is 20.  mild hyperpigmentation present in the treatment area. No desquamation is present.      ASSESSMENT: The patient is doing satisfactorily with treatment.  PLAN: We will continue with the patient's radiation treatment as planned.

## 2016-04-04 ENCOUNTER — Ambulatory Visit
Admission: RE | Admit: 2016-04-04 | Discharge: 2016-04-04 | Disposition: A | Discharge status: Home / Self Care | Payer: Medicare Other | Source: Ambulatory Visit | Attending: Radiation Oncology | Admitting: Radiation Oncology

## 2016-04-04 DIAGNOSIS — C50411 Malignant neoplasm of upper-outer quadrant of right female breast: Secondary | ICD-10-CM | POA: Diagnosis not present

## 2016-04-07 ENCOUNTER — Ambulatory Visit
Admission: RE | Admit: 2016-04-07 | Discharge: 2016-04-07 | Disposition: A | Discharge status: Home / Self Care | Payer: Medicare Other | Source: Ambulatory Visit | Attending: Radiation Oncology | Admitting: Radiation Oncology

## 2016-04-07 DIAGNOSIS — C50411 Malignant neoplasm of upper-outer quadrant of right female breast: Secondary | ICD-10-CM | POA: Diagnosis not present

## 2016-04-08 ENCOUNTER — Ambulatory Visit
Admission: RE | Admit: 2016-04-08 | Discharge: 2016-04-08 | Disposition: A | Discharge status: Home / Self Care | Payer: Medicare Other | Source: Ambulatory Visit | Attending: Radiation Oncology | Admitting: Radiation Oncology

## 2016-04-08 DIAGNOSIS — C50411 Malignant neoplasm of upper-outer quadrant of right female breast: Secondary | ICD-10-CM | POA: Diagnosis not present

## 2016-04-09 ENCOUNTER — Ambulatory Visit
Admission: RE | Admit: 2016-04-09 | Discharge: 2016-04-09 | Disposition: A | Discharge status: Home / Self Care | Payer: Medicare Other | Source: Ambulatory Visit | Attending: Radiation Oncology | Admitting: Radiation Oncology

## 2016-04-09 ENCOUNTER — Encounter: Payer: Self-pay | Admitting: Radiation Oncology

## 2016-04-09 VITALS — BP 161/73 | HR 57 | Temp 97.6°F | Resp 16 | Wt 161.8 lb

## 2016-04-09 DIAGNOSIS — C50411 Malignant neoplasm of upper-outer quadrant of right female breast: Secondary | ICD-10-CM | POA: Diagnosis not present

## 2016-04-09 DIAGNOSIS — Z170 Estrogen receptor positive status [ER+]: Principal | ICD-10-CM

## 2016-04-09 NOTE — Progress Notes (Signed)
Department of Radiation Oncology  Phone:  [phone] Fax:        [phone]  Weekly Treatment Note    Name: Michelle Bowen Date: 04/09/2016 MRN: [id number] DOB: 07-02-1940   Diagnosis:     ICD-9-CM ICD-10-CM   1. Malignant neoplasm of upper-outer quadrant of right breast in female, estrogen receptor positive ([place]) 174.4 C50.411    V86.0 Z17.0      Current dose: 32.4 Gy  Current fraction: 18   MEDICATIONS: Current Outpatient Prescriptions  Medication Sig Dispense Refill  . cyanocobalamin 500 MCG tablet Take 500 mcg by mouth daily.    . hyaluronate sodium (RADIAPLEXRX) GEL Apply 1 application topically 2 (two) times daily. Apply to affected skin after rad tx and bedtime daily    . Krill Oil 1000 MG CAPS Take by mouth.    . Lactobacillus (PROBIOTIC ACIDOPHILUS PO) Take by mouth.    . non-metallic deodorant Jethro Poling) MISC Apply 1 application topically daily as needed.    Marland Kitchen oxyCODONE (OXY IR/ROXICODONE) 5 MG immediate release tablet Take 1-2 tablets (5-10 mg total) by mouth every 6 (six) hours as needed for moderate pain, severe pain or breakthrough pain. 40 tablet 0  . triamterene-hydrochlorothiazide (MAXZIDE-25) 37.5-25 MG tablet Take 1 tablet by mouth daily.     No current facility-administered medications for this encounter.      ALLERGIES: Review of patient's allergies indicates no known allergies.   LABORATORY DATA:  Lab Results  Component Value Date   WBC 4.5 12/24/2015   HGB 12.6 12/24/2015   HCT 39.7 12/24/2015   MCV 83.0 12/24/2015   PLT 283 12/24/2015   Lab Results  Component Value Date   NA 138 02/01/2016   K 3.4 (L) 02/01/2016   CL 100 (L) 02/01/2016   CO2 29 02/01/2016   Lab Results  Component Value Date   ALT 31 12/24/2015   AST 33 12/24/2015   ALKPHOS 84 12/24/2015   BILITOT 0.73 12/24/2015     NARRATIVE: Michelle Bowen was seen today for weekly treatment management. The chart was checked and the patient's films were reviewed.  Weekly  rad txs right breast 18/36 completed. Slight tanning, no skin breakdaown, slight discomfort under axilla, and using radiaplex bid. Good appetite and mild fatigue.  Wt Readings from Last 3 Encounters:  04/09/16 161 lb 12.8 oz (73.4 kg)  04/03/16 163 lb 6.4 oz (74.1 kg)  03/27/16 164 lb 6.4 oz (74.6 kg)  BP (!) 161/73 (BP Location: Left Arm, Patient Position: Sitting, Cuff Size: Normal)   Pulse (!) 57   Temp 97.6 F (36.4 C) (Oral)   Resp 16   Wt 161 lb 12.8 oz (73.4 kg)   LMP 12/26/2015   BMI 28.66 kg/m   PHYSICAL EXAMINATION: weight is 161 lb 12.8 oz (73.4 kg). Her oral temperature is 97.6 F (36.4 C). Her blood pressure is 161/73 (abnormal) and her pulse is 57 (abnormal). Her respiration is 16.  Mild hyperpigmentation in the treatment area without desquamation.  ASSESSMENT: The patient is doing satisfactorily with treatment.  PLAN: We will continue with the patient's radiation treatment as planned.   ------------------------------------------------  Jodelle Gross, MD, PhD  This document serves as a record of services personally performed by Kyung Rudd, MD. It was created on his behalf by Darcus Austin, a trained medical scribe. The creation of this record is based on the scribe's personal observations and the provider's statements to them. This document has been checked and approved by the attending  provider.

## 2016-04-09 NOTE — Progress Notes (Addendum)
weekly rad txs right breast 18/36 completed, slight tanning, no skin breakdaown, slight discomfort under axilla, appetite good, using radiaplex bid, mild tiredness 9:41 AM Wt Readings from Last 3 Encounters:  04/09/16 161 lb 12.8 oz (73.4 kg)  04/03/16 163 lb 6.4 oz (74.1 kg)  03/27/16 164 lb 6.4 oz (74.6 kg)  BP (!) 161/73 (BP Location: Left Arm, Patient Position: Sitting, Cuff Size: Normal)   Pulse (!) 57   Temp 97.6 F (36.4 C) (Oral)   Resp 16   Wt 161 lb 12.8 oz (73.4 kg)   LMP 12/26/2015   BMI 28.66 kg/m

## 2016-04-10 ENCOUNTER — Ambulatory Visit
Admission: RE | Admit: 2016-04-10 | Discharge: 2016-04-10 | Disposition: A | Discharge status: Home / Self Care | Payer: Medicare Other | Source: Ambulatory Visit | Attending: Radiation Oncology | Admitting: Radiation Oncology

## 2016-04-10 DIAGNOSIS — C50411 Malignant neoplasm of upper-outer quadrant of right female breast: Secondary | ICD-10-CM | POA: Diagnosis not present

## 2016-04-11 ENCOUNTER — Ambulatory Visit
Admission: RE | Admit: 2016-04-11 | Discharge: 2016-04-11 | Disposition: A | Discharge status: Home / Self Care | Payer: Medicare Other | Source: Ambulatory Visit | Attending: Radiation Oncology | Admitting: Radiation Oncology

## 2016-04-11 ENCOUNTER — Encounter: Payer: Self-pay | Admitting: Radiation Oncology

## 2016-04-11 DIAGNOSIS — C50411 Malignant neoplasm of upper-outer quadrant of right female breast: Secondary | ICD-10-CM | POA: Diagnosis not present

## 2016-04-14 ENCOUNTER — Ambulatory Visit
Admission: RE | Admit: 2016-04-14 | Discharge: 2016-04-14 | Disposition: A | Discharge status: Home / Self Care | Payer: Medicare Other | Source: Ambulatory Visit | Attending: Radiation Oncology | Admitting: Radiation Oncology

## 2016-04-14 ENCOUNTER — Ambulatory Visit: Payer: Medicare Other | Admitting: Radiation Oncology

## 2016-04-14 DIAGNOSIS — C50411 Malignant neoplasm of upper-outer quadrant of right female breast: Secondary | ICD-10-CM | POA: Diagnosis not present

## 2016-04-15 ENCOUNTER — Ambulatory Visit
Admission: RE | Admit: 2016-04-15 | Discharge: 2016-04-15 | Disposition: A | Discharge status: Home / Self Care | Payer: Medicare Other | Source: Ambulatory Visit | Attending: Radiation Oncology | Admitting: Radiation Oncology

## 2016-04-15 DIAGNOSIS — C50411 Malignant neoplasm of upper-outer quadrant of right female breast: Secondary | ICD-10-CM | POA: Diagnosis not present

## 2016-04-16 ENCOUNTER — Encounter: Payer: Self-pay | Admitting: Radiation Oncology

## 2016-04-16 ENCOUNTER — Ambulatory Visit
Admission: RE | Admit: 2016-04-16 | Discharge: 2016-04-16 | Disposition: A | Discharge status: Home / Self Care | Payer: Medicare Other | Source: Ambulatory Visit | Attending: Radiation Oncology | Admitting: Radiation Oncology

## 2016-04-16 DIAGNOSIS — C50411 Malignant neoplasm of upper-outer quadrant of right female breast: Secondary | ICD-10-CM | POA: Diagnosis not present

## 2016-04-17 ENCOUNTER — Ambulatory Visit
Admission: RE | Admit: 2016-04-17 | Discharge: 2016-04-17 | Disposition: A | Discharge status: Home / Self Care | Payer: Medicare Other | Source: Ambulatory Visit | Attending: Radiation Oncology | Admitting: Radiation Oncology

## 2016-04-17 DIAGNOSIS — C50411 Malignant neoplasm of upper-outer quadrant of right female breast: Secondary | ICD-10-CM | POA: Diagnosis not present

## 2016-04-18 ENCOUNTER — Ambulatory Visit
Admission: RE | Admit: 2016-04-18 | Discharge: 2016-04-18 | Disposition: A | Discharge status: Home / Self Care | Payer: Medicare Other | Source: Ambulatory Visit | Attending: Radiation Oncology | Admitting: Radiation Oncology

## 2016-04-18 ENCOUNTER — Encounter: Payer: Self-pay | Admitting: Radiation Oncology

## 2016-04-18 ENCOUNTER — Ambulatory Visit: Payer: Medicare Other | Admitting: Radiation Oncology

## 2016-04-18 VITALS — BP 110/60 | HR 65 | Temp 97.7°F | Resp 16 | Wt 160.8 lb

## 2016-04-18 DIAGNOSIS — C50411 Malignant neoplasm of upper-outer quadrant of right female breast: Secondary | ICD-10-CM | POA: Diagnosis not present

## 2016-04-18 DIAGNOSIS — Z170 Estrogen receptor positive status [ER+]: Principal | ICD-10-CM

## 2016-04-18 NOTE — Progress Notes (Signed)
Department of Radiation Oncology  Phone:  [phone] Fax:        [phone]  Weekly Treatment Note    Name: Michelle Bowen Date: 04/18/2016 MRN: [id number] DOB: 1940/09/14   Diagnosis:     ICD-9-CM ICD-10-CM   1. Malignant neoplasm of upper-outer quadrant of right breast in female, estrogen receptor positive ([place]) 174.4 C50.411    V86.0 Z17.0      Current dose: 45 Gy  Current fraction: 25   MEDICATIONS: Current Outpatient Prescriptions  Medication Sig Dispense Refill  . cyanocobalamin 500 MCG tablet Take 500 mcg by mouth daily.    . hyaluronate sodium (RADIAPLEXRX) GEL Apply 1 application topically 2 (two) times daily. Apply to affected skin after rad tx and bedtime daily    . Krill Oil 1000 MG CAPS Take by mouth.    . Lactobacillus (PROBIOTIC ACIDOPHILUS PO) Take by mouth.    . non-metallic deodorant Jethro Poling) MISC Apply 1 application topically daily as needed.    Marland Kitchen oxyCODONE (OXY IR/ROXICODONE) 5 MG immediate release tablet Take 1-2 tablets (5-10 mg total) by mouth every 6 (six) hours as needed for moderate pain, severe pain or breakthrough pain. 40 tablet 0  . triamterene-hydrochlorothiazide (MAXZIDE-25) 37.5-25 MG tablet Take 1 tablet by mouth daily.     No current facility-administered medications for this encounter.      ALLERGIES: Review of patient's allergies indicates no known allergies.   LABORATORY DATA:  Lab Results  Component Value Date   WBC 4.5 12/24/2015   HGB 12.6 12/24/2015   HCT 39.7 12/24/2015   MCV 83.0 12/24/2015   PLT 283 12/24/2015   Lab Results  Component Value Date   NA 138 02/01/2016   K 3.4 (L) 02/01/2016   CL 100 (L) 02/01/2016   CO2 29 02/01/2016   Lab Results  Component Value Date   ALT 31 12/24/2015   AST 33 12/24/2015   ALKPHOS 84 12/24/2015   BILITOT 0.73 12/24/2015     NARRATIVE: Auna L Coop was seen today for weekly treatment management. The chart was checked and the patient's films were reviewed.  Weekly rad  tx right breast 25/35 completed. Hyperpigmentation, skin intact. Occasional pain in the right breast, using radiipelx bid. Appetite good, fatigued in the late afternoons.  Wt Readings from Last 3 Encounters:  04/18/16 160 lb 12.8 oz (72.9 kg)  04/09/16 161 lb 12.8 oz (73.4 kg)  04/03/16 163 lb 6.4 oz (74.1 kg)  BP 110/60 (BP Location: Left Arm, Patient Position: Sitting, Cuff Size: Normal)   Pulse 65   Temp 97.7 F (36.5 C) (Oral)   Resp 16   Wt 160 lb 12.8 oz (72.9 kg)   LMP 12/26/2015   BMI 28.48 kg/m   PHYSICAL EXAMINATION: weight is 160 lb 12.8 oz (72.9 kg). Her oral temperature is 97.7 F (36.5 C). Her blood pressure is 110/60 and her pulse is 65. Her respiration is 16.   Moderate hyperpigmentation present in the treatment area without desquamation.  ASSESSMENT: The patient is doing satisfactorily with treatment.  PLAN: We will continue with the patient's radiation treatment as planned.   ------------------------------------------------  Jodelle Gross, MD, PhD  This document serves as a record of services personally performed by Kyung Rudd, MD. It was created on his behalf by Darcus Austin, a trained medical scribe. The creation of this record is based on the scribe's personal observations and the provider's statements to them. This document has been checked and approved by the attending provider.

## 2016-04-18 NOTE — Progress Notes (Signed)
Weekly rad tx right breast 25/35 completed , hyperpigmentation, skin intact, occasional pain in breast, using radiipelx bid, appetite good, energy level fatigued in late afternoons 9:36 AM BP 110/60 (BP Location: Left Arm, Patient Position: Sitting, Cuff Size: Normal)   Pulse 65   Temp 97.7 F (36.5 C) (Oral)   Resp 16   Wt 160 lb 12.8 oz (72.9 kg)   LMP 12/26/2015   BMI 28.48 kg/m   Wt Readings from Last 3 Encounters:  04/18/16 160 lb 12.8 oz (72.9 kg)  04/09/16 161 lb 12.8 oz (73.4 kg)  04/03/16 163 lb 6.4 oz (74.1 kg)

## 2016-04-21 ENCOUNTER — Ambulatory Visit
Admission: RE | Admit: 2016-04-21 | Discharge: 2016-04-21 | Disposition: A | Discharge status: Home / Self Care | Payer: Medicare Other | Source: Ambulatory Visit | Attending: Radiation Oncology | Admitting: Radiation Oncology

## 2016-04-21 DIAGNOSIS — C50411 Malignant neoplasm of upper-outer quadrant of right female breast: Secondary | ICD-10-CM | POA: Diagnosis not present

## 2016-04-22 ENCOUNTER — Ambulatory Visit
Admission: RE | Admit: 2016-04-22 | Discharge: 2016-04-22 | Disposition: A | Discharge status: Home / Self Care | Payer: Medicare Other | Source: Ambulatory Visit | Attending: Radiation Oncology | Admitting: Radiation Oncology

## 2016-04-22 DIAGNOSIS — C50411 Malignant neoplasm of upper-outer quadrant of right female breast: Secondary | ICD-10-CM | POA: Diagnosis not present

## 2016-04-23 ENCOUNTER — Ambulatory Visit
Admission: RE | Admit: 2016-04-23 | Discharge: 2016-04-23 | Disposition: A | Discharge status: Home / Self Care | Payer: Medicare Other | Source: Ambulatory Visit | Attending: Radiation Oncology | Admitting: Radiation Oncology

## 2016-04-23 DIAGNOSIS — C50411 Malignant neoplasm of upper-outer quadrant of right female breast: Secondary | ICD-10-CM | POA: Diagnosis not present

## 2016-04-24 ENCOUNTER — Ambulatory Visit
Admission: RE | Admit: 2016-04-24 | Discharge: 2016-04-24 | Disposition: A | Discharge status: Home / Self Care | Payer: Medicare Other | Source: Ambulatory Visit | Attending: Radiation Oncology | Admitting: Radiation Oncology

## 2016-04-24 ENCOUNTER — Encounter: Payer: Self-pay | Admitting: Radiation Oncology

## 2016-04-24 DIAGNOSIS — C50411 Malignant neoplasm of upper-outer quadrant of right female breast: Secondary | ICD-10-CM | POA: Diagnosis not present

## 2016-04-25 ENCOUNTER — Ambulatory Visit
Admission: RE | Admit: 2016-04-25 | Discharge: 2016-04-25 | Disposition: A | Discharge status: Home / Self Care | Payer: Medicare Other | Source: Ambulatory Visit | Attending: Radiation Oncology | Admitting: Radiation Oncology

## 2016-04-25 DIAGNOSIS — C50411 Malignant neoplasm of upper-outer quadrant of right female breast: Secondary | ICD-10-CM | POA: Diagnosis not present

## 2016-04-25 NOTE — Progress Notes (Signed)
Patient not sent to nursing to see MD per Scot Jun forgot to let patient know", tried to call patient and then went upstairs to see if I could cath her , she was gone, infomred MD 9:52 AM'

## 2016-04-28 ENCOUNTER — Ambulatory Visit
Admission: RE | Admit: 2016-04-28 | Discharge: 2016-04-28 | Disposition: A | Discharge status: Home / Self Care | Payer: Medicare Other | Source: Ambulatory Visit | Attending: Radiation Oncology | Admitting: Radiation Oncology

## 2016-04-28 ENCOUNTER — Encounter: Payer: Self-pay | Admitting: Radiation Oncology

## 2016-04-28 ENCOUNTER — Inpatient Hospital Stay
Admission: RE | Admit: 2016-04-28 | Discharge: 2016-04-28 | Disposition: A | Discharge status: Home / Self Care | Payer: Self-pay | Source: Ambulatory Visit | Attending: Radiation Oncology | Admitting: Radiation Oncology

## 2016-04-28 DIAGNOSIS — C50411 Malignant neoplasm of upper-outer quadrant of right female breast: Secondary | ICD-10-CM

## 2016-04-28 DIAGNOSIS — Z170 Estrogen receptor positive status [ER+]: Principal | ICD-10-CM

## 2016-04-28 NOTE — Progress Notes (Signed)
Weekly rad txs missed Friday,  31 completed, erythema and hyperpigmentation on breast,rash under inframmary fold, radiaplex used bid, tenderness and slight swelling stated, appetite good 9:21 AM BP 130/70 (BP Location: Left Arm, Patient Position: Sitting, Cuff Size: Normal)   Pulse 62   Temp 98.1 F (36.7 C) (Oral)   Resp 16   Wt 164 lb 9.6 oz (74.7 kg)   LMP 12/26/2015   BMI 29.16 kg/m   Wt Readings from Last 3 Encounters:  04/28/16 164 lb 9.6 oz (74.7 kg)  04/18/16 160 lb 12.8 oz (72.9 kg)  04/09/16 161 lb 12.8 oz (73.4 kg)

## 2016-04-28 NOTE — Progress Notes (Signed)
   Department of Radiation Oncology  Phone:  [phone] Fax:        [phone]  Weekly Treatment Note    Name: Michelle Bowen Date: 04/28/2016 MRN: [id number] DOB: [birth year]   Diagnosis:     ICD-9-CM ICD-10-CM   1. Malignant neoplasm of upper-outer quadrant of right breast in female, estrogen receptor positive ([place]) 174.4 C50.411    V86.0 Z17.0      Current dose: 56.4 Gy  Current fraction: 31   MEDICATIONS: Current Outpatient Prescriptions  Medication Sig Dispense Refill  . cyanocobalamin 500 MCG tablet Take 500 mcg by mouth daily.    . hyaluronate sodium (RADIAPLEXRX) GEL Apply 1 application topically 2 (two) times daily. Apply to affected skin after rad tx and bedtime daily    . Krill Oil 1000 MG CAPS Take by mouth.    . Lactobacillus (PROBIOTIC ACIDOPHILUS PO) Take by mouth.    . non-metallic deodorant Jethro Poling) MISC Apply 1 application topically daily as needed.    . triamterene-hydrochlorothiazide (MAXZIDE-25) 37.5-25 MG tablet Take 1 tablet by mouth daily.     No current facility-administered medications for this encounter.      ALLERGIES: Patient has no known allergies.   LABORATORY DATA:  Lab Results  Component Value Date   WBC 4.5 12/24/2015   HGB 12.6 12/24/2015   HCT 39.7 12/24/2015   MCV 83.0 12/24/2015   PLT 283 12/24/2015   Lab Results  Component Value Date   NA 138 02/01/2016   K 3.4 (L) 02/01/2016   CL 100 (L) 02/01/2016   CO2 29 02/01/2016   Lab Results  Component Value Date   ALT 31 12/24/2015   AST 33 12/24/2015   ALKPHOS 84 12/24/2015   BILITOT 0.73 12/24/2015     NARRATIVE: Michelle Bowen was seen today for weekly treatment management. The chart was checked and the patient's films were reviewed.  Weekly rad txt undertreat missed Friday. 31 treatments completed. Erythema and hyperpigmentation on the right breast, rash under inframmary fold, using radiaplex BID, tenderness and slight swelling stated, good appetite.  Wt Readings  from Last 3 Encounters:  04/28/16 164 lb 9.6 oz (74.7 kg)  04/18/16 160 lb 12.8 oz (72.9 kg)  04/09/16 161 lb 12.8 oz (73.4 kg)  LMP 12/26/2015   PHYSICAL EXAMINATION: vitals were not taken for this visit.  Hyperpigmentation in the treatment area without significant desquamation.  ASSESSMENT: The patient is doing satisfactorily with treatment.  PLAN: We will continue with the patient's radiation treatment as planned.   ------------------------------------------------  Jodelle Gross, MD, PhD  This document serves as a record of services personally performed by Kyung Rudd, MD. It was created on his behalf by Darcus Austin, a trained medical scribe. The creation of this record is based on the scribe's personal observations and the provider's statements to them. This document has been checked and approved by the attending provider.

## 2016-04-29 ENCOUNTER — Ambulatory Visit
Admission: RE | Admit: 2016-04-29 | Discharge: 2016-04-29 | Disposition: A | Discharge status: Home / Self Care | Payer: Medicare Other | Source: Ambulatory Visit | Attending: Radiation Oncology | Admitting: Radiation Oncology

## 2016-04-29 DIAGNOSIS — C50411 Malignant neoplasm of upper-outer quadrant of right female breast: Secondary | ICD-10-CM | POA: Diagnosis not present

## 2016-04-30 ENCOUNTER — Encounter: Payer: Self-pay | Admitting: Radiation Oncology

## 2016-04-30 ENCOUNTER — Ambulatory Visit
Admission: RE | Admit: 2016-04-30 | Discharge: 2016-04-30 | Disposition: A | Discharge status: Home / Self Care | Payer: Medicare Other | Source: Ambulatory Visit | Attending: Radiation Oncology | Admitting: Radiation Oncology

## 2016-04-30 DIAGNOSIS — C50411 Malignant neoplasm of upper-outer quadrant of right female breast: Secondary | ICD-10-CM | POA: Diagnosis not present

## 2016-05-01 ENCOUNTER — Ambulatory Visit
Admission: RE | Admit: 2016-05-01 | Discharge: 2016-05-01 | Disposition: A | Discharge status: Home / Self Care | Payer: Medicare Other | Source: Ambulatory Visit | Attending: Radiation Oncology | Admitting: Radiation Oncology

## 2016-05-01 DIAGNOSIS — C50411 Malignant neoplasm of upper-outer quadrant of right female breast: Secondary | ICD-10-CM | POA: Diagnosis not present

## 2016-05-02 ENCOUNTER — Ambulatory Visit
Admission: RE | Admit: 2016-05-02 | Discharge: 2016-05-02 | Disposition: A | Discharge status: Home / Self Care | Payer: Medicare Other | Source: Ambulatory Visit | Attending: Radiation Oncology | Admitting: Radiation Oncology

## 2016-05-02 DIAGNOSIS — C50411 Malignant neoplasm of upper-outer quadrant of right female breast: Secondary | ICD-10-CM | POA: Diagnosis not present

## 2016-05-05 ENCOUNTER — Encounter: Payer: Self-pay | Admitting: Radiation Oncology

## 2016-05-05 ENCOUNTER — Ambulatory Visit
Admission: RE | Admit: 2016-05-05 | Discharge: 2016-05-05 | Disposition: A | Discharge status: Home / Self Care | Payer: Medicare Other | Source: Ambulatory Visit | Attending: Radiation Oncology | Admitting: Radiation Oncology

## 2016-05-05 ENCOUNTER — Telehealth: Payer: Self-pay | Admitting: *Deleted

## 2016-05-05 VITALS — BP 122/66 | HR 73 | Temp 97.6°F | Resp 20 | Wt 160.0 lb

## 2016-05-05 DIAGNOSIS — Z170 Estrogen receptor positive status [ER+]: Principal | ICD-10-CM

## 2016-05-05 DIAGNOSIS — C50411 Malignant neoplasm of upper-outer quadrant of right female breast: Secondary | ICD-10-CM

## 2016-05-05 NOTE — Telephone Encounter (Signed)
Left vm to congratulate pt on completion of xrt. Contact information provided for return call

## 2016-05-05 NOTE — Progress Notes (Signed)
°  Radiation Oncology         (336) [phone] ________________________________  Name: Michelle Bowen MRN: [id number]  Date: 05/05/2016  DOB: 1941-04-01  End of Treatment Note  Diagnosis:   Breast cancer of upper-outer quadrant of right female breast ([place]). A diagnosis of breast cancer metastasized to axillary lymph node, right ([facility]) was also pertinent.    Indication for treatment:  Curative       Radiation treatment dates:   03/17/16-05/05/16  Site/dose:   1. Right breast/ 50.4 Gy in 28 fractions   2. Right breast boost/ 16 Gy in 8 fractions   3. Right axilla / 50.4 Gy in 28 fractions  Beams/energy:   1.  3D / 15X, 6X    2. Isodose plan/ 12 MeV    3. 3D / 10X, 6X  Narrative: The patient tolerated radiation treatment relatively well.   Diffuse moderate hyperpigmentation in the breast area and skin reaction in the right supraclavicular region was noted during treatment.  Plan: The patient has completed radiation treatment. The patient will return to radiation oncology clinic for routine followup in one month. I advised them to call or return sooner if they have any questions or concerns related to their recovery or treatment.  ------------------------------------------------  Jodelle Gross, MD, PhD  This document serves as a record of services personally performed by Kyung Rudd, MD. It was created on his behalf by Bethann Humble, a trained medical scribe. The creation of this record is based on the scribe's personal observations and the provider's statements to them. This document has been checked and approved by the attending provider.

## 2016-05-05 NOTE — Progress Notes (Signed)
Weekly rad txs right breast,axilla, hyperpigmentation on shoulder, under inframmary fold, breast, dryness, skin broken on subclavicular area, , using radiap[lex bid, gave samples triple antibiotic, 1 mon th f/u appt given, appetite good, energy level good 9:47 AM BP 122/66 (BP Location: Left Arm, Patient Position: Sitting, Cuff Size: Normal)   Pulse 73   Temp 97.6 F (36.4 C) (Oral)   Resp 20   Wt 160 lb (72.6 kg)   LMP 12/26/2015   BMI 28.34 kg/m   Wt Readings from Last 3 Encounters:  05/05/16 160 lb (72.6 kg)  04/28/16 164 lb 9.6 oz (74.7 kg)  04/18/16 160 lb 12.8 oz (72.9 kg)

## 2016-05-05 NOTE — Progress Notes (Signed)
  Department of Radiation Oncology  Phone:  [phone] Fax:        [phone]  Weekly Treatment Note    Name: Michelle Bowen Date: 05/05/2016 MRN: [id number] DOB: 1940/08/05   Current dose: 66.4 Gy  Current fraction: 36   MEDICATIONS: Current Outpatient Prescriptions  Medication Sig Dispense Refill  . cyanocobalamin 500 MCG tablet Take 500 mcg by mouth daily.    . hyaluronate sodium (RADIAPLEXRX) GEL Apply 1 application topically 2 (two) times daily. Apply to affected skin after rad tx and bedtime daily    . Krill Oil 1000 MG CAPS Take by mouth.    . Lactobacillus (PROBIOTIC ACIDOPHILUS PO) Take by mouth.    . non-metallic deodorant Jethro Poling) MISC Apply 1 application topically daily as needed.    . triamterene-hydrochlorothiazide (MAXZIDE-25) 37.5-25 MG tablet Take 1 tablet by mouth daily.     No current facility-administered medications for this encounter.      ALLERGIES: Patient has no known allergies.   LABORATORY DATA:  Lab Results  Component Value Date   WBC 4.5 12/24/2015   HGB 12.6 12/24/2015   HCT 39.7 12/24/2015   MCV 83.0 12/24/2015   PLT 283 12/24/2015   Lab Results  Component Value Date   NA 138 02/01/2016   K 3.4 (L) 02/01/2016   CL 100 (L) 02/01/2016   CO2 29 02/01/2016   Lab Results  Component Value Date   ALT 31 12/24/2015   AST 33 12/24/2015   ALKPHOS 84 12/24/2015   BILITOT 0.73 12/24/2015     NARRATIVE: Michelle Bowen was seen today for weekly treatment management. The chart was checked and the patient's films were reviewed. Per nurse's note: Hyperpigmentation on shoulder and under the inframmary fold, dryness and skin broken on subclavicular area. Patient notes the skin feels rough, particularly on the neck. Patient states using radiaplex bid. Patient was given samples of triple antibiotic. She notes good appetite and energy level.  PHYSICAL EXAMINATION: weight is 160 lb (72.6 kg). Her oral temperature is 97.6 F (36.4 C). Her blood  pressure is 122/66 and her pulse is 73. Her respiration is 20.      Diffuse moderate hyperpigmentation in the breast area with increased skin reaction in the right supraclavicular region.  ASSESSMENT: The patient did satisfactorily with treatment.  PLAN: The patient will follow-up in our clinic in 1 month.  This document serves as a record of services personally performed by Kyung Rudd, MD. It was created on his behalf by Bethann Humble, a trained medical scribe. The creation of this record is based on the scribe's personal observations and the provider's statements to them. This document has been checked and approved by the attending provider.

## 2016-05-05 NOTE — Progress Notes (Signed)
  Radiation Oncology         (336) [phone] ________________________________  Name: Michelle Bowen MRN: [id number]  Date: 04/16/2016  DOB: [birth year]  Complex simulation note  The patient has undergone complex simulation for her upcoming boost treatment for her diagnosis of breast cancer. The patient has initially been planned to receive 50.4 Gy. The patient will now receive a 16 Gy boost to the seroma cavity which has been contoured. This will be accomplished using an en face electron field. Based on the depth of the target area, 12 MeV electrons will be used. The patient's final total dose therefore will be 66.4 Gy. A complex isodose plan from the electron [facility] calculation is requested for the boost treatment.   _______________________________  Jodelle Gross, MD, PhD

## 2016-05-12 NOTE — Assessment & Plan Note (Deleted)
Right breast palpable lump 11/25/2015: 1:00: 4.4 cm mass, multiple abnormal axillary lymph nodes, left breast 7 mm mass (intraductal papilloma), 5 mm calcifications, borderline abnormal lymph nodes left axilla, T2 N1 stage IIB Right breast biopsy 1:00: 12/09/2015: Invasive ductal carcinoma, lymph node also positive, grade 3, ER 100%, PR 0%, Ki-67 90%, HER-2 positive ratio 4.05, gene copy #7.9  Patient refused neoadj chemo Surgery: Adj XRT  Plan: Adj Anti estrogen therapy with Anastrozole 1 mg daily RTC in 3 months

## 2016-05-16 ENCOUNTER — Ambulatory Visit: Payer: Medicare Other | Admitting: Hematology and Oncology

## 2016-05-29 NOTE — Assessment & Plan Note (Deleted)
01/17/16 Rt Lumpectomy: IDC 3.8 cm, pos Lat margin, Inf laterl marg: 1 cm IDC, focal LVI, Rt Sub areolar: IDC 0.8 cm pos ant margin, 11/21 LN, Left Lumpectomy: Papilloma 02/05/16: Rt Nipple Inferior: multiple foci of IDC, pos Sup and Inf margin; rt Nipple Superior: multiple foci of IDC, Pos Inf margin, T2N3 ER 100%, PR 0%, Ki-67 90%, HER-2 positive ratio 4.05, gene copy #7.9  Adj XRT: 03/17/16 to 05/05/16 Patient refused adjuvant chemotherapy. Patient has extremely high risk of recurrence. Patient refused genetic counseling  Recommendation: Adj anti estrogen therapy with Letrozole 2.5 mg daily 

## 2016-05-30 ENCOUNTER — Ambulatory Visit: Payer: Medicare Other | Admitting: Hematology and Oncology

## 2016-05-30 NOTE — Progress Notes (Deleted)
No care team member to display  DIAGNOSIS:  Encounter Diagnosis  Name Primary?  . Malignant neoplasm of upper-outer quadrant of right breast in female, estrogen receptor positive ([place])     SUMMARY OF ONCOLOGIC HISTORY:   Breast cancer of upper-outer quadrant of right female breast ([place])   11/29/2015 Mammogram    Right breast palpable lump: 1:00: 4.4 cm mass, multiple abnormal axillary lymph nodes, left breast 7 mm mass (intraductal papilloma), 5 mm calcifications, borderline abnormal lymph nodes left axilla, T2 N1 stage IIB      12/06/2015 Initial Biopsy    Right breast biopsy 1:00: Invasive ductal carcinoma, lymph node also positive, grade 3, ER 100%, PR 0%, Ki-67 90%, HER-2 positive ratio 4.05, gene copy #7.9      01/17/2016 Surgery    Rt Lumpectomy: IDC 3.8 cm, pos Lat margin, Inf laterl marg: 1 cm IDC, focal LVI, Rt Sub areolar: IDC 0.8 cm pos ant margin, 11/21 LN, Left Lumpectomy: Papilloma      02/05/2016 Surgery    Rt Nipple Inferior: multiple foci of IDC, pos Sup and Inf margin; rt Nipple Superior: multiple foci of IDC, Pos Inf margin, T2N3      03/17/2016 - 05/05/2016 Radiation Therapy    Adj XRT       CHIEF COMPLIANT: Follow-up after adjuvant radiation   INTERVAL HISTORY: Michelle Bowen is a  75 year old with above-mentioned history of right breast cancer who underwent lumpectomy that showed multiple positive lymph nodes. She had a total of 11 out of 21 lymph nodes were positive. She had positive margins and had to undergo second surgery on 02/05/2016. Subsequently she underwent adjuvant radiation therapy. Previously she had refused chemotherapy. She is here today to discuss the role of adjuvant antiestrogen therapy. She is healing very well from the effects of radiation   REVIEW OF SYSTEMS:   Constitutional: Denies fevers, chills or abnormal weight loss Eyes: Denies blurriness of vision Ears, nose, mouth, throat, and face: Denies mucositis or sore throat Respiratory:  Denies cough, dyspnea or wheezes Cardiovascular: Denies palpitation, chest discomfort Gastrointestinal:  Denies nausea, heartburn or change in bowel habits Skin: Denies abnormal skin rashes Lymphatics: Denies new lymphadenopathy or easy bruising Neurological:Denies numbness, tingling or new weaknesses Behavioral/Psych: Mood is stable, no new changes  Extremities: No lower extremity edema Breast: Healed radiation dermatitis  All other systems were reviewed with the patient and are negative.  I have reviewed the past medical history, past surgical history, social history and family history with the patient and they are unchanged from previous note.  ALLERGIES:  has No Known Allergies.  MEDICATIONS:  Current Outpatient Prescriptions  Medication Sig Dispense Refill  . cyanocobalamin 500 MCG tablet Take 500 mcg by mouth daily.    . hyaluronate sodium (RADIAPLEXRX) GEL Apply 1 application topically 2 (two) times daily. Apply to affected skin after rad tx and bedtime daily    . Krill Oil 1000 MG CAPS Take by mouth.    . Lactobacillus (PROBIOTIC ACIDOPHILUS PO) Take by mouth.    . non-metallic deodorant Jethro Poling) MISC Apply 1 application topically daily as needed.    . triamterene-hydrochlorothiazide (MAXZIDE-25) 37.5-25 MG tablet Take 1 tablet by mouth daily.     No current facility-administered medications for this visit.     PHYSICAL EXAMINATION: ECOG PERFORMANCE STATUS: 1 - Symptomatic but completely ambulatory  There were no vitals filed for this visit. There were no vitals filed for this visit.  GENERAL:alert, no distress and comfortable SKIN: skin color,  texture, turgor are normal, no rashes or significant lesions EYES: normal, Conjunctiva are pink and non-injected, sclera clear OROPHARYNX:no exudate, no erythema and lips, buccal mucosa, and tongue normal  NECK: supple, thyroid normal size, non-tender, without nodularity LYMPH:  no palpable lymphadenopathy in the cervical, axillary  or inguinal LUNGS: clear to auscultation and percussion with normal breathing effort HEART: regular rate & rhythm and no murmurs and no lower extremity edema ABDOMEN:abdomen soft, non-tender and normal bowel sounds MUSCULOSKELETAL:no cyanosis of digits and no clubbing  NEURO: alert & oriented x 3 with fluent speech, no focal motor/sensory deficits EXTREMITIES: No lower extremity edema  LABORATORY DATA:  I have reviewed the data as listed   Chemistry      Component Value Date/Time   NA 138 02/01/2016 1220   NA 140 12/24/2015 1024   K 3.4 (L) 02/01/2016 1220   K 3.3 (L) 12/24/2015 1024   CL 100 (L) 02/01/2016 1220   CO2 29 02/01/2016 1220   CO2 31 (H) 12/24/2015 1024   BUN 10 02/01/2016 1220   BUN 12.0 12/24/2015 1024   CREATININE 0.95 02/01/2016 1220   CREATININE 1.1 12/24/2015 1024      Component Value Date/Time   CALCIUM 9.6 02/01/2016 1220   CALCIUM 10.2 12/24/2015 1024   ALKPHOS 84 12/24/2015 1024   AST 33 12/24/2015 1024   ALT 31 12/24/2015 1024   BILITOT 0.73 12/24/2015 1024       Lab Results  Component Value Date   WBC 4.5 12/24/2015   HGB 12.6 12/24/2015   HCT 39.7 12/24/2015   MCV 83.0 12/24/2015   PLT 283 12/24/2015   NEUTROABS 2.1 12/24/2015   ASSESSMENT & PLAN:  Breast cancer of upper-outer quadrant of right female breast ([place]) 01/17/16 Rt Lumpectomy: IDC 3.8 cm, pos Lat margin, Inf laterl marg: 1 cm IDC, focal LVI, Rt Sub areolar: IDC 0.8 cm pos ant margin, 11/21 LN, Left Lumpectomy: Papilloma 02/05/16: Rt Nipple Inferior: multiple foci of IDC, pos Sup and Inf margin; rt Nipple Superior: multiple foci of IDC, Pos Inf margin, T2N3 ER 100%, PR 0%, Ki-67 90%, HER-2 positive ratio 4.05, gene copy #7.9  Adj XRT: 03/17/16 to 05/05/16 Patient refused adjuvant chemotherapy. Patient has extremely high risk of recurrence. Patient refused genetic counseling  Recommendation: Adj anti estrogen therapy with Letrozole 2.5 mg daily Letrozole counseling: We discussed  the risks and benefits of anti-estrogen therapy with aromatase inhibitors. These include but not limited to insomnia, hot flashes, mood changes, vaginal dryness, bone density loss, and weight gain. We strongly believe that the benefits far outweigh the risks. Patient understands these risks and consented to starting treatment. Planned treatment duration is 5 years.   No orders of the defined types were placed in this encounter.  The patient has a good understanding of the overall plan. she agrees with it. she will call with any problems that may develop before the next visit here.   Rulon Eisenmenger, MD 05/30/16

## 2016-06-24 ENCOUNTER — Ambulatory Visit
Admission: RE | Admit: 2016-06-24 | Discharge: 2016-06-24 | Disposition: A | Discharge status: Home / Self Care | Payer: Medicare Other | Source: Ambulatory Visit | Attending: Radiation Oncology | Admitting: Radiation Oncology

## 2016-07-11 NOTE — Progress Notes (Addendum)
Mrs. Michelle Sonner. Bowen 76 y.o. Woman with breast cancer of upper-outer quadrant of right female breast metastasized to axillary lymph node, right one month FU     Skin status:Right breast with hyperpigmentation and right upper clavicle area .  Has some soreness at times if she over uses the right arm especially the right hand. What lotion are you using? Using Radiaplex gel,plans to purchase a lotion with vitamin E. Have you seen med onc? If not, when is appointment: Dr. [name] 12-19-15,05-30-16 No show, 05-16-16 Rescheduled  If they are ER+, have they started Al or Tamoxifen? If not, why? States she does not plan to take an antiestrogen medication Discuss survivorship appointment. Not scheduled yet Have you had a mammogram scheduled? Not scheduled yet Offer referral to [facility]. Will receive at the survivorship appointment. Appetite:Good eating when she feels hungry eating 4 small meals a day. Pain:None Fatigue:Having mild fatigue at times if she does not sleep well at night. Arm mobility:Able to raise her right arm without difficulty. Lymphema:None Wt Readings from Last 3 Encounters:  07/22/16 169 lb 3.2 oz (76.7 kg)  05/05/16 160 lb (72.6 kg)  04/28/16 164 lb 9.6 oz (74.7 kg)  BP (!) 155/74   Pulse 76   Temp 98 F (36.7 C) (Oral)   Resp 18   Ht 5\' 3"  (1.6 m)   Wt 169 lb 3.2 oz (76.7 kg)   LMP 12/26/2015   SpO2 99%   BMI 29.97 kg/m

## 2016-07-22 ENCOUNTER — Encounter: Payer: Self-pay | Admitting: Radiation Oncology

## 2016-07-22 ENCOUNTER — Ambulatory Visit
Admission: RE | Admit: 2016-07-22 | Discharge: 2016-07-22 | Disposition: A | Discharge status: Home / Self Care | Payer: Medicare Other | Source: Ambulatory Visit | Attending: Radiation Oncology | Admitting: Radiation Oncology

## 2016-07-22 VITALS — BP 155/74 | HR 76 | Temp 98.0°F | Resp 18 | Ht 63.0 in | Wt 169.2 lb

## 2016-07-22 DIAGNOSIS — C50911 Malignant neoplasm of unspecified site of right female breast: Secondary | ICD-10-CM

## 2016-07-22 DIAGNOSIS — Z170 Estrogen receptor positive status [ER+]: Secondary | ICD-10-CM | POA: Diagnosis not present

## 2016-07-22 DIAGNOSIS — C50411 Malignant neoplasm of upper-outer quadrant of right female breast: Secondary | ICD-10-CM

## 2016-07-22 DIAGNOSIS — Z923 Personal history of irradiation: Secondary | ICD-10-CM | POA: Insufficient documentation

## 2016-07-22 DIAGNOSIS — C773 Secondary and unspecified malignant neoplasm of axilla and upper limb lymph nodes: Secondary | ICD-10-CM

## 2016-07-22 NOTE — Addendum Note (Signed)
Encounter addended by: Malena Edman, RN on: 07/22/2016  3:20 PM<BR>    Actions taken: Charge Capture section accepted

## 2016-07-22 NOTE — Progress Notes (Signed)
Radiation Oncology         [phone]) [phone] ________________________________  Name: Michelle Bowen MRN: [id number]  Date: 07/22/2016  DOB: 1941-03-07  Post Treatment Note  CC: No primary care provider on file.  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  Interval Since Last Radiation:  11 weeks   03/17/16-05/05/16: 1. Right breast/ 50.4 Gy in 28 fractions 2. Right breast boost/ 16 Gy in 8 fractions 3. Right axilla / 50.4 Gy in 28 fractions  Narrative:  The patient returns today for routine follow-up. In brief this is a patient with stage III T2 N3 aN0 ER positive, HER-2 ample 5 invasive ductal carcinoma of the right breast. She underwent surgical resection with a right breast lumpectomy, resection of the nipple, and lymph node dissection. Out of the 21 lymph nodes, 11 were positive for disease in her tumor was 3.8 cm. She was counseled on the role for adjuvant chemotherapy however declined after significant discussion regarding this. She has not been back to see Dr. [name] and at this point feels that she is not interested in receiving any adjuvant therapy including hormonal blockade, chemotherapy, and is not interested in following in his clinic. During radiation treatment she did very well with radiotherapy and did not have significant desquamation.                             On review of systems, the patient states she is doing well overall. She denies any concerns with her skin at this point, and used triple antibiotic ointment for her the minimal area of desquamation that she did have in the right supraclavicular region. She states that she is not having any trouble with edema of the upper extremity on the right, pain, or other concerns regarding her diagnosis. No other complaints or verbalized.  ALLERGIES:  has No Known Allergies.  Meds: Current Outpatient Prescriptions  Medication  Sig Dispense Refill  . cyanocobalamin 500 MCG tablet Take 500 mcg by mouth daily.    Javier Docker Oil 1000 MG CAPS Take by mouth.    . Lactobacillus (PROBIOTIC ACIDOPHILUS PO) Take by mouth.    . triamterene-hydrochlorothiazide (MAXZIDE-25) 37.5-25 MG tablet Take 1 tablet by mouth daily.    . hyaluronate sodium (RADIAPLEXRX) GEL Apply 1 application topically 2 (two) times daily. Apply to affected skin after rad tx and bedtime daily     No current facility-administered medications for this encounter.     Physical Findings:  height is '5\' 3"'$  (1.6 m) and weight is 169 lb 3.2 oz (76.7 kg). Her oral temperature is 98 F (36.7 C). Her blood pressure is 155/74 (abnormal) and her pulse is 76. Her respiration is 18 and oxygen saturation is 99%.  In general this is a well appearing African American female in no acute distress. She's alert and oriented x4 and appropriate throughout the examination. Cardiopulmonary assessment is negative for acute distress and she exhibits normal effort. The right breast was examined and reveals well-healed postsurgical changes, hyperpigmentation of the right breast and extending into the right axilla, and right supraclavicular region. No evidence of skin breakdown is noted.   Lab Findings: Lab Results  Component Value Date   WBC 4.5 12/24/2015   HGB 12.6 12/24/2015   HCT 39.7 12/24/2015   MCV 83.0 12/24/2015   PLT 283 12/24/2015  Radiographic Findings: No results found.  Impression/Plan: 1.  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 The patient has been doing well since completion of radiotherapy. We discussed that we would be happy to continue to follow her as needed, but stressed the importance of being followed either by medical oncology or survivorship clinic. She is interested in meeting with the nurse practitioner and survivorship clinic, and I will contact her navigator to coordinate this.  Again we also outlines the recommendations for hormonal blockade, HER-2 blockade, and at this time again she is adamant that she is not interested in discussing this any further. She was counseled on skin care as well as measures to avoid sun exposure to this area.  2. Survivorship. I discussed with the patient the options of following and survivorship clinic so that input for medical oncology is still available given her disease, and she is interested in this. We did also review that her case is not a typical survivorship case given that she did not complete adjuvant therapies, she is still interested in following this clinic.      Carola Rhine, PAC

## 2016-07-25 ENCOUNTER — Telehealth: Payer: Self-pay | Admitting: Hematology and Oncology

## 2016-07-25 NOTE — Telephone Encounter (Signed)
sched scp appt per los. lvm and mailed appt letter to pt 2/2

## 2016-08-15 ENCOUNTER — Telehealth: Payer: Self-pay | Admitting: Adult Health

## 2016-08-15 NOTE — Telephone Encounter (Signed)
Pt called to cxl SCP and not r/s at this time

## 2016-08-21 ENCOUNTER — Encounter: Payer: Medicare Other | Admitting: Adult Health

## 2016-08-21 DIAGNOSIS — C7951 Secondary malignant neoplasm of bone: Secondary | ICD-10-CM

## 2016-08-21 HISTORY — DX: Secondary malignant neoplasm of bone: C79.51

## 2016-08-25 ENCOUNTER — Telehealth: Payer: Self-pay | Admitting: Hematology and Oncology

## 2016-08-25 NOTE — Telephone Encounter (Signed)
Tamala from pt PCP called to schedule follow up appt due to new findings. Gave RN new appt date/timepr request

## 2016-08-26 ENCOUNTER — Other Ambulatory Visit: Payer: Self-pay | Admitting: Emergency Medicine

## 2016-08-26 ENCOUNTER — Encounter: Payer: Self-pay | Admitting: Hematology and Oncology

## 2016-08-26 ENCOUNTER — Other Ambulatory Visit: Payer: Self-pay | Admitting: Hematology and Oncology

## 2016-08-26 ENCOUNTER — Ambulatory Visit (HOSPITAL_BASED_OUTPATIENT_CLINIC_OR_DEPARTMENT_OTHER): Payer: Medicare Other | Admitting: Hematology and Oncology

## 2016-08-26 ENCOUNTER — Ambulatory Visit (HOSPITAL_COMMUNITY)
Admission: RE | Admit: 2016-08-26 | Discharge: 2016-08-26 | Disposition: A | Discharge status: Home / Self Care | Payer: Medicare Other | Source: Ambulatory Visit | Attending: Hematology and Oncology | Admitting: Hematology and Oncology

## 2016-08-26 DIAGNOSIS — C50411 Malignant neoplasm of upper-outer quadrant of right female breast: Secondary | ICD-10-CM | POA: Diagnosis present

## 2016-08-26 DIAGNOSIS — R918 Other nonspecific abnormal finding of lung field: Secondary | ICD-10-CM | POA: Diagnosis not present

## 2016-08-26 DIAGNOSIS — M47814 Spondylosis without myelopathy or radiculopathy, thoracic region: Secondary | ICD-10-CM | POA: Diagnosis not present

## 2016-08-26 DIAGNOSIS — C773 Secondary and unspecified malignant neoplasm of axilla and upper limb lymph nodes: Secondary | ICD-10-CM | POA: Diagnosis not present

## 2016-08-26 DIAGNOSIS — C50919 Malignant neoplasm of unspecified site of unspecified female breast: Secondary | ICD-10-CM

## 2016-08-26 DIAGNOSIS — Z170 Estrogen receptor positive status [ER+]: Secondary | ICD-10-CM

## 2016-08-26 DIAGNOSIS — M438X4 Other specified deforming dorsopathies, thoracic region: Secondary | ICD-10-CM | POA: Diagnosis not present

## 2016-08-26 DIAGNOSIS — C7951 Secondary malignant neoplasm of bone: Secondary | ICD-10-CM

## 2016-08-26 MED ORDER — GADOBENATE DIMEGLUMINE 529 MG/ML IV SOLN
15.0000 mL | Freq: Once | INTRAVENOUS | Status: AC | PRN
  Administered 2016-08-26: 15 mL via INTRAVENOUS

## 2016-08-26 MED ORDER — OXYCODONE-ACETAMINOPHEN 5-325 MG PO TABS: 1.0000 | ORAL_TABLET | Freq: Three times a day (TID) | ORAL | 0 refills | Status: DC | PRN

## 2016-08-26 MED ORDER — LETROZOLE 2.5 MG PO TABS: 2.5000 mg | ORAL_TABLET | Freq: Every day | ORAL | 3 refills | Status: DC

## 2016-08-26 MED ORDER — DEXAMETHASONE 4 MG PO TABS: 4.0000 mg | ORAL_TABLET | Freq: Two times a day (BID) | ORAL | 0 refills | Status: DC

## 2016-08-26 NOTE — Assessment & Plan Note (Signed)
01/17/2016: Rt Lumpectomy: IDC 3.8 cm, pos Lat margin, Inf laterl marg: 1 cm IDC, focal LVI, Rt Sub areolar: IDC 0.8 cm pos ant margin, 11/21 LN, Left Lumpectomy: Papilloma 02/05/2016: Rt Nipple Inferior: multiple foci of IDC, pos Sup and Inf margin; rt Nipple Superior: multiple foci of IDC, Pos Inf margin, T2N3 grade 3, ER 100%, PR 0%, Ki-67 90%, HER-2 positive ratio 4.05, gene copy #7.9 Adjuvant radiation 03/17/2016 to 05/05/2016  CT CAP 12/26/2015: 3.4 cm right breast mass extensive axillary and subpectoral lymph nodes; extensive uterine thickening, nonspecific left lower lobe pulmonary nodule.  Patient refused neoadjuvant or adjuvant chemotherapy.  Treatment plan: Adjuvant antiestrogen therapy with letrozole 2.5 mg daily Letrozole counseling:We discussed the risks and benefits of anti-estrogen therapy with aromatase inhibitors. These include but not limited to insomnia, hot flashes, mood changes, vaginal dryness, bone density loss, and weight gain. We strongly believe that the benefits far outweigh the risks. Patient understands these risks and consented to starting treatment. Planned treatment duration is 7 years.

## 2016-08-26 NOTE — Progress Notes (Signed)
No care team member to display  DIAGNOSIS:  Encounter Diagnosis  Name Primary?  . Malignant neoplasm of upper-outer quadrant of right breast in female, estrogen receptor positive ([place])     SUMMARY OF ONCOLOGIC HISTORY:   Breast cancer of upper-outer quadrant of right female breast ([facility])   11/29/2015 Mammogram    Right breast palpable lump: 1:00: 4.4 cm mass, multiple abnormal axillary lymph nodes, left breast 7 mm mass (intraductal papilloma), 5 mm calcifications, borderline abnormal lymph nodes left axilla, T2 N1 stage IIB      12/06/2015 Initial Biopsy    Right breast biopsy 1:00: Invasive ductal carcinoma, lymph node also positive, grade 3, ER 100%, PR 0%, Ki-67 90%, HER-2 positive ratio 4.05, gene copy #7.9      01/17/2016 Surgery    Rt Lumpectomy: IDC 3.8 cm, pos Lat margin, Inf laterl marg: 1 cm IDC, focal LVI, Rt Sub areolar: IDC 0.8 cm pos ant margin, 11/21 LN, Left Lumpectomy: Papilloma      02/05/2016 Surgery    Rt Nipple Inferior: multiple foci of IDC, pos Sup and Inf margin; rt Nipple Superior: multiple foci of IDC, Pos Inf margin, T2N3      03/17/2016 - 05/05/2016 Radiation Therapy    Adj XRT      08/22/2016 Relapse/Recurrence    CT Abd and pelvis: T12 and L1 metastatic disease with compression fracture of L1 with bone protrusion, concern for cord impingement, small bilateral lung nodules       CHIEF COMPLIANT: Follow-up of recently found metastatic bone lesions  INTERVAL HISTORY: Michelle Bowen is a 76 year old with above-mentioned history of ER positive and HER-2 positive breast cancer who refused chemotherapy and underwent lumpectomy followed by radiation. She had 11 out of 21 lymph nodes were positive. She did not want to go on adjuvant chemotherapy. She had completed radiation in November and did not come to see [place] because she did not want to discuss the role of adjuvant therapy. She was complaining of low back pain and she underwent CT abdomen and pelvis by her  primary care physician. The CT scan revealed T12 and L1 lesions with an L1 compression fracture. In addition there were bilateral pulmonary nodules suspicious for metastatic cancer. She rates her pain as 6-7 out of 10. She was given naproxen and emergency room which has not been helping her. She is having difficulty with gait.  REVIEW OF SYSTEMS:   Constitutional: Denies fevers, chills or abnormal weight loss Eyes: Denies blurriness of vision Ears, nose, mouth, throat, and face: Denies mucositis or sore throat Respiratory: Denies cough, dyspnea or wheezes Cardiovascular: Denies palpitation, chest discomfort Gastrointestinal:  Denies nausea, heartburn or change in bowel habits Skin: Denies abnormal skin rashes Lymphatics: Denies new lymphadenopathy or easy bruising Neurological: Gait issues, back pain intense Behavioral/Psych: Mood is stable, no new changes  Extremities: No lower extremity edema  All other systems were reviewed with the patient and are negative.  I have reviewed the past medical history, past surgical history, social history and family history with the patient and they are unchanged from previous note.  ALLERGIES:  has No Known Allergies.  MEDICATIONS:  Current Outpatient Prescriptions  Medication Sig Dispense Refill  . cyanocobalamin 500 MCG tablet Take 500 mcg by mouth daily.    Marland Kitchen dexamethasone (DECADRON) 4 MG tablet Take 1 tablet (4 mg total) by mouth 2 (two) times daily. 30 tablet 0  . hyaluronate sodium (RADIAPLEXRX) GEL Apply 1 application topically 2 (two) times daily. Apply  to affected skin after rad tx and bedtime daily    . Krill Oil 1000 MG CAPS Take by mouth.    . Lactobacillus (PROBIOTIC ACIDOPHILUS PO) Take by mouth.    . letrozole (FEMARA) 2.5 MG tablet Take 1 tablet (2.5 mg total) by mouth daily. 90 tablet 3  . oxyCODONE-acetaminophen (PERCOCET/ROXICET) 5-325 MG tablet Take 1 tablet by mouth every 8 (eight) hours as needed for severe pain. 60 tablet 0  .  triamterene-hydrochlorothiazide (MAXZIDE-25) 37.5-25 MG tablet Take 1 tablet by mouth daily.     No current facility-administered medications for this visit.     PHYSICAL EXAMINATION: ECOG PERFORMANCE STATUS: 2 - Symptomatic, <50% confined to bed  Vitals:   08/26/16 0854  BP: (!) 176/78  Pulse: 74  Resp: 18  Temp: 97.4 F (36.3 C)   Filed Weights   08/26/16 0854  Weight: 163 lb 4.8 oz (74.1 kg)    GENERAL:alert, no distress and comfortable SKIN: skin color, texture, turgor are normal, no rashes or significant lesions EYES: normal, Conjunctiva are pink and non-injected, sclera clear OROPHARYNX:no exudate, no erythema and lips, buccal mucosa, and tongue normal  NECK: supple, thyroid normal size, non-tender, without nodularity LYMPH:  no palpable lymphadenopathy in the cervical, axillary or inguinal LUNGS: clear to auscultation and percussion with normal breathing effort HEART: regular rate & rhythm and no murmurs and no lower extremity edema ABDOMEN:abdomen soft, non-tender and normal bowel sounds MUSCULOSKELETAL:no cyanosis of digits and no clubbing  NEURO: alert & oriented x 3 with fluent speech, gait difficulties, pain on both sides of the spine EXTREMITIES: No lower extremity edema   LABORATORY DATA:  I have reviewed the data as listed   Chemistry      Component Value Date/Time   NA 138 02/01/2016 1220   NA 140 12/24/2015 1024   K 3.4 (L) 02/01/2016 1220   K 3.3 (L) 12/24/2015 1024   CL 100 (L) 02/01/2016 1220   CO2 29 02/01/2016 1220   CO2 31 (H) 12/24/2015 1024   BUN 10 02/01/2016 1220   BUN 12.0 12/24/2015 1024   CREATININE 0.95 02/01/2016 1220   CREATININE 1.1 12/24/2015 1024      Component Value Date/Time   CALCIUM 9.6 02/01/2016 1220   CALCIUM 10.2 12/24/2015 1024   ALKPHOS 84 12/24/2015 1024   AST 33 12/24/2015 1024   ALT 31 12/24/2015 1024   BILITOT 0.73 12/24/2015 1024       Lab Results  Component Value Date   WBC 4.5 12/24/2015   HGB 12.6  12/24/2015   HCT 39.7 12/24/2015   MCV 83.0 12/24/2015   PLT 283 12/24/2015   NEUTROABS 2.1 12/24/2015    ASSESSMENT & PLAN:  Breast cancer of upper-outer quadrant of right female breast ([facility]) 01/17/2016: Rt Lumpectomy: IDC 3.8 cm, pos Lat margin, Inf laterl marg: 1 cm IDC, focal LVI, Rt Sub areolar: IDC 0.8 cm pos ant margin, 11/21 LN, Left Lumpectomy: Papilloma 02/05/2016: Rt Nipple Inferior: multiple foci of IDC, pos Sup and Inf margin; rt Nipple Superior: multiple foci of IDC, Pos Inf margin, T2N3 grade 3, ER 100%, PR 0%, Ki-67 90%, HER-2 positive ratio 4.05, gene copy #7.9 Adjuvant radiation 03/17/2016 to 05/05/2016  CT CAP 12/26/2015: 3.4 cm right breast mass extensive axillary and subpectoral lymph nodes; extensive uterine thickening, nonspecific left lower lobe pulmonary nodule. Patient refused neoadjuvant or adjuvant chemotherapy. CT abdomen and pelvis: T12 lytic lesions, L1 compression fracture with bone retropulsion. Concerning for cord impingement, bilateral tiny lung nodules  suspicious for metastatic breast cancer.  Treatment plan:  1. Spinal MRI to evaluate the degree of cord compression. We were able to obtain a stat appointment today because of impending cord compression symptoms  2. appointment tomorrow with Dr. Lisbeth Renshaw with radiation oncology  3. Because of concern for impending cord compression, I started on dexamethasone 4 mg by mouth twice a day. 4. For but intense back pain: I sent a prescription for Percocets 5. Start antiestrogen therapy with letrozole 2.5 mg daily 6. I recommended adding Herceptin every 3 weeks to her chemotherapy. Patient will need a port placement with Dr. Barry Dienes.  Letrozole counseling:We discussed the risks and benefits of anti-estrogen therapy with aromatase inhibitors. These include but not limited to insomnia, hot flashes, mood changes, vaginal dryness, bone density loss, and weight gain. We strongly believe that the benefits far outweigh the  risks. Patient understands these risks and consented to starting treatment. Planned treatment duration is 7 years.  Herceptin counseling: I discussed with the patient and benefits of Herceptin including risk of cardiac toxicity. I spent 25 minutes talking to the patient of which more than half was spent in counseling and coordination of care.  No orders of the defined types were placed in this encounter.  The patient has a good understanding of the overall plan. she agrees with it. she will call with any problems that may develop before the next visit here.   Rulon Eisenmenger, MD 08/26/16

## 2016-08-27 ENCOUNTER — Other Ambulatory Visit: Payer: Self-pay | Admitting: Radiation Oncology

## 2016-08-27 ENCOUNTER — Ambulatory Visit
Admission: RE | Admit: 2016-08-27 | Discharge: 2016-08-27 | Disposition: A | Discharge status: Home / Self Care | Payer: Medicare Other | Source: Ambulatory Visit | Attending: Radiation Oncology | Admitting: Radiation Oncology

## 2016-08-27 ENCOUNTER — Encounter: Payer: Self-pay | Admitting: Radiation Oncology

## 2016-08-27 VITALS — BP 166/116 | HR 79 | Temp 97.8°F | Resp 18 | Wt 162.6 lb

## 2016-08-27 DIAGNOSIS — Z170 Estrogen receptor positive status [ER+]: Principal | ICD-10-CM

## 2016-08-27 DIAGNOSIS — C7951 Secondary malignant neoplasm of bone: Secondary | ICD-10-CM

## 2016-08-27 DIAGNOSIS — C7952 Secondary malignant neoplasm of bone marrow: Secondary | ICD-10-CM

## 2016-08-27 DIAGNOSIS — Z171 Estrogen receptor negative status [ER-]: Secondary | ICD-10-CM | POA: Insufficient documentation

## 2016-08-27 DIAGNOSIS — C50911 Malignant neoplasm of unspecified site of right female breast: Secondary | ICD-10-CM | POA: Insufficient documentation

## 2016-08-27 DIAGNOSIS — C50411 Malignant neoplasm of upper-outer quadrant of right female breast: Secondary | ICD-10-CM

## 2016-08-27 NOTE — Progress Notes (Signed)
Please see the Nurse Progress Note in the MD Initial Consult Encounter for this patient. 

## 2016-08-27 NOTE — Progress Notes (Signed)
RECON  metastatic Breast with Bone Mets to T12 and L1, and Bilateral lung nodules  Radiation  Therapy from 03/17/16-05/05/16 03/17/16-05/05/16: 1. Right breast/ 50.4 Gy in 28 fractions 2. Right breast boost/ 16 Gy in 8 fractions 3. Right axilla / 50.4 Gy in 28 fractions  IMPRESSION: MRI thoracic spine 3/6/218:  1. Numerous thoracic spine metastasis. No cord, leptomeningeal, or epidural metastasis identified. 2. No significant canal stenosis.  No cord compression. 3. Right upper and lower lobe pulmonary nodules poorly characterized with MRI. CT of the chest is recommended to evaluate for metastatic disease. 4. Moderate 40% pathologic compression deformity of the L1 vertebral body with slight retropulsion. 5. Mild thoracic spine spondylosis. 6. Mild left-sided T12-L1 and L1-2 foraminal narrowing due to expansile neoplasm and pedicles and facets.  Pain  Low back near sacrum,  Cannot stand or sit long, cannot bend down stable,  8/10 scale, took oxycodone/apap 5/325mg  this am at 730am, hasn't helped yet, , letrozole not started yet, Dr. [name] gave and saw patient yesterday No nausea Bowels regular, bladder okay, appetite fair, mild fatigue BP (!) 166/116 (BP Location: Right Arm, Patient Position: Sitting, Cuff Size: Normal)   Pulse 79   Temp 97.8 F (36.6 C) (Oral)   Resp 18   Wt 162 lb 9.6 oz (73.8 kg)   LMP 12/26/2015   BMI 28.80 kg/m   Wt Readings from Last 3 Encounters:  08/27/16 162 lb 9.6 oz (73.8 kg)  08/26/16 163 lb 4.8 oz (74.1 kg)  07/22/16 169 lb 3.2 oz (76.7 kg)

## 2016-08-27 NOTE — Progress Notes (Signed)
Radiation Oncology         [phone] ________________________________  Name: Michelle Bowen MRN: [id number]  Date: 08/27/2016  DOB: [birth year]  Post Treatment Note  CC: No primary care provider on file.  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  Interval Since Last Radiation:  11 weeks   03/17/16-05/05/16: 1. Right breast/ 50.4 Gy in 28 fractions 2. Right breast boost/ 16 Gy in 8 fractions 3. Right axilla / 50.4 Gy in 28 fractions  Narrative: In brief this is a patient with stage III T2 N3 aN0 ER positive, HER-2 ample 5 invasive ductal carcinoma of the right breast. She underwent surgical resection with a right breast lumpectomy, resection of the nipple, and lymph node dissection. Out of the 21 lymph nodes, 11 were positive for disease in her tumor was 3.8 cm. She was counseled on the role for adjuvant chemotherapy however declined after significant discussion regarding this. She has not been back to see Dr. [name] and at this point feels that she is not interested in receiving any adjuvant therapy including hormonal blockade, chemotherapy, and is not interested in following in his clinic. During radiation treatment she did very well with radiotherapy and did not have significant desquamation. She ultimately experienced back pain and was seen at [facility] in the ED on 3/2/18and a CT abdomen/pelvis revealed a right lower lobe nodule at 5.5 mm, and a lytic lesion at T12 and L1. She was seen by Dr. [name] and an MRI of the thoracic spine on 08/26/16 revealed concerns for metastatic disease with the largest lesions at T6, T8, T12, and L1 with 40% pathologic compression deformity, and foraminal narrowing from T12-L1 and L1-2.  No cord abnormalitiy or edema was noted. She was started on steroids, and comes today to discuss possible palliative radiotherapy to the spine.     On review of systems, the  patient reports that she is doing okay. She reports that she has taken one dose of percocet in the last 12 hours. She reports that she is concerned about still having pain. She denies any chest pain, shortness of breath, cough, fevers, chills, night sweats, unintended weight changes. Her back pain is described as bilateral along the chest wall and around from the spine towards the middle of her anterior chest. She denies weakness, loss of bowel or bladder control, foot drop, or gait disturbances. She denies any bowel or bladder disturbances, and denies abdominal pain, nausea or vomiting. She denies any other musculoskeletal or joint aches or pains, new skin lesions or concerns. A complete review of systems is obtained and is otherwise negative.  Past Medical History:  Past Medical History:  Diagnosis Date  . Arthritis    bil knees, left thumb  . Breast cancer ([place]) 12/06/15   Right  . Hypertension     Past Surgical History: Past Surgical History:  Procedure Laterality Date  . BREAST LUMPECTOMY WITH AXILLARY LYMPH NODE DISSECTION Right 01/17/2016   Procedure: RIGHT BREAST LUMPECTOMY WITH AXILLARY LYMPH NODE DISSECTION;  Surgeon: [name], MD;  Location: [facility];  Service: General;  Laterality: Right;  . BREAST SURGERY Right 2001   sebaceous cyst removal  . RADIOACTIVE SEED GUIDED EXCISIONAL BREAST BIOPSY Left 01/17/2016   Procedure: RADIOACTIVE SEED GUIDED EXCISIONAL LEFT BREAST BIOPSY;  Surgeon: [name], MD;  Location: [facility];  Service: General;  Laterality: Left;  .  RE-EXCISION OF BREAST LUMPECTOMY Right 02/05/2016   Procedure: RE-EXCISION OF BREAST LUMPECTOMY AND REMOVAL OF NIPPLE;  Surgeon: [name], MD;  Location: [facility];  Service: General;  Laterality: Right;  . TONSILLECTOMY      Social History:  Social History   Social History  . Marital status: Single    Spouse name: N/A  . Number of children: N/A  . Years of  education: N/A   Occupational History  . Not on file.   Social History Main Topics  . Smoking status: Former Research scientist (life sciences)  . Smokeless tobacco: Never Used  . Alcohol use Yes     Comment: social  . Drug use: No     Comment: smoke 1pack per week 7y, quit age 15  . Sexual activity: No   Other Topics Concern  . Not on file   Social History Narrative  . No narrative on file    Family History: Family History  Problem Relation Age of Onset  . Breast cancer Sister 30    breast  . Breast cancer      neice at 46  . Breast cancer      neice at 50     ALLERGIES:  has No Known Allergies.  Meds: Current Outpatient Prescriptions  Medication Sig Dispense Refill  . dexamethasone (DECADRON) 4 MG tablet Take 1 tablet (4 mg total) by mouth 2 (two) times daily. 30 tablet 0  . oxyCODONE-acetaminophen (PERCOCET/ROXICET) 5-325 MG tablet Take 1 tablet by mouth every 8 (eight) hours as needed for severe pain. 60 tablet 0  . triamterene-hydrochlorothiazide (MAXZIDE-25) 37.5-25 MG tablet Take 1 tablet by mouth daily.    . cyanocobalamin 500 MCG tablet Take 500 mcg by mouth daily.    . hyaluronate sodium (RADIAPLEXRX) GEL Apply 1 application topically 2 (two) times daily. Apply to affected skin after rad tx and bedtime daily    . Krill Oil 1000 MG CAPS Take by mouth.    . Lactobacillus (PROBIOTIC ACIDOPHILUS PO) Take by mouth.    . letrozole (FEMARA) 2.5 MG tablet Take 1 tablet (2.5 mg total) by mouth daily. (Patient not taking: Reported on 08/27/2016) 90 tablet 3   No current facility-administered medications for this encounter.     Physical Findings:  weight is 162 lb 9.6 oz (73.8 kg). Her oral temperature is 97.8 F (36.6 C). Her blood pressure is 166/116 (abnormal) and her pulse is 79. Her respiration is 18.  In general this is a well appearing African American female in no acute distress. She is alert and oriented x4 and appropriate throughout the examination. HEENT reveals that the patient is  normocephalic, atraumatic. EOMs are intact. PERRLA. Skin is intact without any evidence of gross lesions. Cardiopulmonary assessment is negative for acute distress and she exhibits normal effort. She has reproducible pain at the mid thoracic spine along the T9 region as well as pain in the low thoracolumbar region at the midline. Lower extremities are negative for pretibial pitting edema, deep calf tenderness, cyanosis or clubbing. Bilateral upper and lower extremities are assessed and reveal 5/5 strength, with intact perception to light touch.    Lab Findings: Lab Results  Component Value Date   WBC 4.5 12/24/2015   HGB 12.6 12/24/2015   HCT 39.7 12/24/2015   MCV 83.0 12/24/2015   PLT 283 12/24/2015     Radiographic Findings: Mr Thoracic Spine W Wo Contrast  Result Date: 08/26/2016 CLINICAL DATA:  76 y/o F; history of breast cancer with new  back and flank pain. EXAM: MRI THORACIC WITHOUT AND WITH CONTRAST TECHNIQUE: Multiplanar and multiecho pulse sequences of the thoracic spine were obtained without and with intravenous contrast. CONTRAST:  6m MULTIHANCE GADOBENATE DIMEGLUMINE 529 MG/ML IV SOLN COMPARISON:  12/27/2015 CT of the chest. 08/22/2016 CT abdomen and pelvis. FINDINGS: MRI THORACIC SPINE FINDINGS Alignment: Mild dextrocurvature with apex at L10. Normal thoracic kyphosis. No listhesis. Vertebrae: Indication there are several T1 hypointense foci with variable enhancement in the vertebral bodies, pedicles, and spinous process throughout the thoracic spine compatible with metastatic disease. The largest lesions are present at the T6, T8, T12, and L1 levels within the vertebral bodies. There is a moderate 40% pathologic compression deformity of the L1 vertebral body with slight retropulsion of the posterior aspect of the vertebral body into the spinal canal. Cord: No abnormal cord signal or cord compression. No cord or leptomeningeal enhancement identified. No significant epidural enhancing  disease. Paraspinal and other soft tissues: Right lower lobe pulmonary nodule (series 12, image 15) and right upper lobe pulmonary nodule (series 12 image 17). Disc levels: There are multilevel small disc bulges or central protrusions throughout the thoracic spine the largest at the T10-11 and T11-12 levels. There is no significant canal stenosis. On the left at T12-L1 and L1-2 there is mild foraminal narrowing due to expansile neoplasm within the left T12 and L1 pedicles and the L2 superior articular facet. IMPRESSION: 1. Numerous thoracic spine metastasis. No cord, leptomeningeal, or epidural metastasis identified. 2. No significant canal stenosis.  No cord compression. 3. Right upper and lower lobe pulmonary nodules poorly characterized with MRI. CT of the chest is recommended to evaluate for metastatic disease. 4. Moderate 40% pathologic compression deformity of the L1 vertebral body with slight retropulsion. 5. Mild thoracic spine spondylosis. 6. Mild left-sided T12-L1 and L1-2 foraminal narrowing due to expansile neoplasm and pedicles and facets. Electronically Signed   By: LKristine GarbeM.D.   On: 08/26/2016 15:54    Impression/Plan: 1.  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 Dr. MLisbeth Renshawreviews her history and recent imaging findings. He agrees with proceeding with radiotherapy to the spine. He would recommend proceeding with a course of 30 Gy in 10 fractions to the thoracolumbar region. He also suggests proceeding with either chest CT or PET for further metastatic work up. She will continue Letrozole with Dr. GLindi Adieas well. We discussed the risks, benefits, short, and long term effects of radiotherapy, and the patient is interested in proceeding. Dr. MLisbeth Renshawdiscusses the delivery and logistics of radiotherapy.  Written consent was obtained,  and we will proceed with simulation on 09/01/16. 2. Pain secondary to #1. The patient was encouraged to consider percocet every 4-6 hours prn. We will reassess this if she feels her symptoms are not well controlled. 3. Hypertension. The patient is anxious and with rechecking her BP with manual cuff, this improved. She will follow up with her PCP.   The above documentation reflects my direct findings during this shared patient visit. Please see the separate note by Dr. MLisbeth Renshawon this date for the remainder of the patient's plan of care.     ACarola Rhine PAC

## 2016-08-28 ENCOUNTER — Telehealth: Payer: Self-pay | Admitting: *Deleted

## 2016-08-28 NOTE — Telephone Encounter (Signed)
Called patient to inform of Pet Scan for 09-05-16- arrival time - 1:30 pm, pt. To be NPO -6 hrs. Prior to test , test to be @ [facility], spoke with patient and she is aware of this test.

## 2016-08-28 NOTE — Telephone Encounter (Signed)
xxxx 

## 2016-08-29 ENCOUNTER — Telehealth: Payer: Self-pay

## 2016-08-29 NOTE — Telephone Encounter (Signed)
Pt called in to notify Dr.Gudena that the percocet that was prescribed to her from her last appointment did not agree with her. Pt states that she was having severe itching, rash nausea since she started taking it Tuesday for the first time. Pt states that she stopped taking it and started taking her Oxycontin medication that she had left over from her surgery last august. Pt states that she only takes it once every 8-12 hrs. Pt cannot go without pain medication with her current pain. Told pt that Dr.Gudena is out til Monday and will notify him of pt allergic reaction to percocet.

## 2016-09-01 ENCOUNTER — Ambulatory Visit: Payer: Medicare Other | Admitting: Radiation Oncology

## 2016-09-01 ENCOUNTER — Telehealth: Payer: Self-pay | Admitting: Hematology and Oncology

## 2016-09-01 NOTE — Telephone Encounter (Signed)
Patient called to cancel Rad onc appt. And was unable to reach rad onc scheduling for transfer. Took message and called Rad onc later to relay message. Scheduler Veda in Rad onc received message.

## 2016-09-04 ENCOUNTER — Ambulatory Visit: Admission: RE | Admit: 2016-09-04 | Payer: Medicare Other | Source: Ambulatory Visit | Admitting: Radiation Oncology

## 2016-09-05 ENCOUNTER — Encounter (HOSPITAL_COMMUNITY): Payer: Medicare Other

## 2016-09-09 ENCOUNTER — Telehealth: Payer: Self-pay | Admitting: Hematology and Oncology

## 2016-09-09 NOTE — Telephone Encounter (Signed)
FAXED RECORDS TO [facility] RELEASE ID [id number]

## 2016-09-10 ENCOUNTER — Telehealth: Payer: Self-pay | Admitting: *Deleted

## 2016-09-10 NOTE — Telephone Encounter (Signed)
On 09-10-16 fax medical records to [facility],  And dos will do there part

## 2016-09-11 ENCOUNTER — Telehealth: Payer: Self-pay | Admitting: *Deleted

## 2016-09-11 NOTE — Telephone Encounter (Signed)
On 09-11-16 fax medical records to [facility], release id [id number]

## 2016-09-15 ENCOUNTER — Telehealth: Payer: Self-pay | Admitting: *Deleted

## 2016-09-15 NOTE — Telephone Encounter (Signed)
"  Michelle Bowen with [facility] calling to determine if our request for medical records was received?"  Call transferred H.I.M.

## 2016-09-22 ENCOUNTER — Telehealth: Payer: Self-pay | Admitting: Hematology and Oncology

## 2016-09-22 NOTE — Telephone Encounter (Signed)
FAXED RECORDS TO [facility] RELEASE ID [id number]

## 2016-11-05 ENCOUNTER — Encounter (HOSPITAL_COMMUNITY): Payer: Self-pay

## 2017-04-08 ENCOUNTER — Telehealth: Payer: Self-pay

## 2017-04-08 NOTE — Telephone Encounter (Signed)
Received call from pt today to notify Dr.Gudena that she had moved back from [place] and would like to get back to seeing Dr.Gudena and continuing herceptin treatments. Pt states that she had moved with her son to [place], after finding out that she had metastatic cancer beginning of the year. Pt went to get chemo, radiation and back surgery in [place] and had completed therapy there. Now that she is back in [place], she would like to resume outpatient physical therapy and to see Dr.Gudena to plan future treatment. Told pt that we will need to see her soon, but will need all medical records of her treatment and care from [facility] so Dr.Gudena could review them prior to meeting for an appt. Pt verbalized understanding and would like to drop off her medical records tomorrow at the office. No further concerns or questions at this time.

## 2017-04-20 ENCOUNTER — Ambulatory Visit: Payer: Medicare Other | Attending: Neurological Surgery | Admitting: Physical Therapy

## 2017-04-20 DIAGNOSIS — R2689 Other abnormalities of gait and mobility: Secondary | ICD-10-CM

## 2017-04-20 DIAGNOSIS — R29898 Other symptoms and signs involving the musculoskeletal system: Secondary | ICD-10-CM | POA: Insufficient documentation

## 2017-04-20 DIAGNOSIS — M6281 Muscle weakness (generalized): Secondary | ICD-10-CM | POA: Diagnosis present

## 2017-04-20 DIAGNOSIS — R2681 Unsteadiness on feet: Secondary | ICD-10-CM | POA: Diagnosis present

## 2017-04-20 NOTE — Therapy (Signed)
[facility] [address]  [place], [place], [address] Phone: [phone]   Fax:  [phone]  Physical Therapy Evaluation  Patient Details  Name: Michelle Bowen MRN: [id number] Date of Birth: 1940/08/31 Referring Provider: Dr. Sharyn Lull  Encounter Date: 04/20/2017      PT End of Session - 04/20/17 1518    Visit Number 1   Number of Visits 12   Date for PT Re-Evaluation 06/01/17   Authorization Type Medicare   PT Start Time 1402   PT Stop Time 1500   PT Time Calculation (min) 58 min   Activity Tolerance Patient tolerated treatment well   Behavior During Therapy [facility] for tasks assessed/performed      Past Medical History:  Diagnosis Date  . Arthritis    bil knees, left thumb  . Breast cancer ([place]) 12/06/15   Right  . Hypertension     Past Surgical History:  Procedure Laterality Date  . BREAST LUMPECTOMY WITH AXILLARY LYMPH NODE DISSECTION Right 01/17/2016   Procedure: RIGHT BREAST LUMPECTOMY WITH AXILLARY LYMPH NODE DISSECTION;  Surgeon: [name], MD;  Location: [place];  Service: General;  Laterality: Right;  . BREAST SURGERY Right 2001   sebaceous cyst removal  . RADIOACTIVE SEED GUIDED EXCISIONAL BREAST BIOPSY Left 01/17/2016   Procedure: RADIOACTIVE SEED GUIDED EXCISIONAL LEFT BREAST BIOPSY;  Surgeon: [name], MD;  Location: [place];  Service: General;  Laterality: Left;  . RE-EXCISION OF BREAST LUMPECTOMY Right 02/05/2016   Procedure: RE-EXCISION OF BREAST LUMPECTOMY AND REMOVAL OF NIPPLE;  Surgeon: [name], MD;  Location: [place];  Service: General;  Laterality: Right;  . TONSILLECTOMY      There were no vitals filed for this visit.       Subjective Assessment - 04/20/17 1405    Subjective L1 burst fracture. had spinal surgery 03/02/17 - uses rollator all the time - feels like she is now more dependent on rollator. Prior to  surgery was able to intermittently use rollator vs. hold onto family member vs. SPC. Lives alone - does have a friend that helps out. 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 Currently in back brace - supposed to wear for 2 more months. Some difficulty sleeping. 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 10 minutes   How long can you walk comfortably? 30 minutes   Diagnostic tests none recently   Patient Stated Goals reduce use of rollator, regain strength   Currently in Pain? 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 Sharyn Lull   Onset Date/Surgical Date 03/02/17   Next MD Visit prn   Prior Therapy yes     Precautions   Precautions Back   Precaution Comments no bending, twisting, lifitng greater than 15-20#   Required Braces or Orthoses Spinal Brace   Spinal Brace Lumbar corset     Restrictions   Weight Bearing Restrictions No     Balance Screen   Has the patient fallen in the past 6 months No   Has the patient had a decrease  in activity level because of a fear of falling?  No   Is the patient reluctant to leave their home because of a fear of falling?  No     Home Environment   Living Environment Private residence   Living Arrangements Alone   Available Help at Discharge Friend(s);Family   Type of Home Apartment   Home Access Level entry   Home Layout One level   [place] - 4 wheels;Cane - single point;Grab bars - tub/shower;Shower seat     Prior Function   Level of Independence Independent   Vocation Retired   Leisure church work     Charity fundraiser Status Within [employer] for tasks assessed      Observation/Other Assessments   Focus on Therapeutic Outcomes (FOTO)  Neuro: 56 (44% limited, predicted 38% limited)     Sensation   Light Touch Impaired by gross assessment   Additional Comments reports neuropathy at B hands and feet     Coordination   Gross Motor Movements are Fluid and Coordinated Yes     Posture/Postural Control   Posture/Postural Control Postural limitations   Postural Limitations Rounded Shoulders;Forward head     ROM / Strength   AROM / PROM / Strength Strength     Strength   Strength Assessment Site Hip;Knee;Ankle   Right/Left Hip Right;Left   Right Hip Flexion 3+/5   Right Hip ABduction 4/5   Right Hip ADduction 4/5   Left Hip Flexion 3+/5   Left Hip ABduction 4/5   Left Hip ADduction 4/5   Right/Left Knee Right;Left   Right Knee Flexion 4/5   Right Knee Extension 4+/5   Left Knee Flexion 4-/5   Left Knee Extension 5/5   Right/Left Ankle Right;Left   Right Ankle Dorsiflexion 4/5   Left Ankle Dorsiflexion 4+/5     Transfers   Five time sit to stand comments  --     Ambulation/Gait   Ambulation/Gait Yes   Ambulation/Gait Assistance 6: Modified independent (Device/Increase time)   Ambulation Distance (Feet) 150 Feet   Assistive device None  rollator   Gait Pattern Step-through pattern;Decreased stride length;Decreased hip/knee flexion - right;Decreased hip/knee flexion - left;Lateral trunk lean to right   Ambulation Surface Level;Indoor   Gait velocity --  slower than normal     Balance   Balance Assessed Yes     Standardized Balance Assessment   Standardized Balance Assessment Berg Balance Test;Five Times Sit to Stand;Timed Up and Go Test   Five times sit to stand comments  21.56 seconds     Berg Balance Test   Sit to Stand Able to stand without using hands and stabilize independently   Standing Unsupported Able to stand safely 2 minutes   Sitting with Back Unsupported but Feet Supported on Floor or Stool Able to sit safely and  securely 2 minutes   Stand to Sit Sits safely with minimal use of hands   Transfers Able to transfer safely, minor use of hands   Standing Unsupported with Eyes Closed Able to stand 10 seconds with supervision   Standing Ubsupported with Feet Together Able to place feet together independently and stand for 1 minute with supervision   From Standing, Reach Forward with Outstretched Arm Can reach forward >12 cm safely (5")   From Standing Position, Pick up Object from Floor Unable to pick up shoe, but reaches 2-5 cm (1-2") from shoe and balances independently   From Standing Position, Turn to Look  Behind Over each Shoulder Turn sideways only but maintains balance   Turn 360 Degrees Able to turn 360 degrees safely but slowly   Standing Unsupported, Alternately Place Feet on Step/Stool Able to complete >2 steps/needs minimal assist   Standing Unsupported, One Foot in Front Able to take small step independently and hold 30 seconds   Standing on One Leg Tries to lift leg/unable to hold 3 seconds but remains standing independently   Total Score 39     Timed Up and Go Test   Normal TUG (seconds) 16.46            Objective measurements completed on examination: See above findings.          [facility] Adult PT Treatment/Exercise - 04/20/17 1422      Exercises   Exercises Knee/Hip     Knee/Hip Exercises: Seated   Long Arc Quad Right;Left;10 reps   Long Arc Quad Limitations 3 sec hold   Abduction/Adduction  Both;15 reps   Abd/Adduction Limitations add pillow squeeze - 5 sec hold                PT Education - 04/20/17 1455    Education provided Yes   Education Details exam findings, POC, HEP   Person(s) Educated Patient   Methods Explanation;Demonstration;Handout   Comprehension Verbalized understanding;Returned demonstration;Need further instruction          PT Short Term Goals - 04/20/17 1805      PT SHORT TERM GOAL #1   Title patient to be independent with initial  HEP for strength and balance   Status New   Target Date 05/11/17     PT SHORT TERM GOAL #2   Title Patient to improve TUG to 13.5 seconds demonstrating reduced fall risk   Status New   Target Date 05/11/17           PT Long Term Goals - 04/20/17 1806      PT LONG TERM GOAL #1   Title patient to improve B LE strength to >/= 4/5 needed for improved functional mobility   Status New   Target Date 06/01/17     PT LONG TERM GOAL #2   Title patient to improve Berg Balance score to 46/56 demonstrating reduced fall risk   Status New   Target Date 06/01/17     PT LONG TERM GOAL #3   Title patient to demonstrate SL stance of >/= 6 seconds   Status New   Target Date 06/01/17     PT LONG TERM GOAL #4   Title patient to ambulate on various surfaces with SPC with no evidence of LOB or instability   Status New   Target Date 06/01/17     PT LONG TERM GOAL #5   Title Patient to initiate and maintain walking program for exercise for 3-5 days/week    Status New   Target Date 06/01/17                Plan - 04/20/17 1746    Clinical Impression Statement Patient is a 76 y/o female presenting to [facility] today s/p L1 corpectomy and T12-L2 stabilization/fusion on 03/02/17 due to L1 burst fracture. 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 Patient today ambulating with rollator with subjective reports of general instability and weakness requiring assistive device. Patient today scoring in high fall risk categories for TUG and Berg Balance Scale, as well as demonstrating  some abnormal gait mechanics with tendency to lateral lean as well as reduced hip/knee flexion likely due to general fatigue and weakness. Patient currently in back brace with precautions to wear brace for 2 additional months as well as no bending, twisting, or lifting greater than 15-20#, and no core work to be initiated at this time. Patient to benefit  from skilled PT intervention to address B LE strength, gait mechanics, as well as balance to allow for impoved functional mobility.    Clinical Presentation Evolving   Clinical Presentation due to: hx of breast cancer with mestastasis, spinal surgery with precautions in place, HTN   Clinical Decision Making Moderate   Rehab Potential Good   PT Frequency 2x / week   PT Duration 6 weeks   PT Treatment/Interventions ADLs/Self Care Home Management;Cryotherapy;Electrical Stimulation;Moist Heat;Ultrasound;Neuromuscular re-education;Balance training;Therapeutic exercise;Therapeutic activities;Functional mobility training;Stair training;Gait training;Patient/family education;Manual techniques;Passive range of motion;Vasopneumatic Device;Taping;Dry needling   PT Next Visit Plan no bending, twisting, lifting greater than 15-20#   Consulted and Agree with Plan of Care Patient      Patient will benefit from skilled therapeutic intervention in order to improve the following deficits and impairments:  Abnormal gait, Decreased activity tolerance, Decreased balance, Decreased range of motion, Decreased mobility, Decreased strength, Difficulty walking  Visit Diagnosis: Unsteadiness on feet - Plan: PT plan of care cert/re-cert  Other abnormalities of gait and mobility - Plan: PT plan of care cert/re-cert  Muscle weakness (generalized) - Plan: PT plan of care cert/re-cert  Other symptoms and signs involving the musculoskeletal system - Plan: PT plan of care cert/re-cert      G-Codes - 88/89/16 1522    Functional Assessment Tool Used (Outpatient Only) FOTO: 56 (44% limited)   Functional Limitation Mobility: Walking and moving around   Mobility: Walking and Moving Around Current Status ([id number]) At least 40 percent but less than 60 percent impaired, limited or restricted   Mobility: Walking and Moving Around Goal Status [phone]) At least 20 percent but less than 40 percent impaired, limited or restricted        Problem List Patient Active Problem List   Diagnosis Date Noted  . Breast cancer of upper-outer quadrant of right female breast ([place]) 12/17/2015     Lanney Gins, PT, DPT 04/20/17 6:11 PM   [facility] [address]  [place], [place], [address] Phone: [phone]   Fax:  [phone]  Name: Michelle Bowen MRN: [id number] Date of Birth: [birth year]

## 2017-04-20 NOTE — Patient Instructions (Signed)
KNEE: Extension, Long Arc Quads - Sitting   Raise leg until knee is straight. __15_ reps per set, __2_ sets per day  Sit to Stand / Stand to Sit / Transfers   Sit on edge of a solid chair with arms, feet flat on floor. Lean forward over feet and stand up with hands on chair arms. Sit down slowly with hands on chair arms. Repeat __10-15__ times per session. Do __2__ sessions per day.  Adduction: Hip - Knees Together (Sitting)   Sit with towel roll between knees. Push knees together. Hold for __5_ seconds.  Repeat _15__ times. Do _2__ times a day.

## 2017-04-21 ENCOUNTER — Telehealth: Payer: Self-pay | Admitting: Hematology and Oncology

## 2017-04-21 NOTE — Telephone Encounter (Signed)
Patient scheduled per 10/25 sch message. Spoke to patient regarding update to schedule.

## 2017-04-22 ENCOUNTER — Other Ambulatory Visit: Payer: Self-pay

## 2017-04-22 ENCOUNTER — Ambulatory Visit: Payer: Medicare Other

## 2017-04-22 DIAGNOSIS — R2681 Unsteadiness on feet: Secondary | ICD-10-CM

## 2017-04-22 DIAGNOSIS — R2689 Other abnormalities of gait and mobility: Secondary | ICD-10-CM

## 2017-04-22 DIAGNOSIS — Z170 Estrogen receptor positive status [ER+]: Principal | ICD-10-CM

## 2017-04-22 DIAGNOSIS — C50411 Malignant neoplasm of upper-outer quadrant of right female breast: Secondary | ICD-10-CM

## 2017-04-22 DIAGNOSIS — M6281 Muscle weakness (generalized): Secondary | ICD-10-CM

## 2017-04-22 DIAGNOSIS — R29898 Other symptoms and signs involving the musculoskeletal system: Secondary | ICD-10-CM

## 2017-04-22 NOTE — Therapy (Signed)
[facility] [address]  [place], [place], [address] Phone: [phone]   Fax:  [phone]  Physical Therapy Treatment  Patient Details  Name: Michelle Bowen MRN: [id number] Date of Birth: 1940-08-01 Referring Provider: Dr. Sharyn Lull  Encounter Date: 04/22/2017      PT End of Session - 04/22/17 1416    Visit Number 2   Number of Visits 12   Date for PT Re-Evaluation 06/01/17   Authorization Type Medicare   PT Start Time 1406   PT Stop Time 1445   PT Time Calculation (min) 39 min   Activity Tolerance Patient tolerated treatment well   Behavior During Therapy [facility] for tasks assessed/performed      Past Medical History:  Diagnosis Date  . Arthritis    bil knees, left thumb  . Breast cancer ([place]) 12/06/15   Right  . Hypertension     Past Surgical History:  Procedure Laterality Date  . BREAST LUMPECTOMY WITH AXILLARY LYMPH NODE DISSECTION Right 01/17/2016   Procedure: RIGHT BREAST LUMPECTOMY WITH AXILLARY LYMPH NODE DISSECTION;  Surgeon: [name], MD;  Location: [place];  Service: General;  Laterality: Right;  . BREAST SURGERY Right 2001   sebaceous cyst removal  . RADIOACTIVE SEED GUIDED EXCISIONAL BREAST BIOPSY Left 01/17/2016   Procedure: RADIOACTIVE SEED GUIDED EXCISIONAL LEFT BREAST BIOPSY;  Surgeon: [name], MD;  Location: [place];  Service: General;  Laterality: Left;  . RE-EXCISION OF BREAST LUMPECTOMY Right 02/05/2016   Procedure: RE-EXCISION OF BREAST LUMPECTOMY AND REMOVAL OF NIPPLE;  Surgeon: [name], MD;  Location: [place];  Service: General;  Laterality: Right;  . TONSILLECTOMY      There were no vitals filed for this visit.      Subjective Assessment - 04/22/17 1411    Subjective Michelle Bowen doing well no new complaints.  Reports has not tried adduction ball squeeze.   Patient Stated Goals reduce use of rollator, regain  strength   Currently in Pain? No/denies   Multiple Pain Sites No                         [facility] Adult PT Treatment/Exercise - 04/22/17 1434      Knee/Hip Exercises: Aerobic   Nustep Lvl 4, 6 min      Knee/Hip Exercises: Standing   Knee Flexion Right;Left;10 reps   Knee Flexion Limitations in rollator    Hip Flexion Right;Left;Knee bent;10 reps   Hip Flexion Limitations holding onto chair    Hip Abduction Right;Left;10 reps;Knee straight   Abduction Limitations holding onto chair    Hip Extension Right;Left;10 reps;Knee straight   Extension Limitations holding onto chair with slight forward trunk flexion      Knee/Hip Exercises: Seated   Clamshell with TheraBand Yellow  x 10 reps    Marching Limitations x 10 reps each side    Hamstring Curl Left;Right;10 reps   Hamstring Limitations red TB    Abduction/Adduction  Both;15 reps  pt. has not attempted at home   Abd/Adduction Limitations 5" hold - add pillow squeeze                 PT Education - 04/22/17 1811    Education provided Yes   Education Details standing heel raise in rollator, seated hip abduction/ER with single yellow TB issued to pt.    Person(s) Educated Patient   Methods Explanation;Demonstration;Verbal cues;Handout  Comprehension Verbalized understanding;Returned demonstration;Verbal cues required;Need further instruction          PT Short Term Goals - 04/22/17 1425      PT SHORT TERM GOAL #1   Title patient to be independent with initial HEP for strength and balance   Status On-going     PT SHORT TERM GOAL #2   Title Patient to improve TUG to 13.5 seconds demonstrating reduced fall risk   Status On-going           PT Long Term Goals - 04/22/17 1425      PT LONG TERM GOAL #1   Title patient to improve B LE strength to >/= 4/5 needed for improved functional mobility   Status On-going     PT LONG TERM GOAL #2   Title patient to improve Berg Balance score to 46/56  demonstrating reduced fall risk   Status On-going     PT LONG TERM GOAL #3   Title patient to demonstrate SL stance of >/= 6 seconds   Status On-going     PT LONG TERM GOAL #4   Title patient to ambulate on various surfaces with SPC with no evidence of LOB or instability   Status On-going     PT LONG TERM GOAL #5   Title Patient to initiate and maintain walking program for exercise for 3-5 days/week    Status On-going               Plan - 04/22/17 1447    Clinical Impression Statement Michelle Bowen reporting no issues with HEP however has not yet attempted adduction squeeze.  Tolerated progression of seated and standing LE strengthening activities well today including 3-way SLR kick out.  Cueing provided throughout standing therex to encourage pt. to avoid back extension with activities.  HEP updated with additional strengthening activities.  Will continues to progress per pt. in coming visits.     PT Treatment/Interventions ADLs/Self Care Home Management;Cryotherapy;Electrical Stimulation;Moist Heat;Ultrasound;Neuromuscular re-education;Balance training;Therapeutic exercise;Therapeutic activities;Functional mobility training;Stair training;Gait training;Patient/family education;Manual techniques;Passive range of motion;Vasopneumatic Device;Taping;Dry needling   PT Next Visit Plan no bending, twisting, lifting greater than 15-20#      Patient will benefit from skilled therapeutic intervention in order to improve the following deficits and impairments:  Abnormal gait, Decreased activity tolerance, Decreased balance, Decreased range of motion, Decreased mobility, Decreased strength, Difficulty walking  Visit Diagnosis: Unsteadiness on feet  Other abnormalities of gait and mobility  Muscle weakness (generalized)  Other symptoms and signs involving the musculoskeletal system     Problem List Patient Active Problem List   Diagnosis Date Noted  . Breast cancer of upper-outer  quadrant of right female breast [facility]) 12/17/2015   Bess Harvest, PTA 04/22/17 6:16 PM  [facility] [address]  [place], [place], [address] Phone: [phone]   Fax:  [phone]  Name: Michelle Bowen MRN: [id number] Date of Birth: [birth year]

## 2017-04-23 ENCOUNTER — Ambulatory Visit (HOSPITAL_BASED_OUTPATIENT_CLINIC_OR_DEPARTMENT_OTHER): Payer: Medicare Other | Admitting: Hematology and Oncology

## 2017-04-23 ENCOUNTER — Telehealth: Payer: Self-pay

## 2017-04-23 ENCOUNTER — Other Ambulatory Visit (HOSPITAL_BASED_OUTPATIENT_CLINIC_OR_DEPARTMENT_OTHER): Payer: Medicare Other

## 2017-04-23 DIAGNOSIS — Z170 Estrogen receptor positive status [ER+]: Principal | ICD-10-CM

## 2017-04-23 DIAGNOSIS — C50411 Malignant neoplasm of upper-outer quadrant of right female breast: Secondary | ICD-10-CM | POA: Diagnosis present

## 2017-04-23 DIAGNOSIS — C773 Secondary and unspecified malignant neoplasm of axilla and upper limb lymph nodes: Secondary | ICD-10-CM | POA: Diagnosis not present

## 2017-04-23 DIAGNOSIS — C7951 Secondary malignant neoplasm of bone: Secondary | ICD-10-CM

## 2017-04-23 LAB — COMPREHENSIVE METABOLIC PANEL
ALBUMIN: 3.3 g/dL — AB (ref 3.5–5.0)
ALK PHOS: 104 U/L (ref 40–150)
ANION GAP: 8 meq/L (ref 3–11)
AST: 15 U/L (ref 5–34)
BILIRUBIN TOTAL: 0.59 mg/dL (ref 0.20–1.20)
BUN: 10 mg/dL (ref 7.0–26.0)
CO2: 28 meq/L (ref 22–29)
CREATININE: 0.8 mg/dL (ref 0.6–1.1)
Calcium: 8.7 mg/dL (ref 8.4–10.4)
Chloride: 104 mEq/L (ref 98–109)
EGFR: >60 mL/min/{1.73_m2} (ref >60)
Glucose: 81 mg/dl (ref 70–140)
Potassium: 4.2 mEq/L (ref 3.5–5.1)
Sodium: 139 mEq/L (ref 136–145)
TOTAL PROTEIN: 7.3 g/dL (ref 6.4–8.3)

## 2017-04-23 LAB — CBC WITH DIFFERENTIAL/PLATELET
BASO%: 0.2 % (ref 0.0–2.0)
Basophils Absolute: 0 10*3/uL (ref 0.0–0.1)
EOS ABS: 0 10*3/uL (ref 0.0–0.5)
EOS%: 0.9 % (ref 0.0–7.0)
HEMATOCRIT: 32.8 % — AB (ref 34.8–46.6)
HEMOGLOBIN: 10.1 g/dL — AB (ref 11.6–15.9)
LYMPH#: 0.8 10*3/uL — AB (ref 0.9–3.3)
LYMPH%: 18.5 % (ref 14.0–49.7)
MCH: 27.6 pg (ref 25.1–34.0)
MCHC: 30.8 g/dL — ABNORMAL LOW (ref 31.5–36.0)
MCV: 89.6 fL (ref 79.5–101.0)
MONO#: 0.3 10*3/uL (ref 0.1–0.9)
MONO%: 7.4 % (ref 0.0–14.0)
NEUT%: 73 % (ref 38.4–76.8)
NEUTROS ABS: 3.2 10*3/uL (ref 1.5–6.5)
PLATELETS: 270 10*3/uL (ref 145–400)
RBC: 3.66 10*6/uL — AB (ref 3.70–5.45)
RDW: 15.1 % — AB (ref 11.2–14.5)
WBC: 4.3 10*3/uL (ref 3.9–10.3)

## 2017-04-23 MED ORDER — TRIAMTERENE-HCTZ 37.5-25 MG PO TABS: 1.0000 | ORAL_TABLET | Freq: Every day | ORAL | 3 refills | Status: DC

## 2017-04-23 MED ORDER — GABAPENTIN 300 MG PO CAPS: 300.0000 mg | ORAL_CAPSULE | Freq: Every day | ORAL | Status: DC

## 2017-04-23 NOTE — Telephone Encounter (Signed)
Called [facility] scheduling, could not get through. Left VM with [facility] to schedule patient for Herceptin infusion next week. Sent LOS as well.  Cyndia Bent RN

## 2017-04-27 ENCOUNTER — Telehealth: Payer: Self-pay

## 2017-04-27 ENCOUNTER — Encounter: Payer: Self-pay | Admitting: Physical Therapy

## 2017-04-27 ENCOUNTER — Ambulatory Visit: Payer: Medicare Other | Attending: Neurological Surgery | Admitting: Physical Therapy

## 2017-04-27 DIAGNOSIS — R2689 Other abnormalities of gait and mobility: Secondary | ICD-10-CM | POA: Diagnosis present

## 2017-04-27 DIAGNOSIS — M6281 Muscle weakness (generalized): Secondary | ICD-10-CM | POA: Diagnosis present

## 2017-04-27 DIAGNOSIS — R2681 Unsteadiness on feet: Secondary | ICD-10-CM | POA: Diagnosis present

## 2017-04-27 DIAGNOSIS — R29898 Other symptoms and signs involving the musculoskeletal system: Secondary | ICD-10-CM

## 2017-04-27 DIAGNOSIS — C7951 Secondary malignant neoplasm of bone: Secondary | ICD-10-CM | POA: Insufficient documentation

## 2017-04-27 NOTE — Telephone Encounter (Signed)
Sent medical records in outgoing box to be mailed to our [facility] office. Attn: to Dr. Marin Olp.  Cyndia Bent RN

## 2017-04-27 NOTE — Assessment & Plan Note (Signed)
Metastatic breast cancer with a prior history of right lumpectomy 01/17/2016 3.8 cm 11/21 lymph nodes positive ER 100%, PR 0%, Ki-67 90%, HER-2 positive ratio 4.05, gene copy number is 7.9 Refused chemotherapy and anti-HER-2 therapy Adjuvant radiation 03/17/2016 to 05/05/2016 Metastatic disease diagnosed March 2018  Patient went to [facility] [place] and underwent 2 spine surgeries and palliative radiation and was treated with Taxotere Herceptin and Perjeta for 6 cycles and is now on Herceptin maintenance.  I recommended that she also continue letrozole along with the Herceptin.  She would like to receive her care in [facility] since it would be closer to her home.  I was able to contact Dr. Marin Olp who was gracious and accepted her into his practice.  I will forward all the documents from [facility] [place] to his office.

## 2017-04-27 NOTE — Progress Notes (Signed)
Patient Care Team: Patient, No Pcp Per as PCP - General (General Practice)  DIAGNOSIS:  Encounter Diagnoses  Name Primary?  . Bone metastases ([place])   . Malignant neoplasm of upper-outer quadrant of right breast in female, estrogen receptor positive ([place])     SUMMARY OF ONCOLOGIC HISTORY:   Breast cancer of upper-outer quadrant of right female breast ([place])   11/29/2015 Mammogram    Right breast palpable lump: 1:00: 4.4 cm mass, multiple abnormal axillary lymph nodes, left breast 7 mm mass (intraductal papilloma), 5 mm calcifications, borderline abnormal lymph nodes left axilla, T2 N1 stage IIB      12/06/2015 Initial Biopsy    Right breast biopsy 1:00: Invasive ductal carcinoma, lymph node also positive, grade 3, ER 100%, PR 0%, Ki-67 90%, HER-2 positive ratio 4.05, gene copy #7.9      01/17/2016 Surgery    Rt Lumpectomy: IDC 3.8 cm, pos Lat margin, Inf laterl marg: 1 cm IDC, focal LVI, Rt Sub areolar: IDC 0.8 cm pos ant margin, 11/21 LN, Left Lumpectomy: Papilloma      02/05/2016 Surgery    Rt Nipple Inferior: multiple foci of IDC, pos Sup and Inf margin; rt Nipple Superior: multiple foci of IDC, Pos Inf margin, T2N3      03/17/2016 - 05/05/2016 Radiation Therapy    Adj XRT (refused chemotherapy and anti-HER-2 therapy)      08/22/2016 Relapse/Recurrence    CT Abd and pelvis: T12 and L1 metastatic disease with compression fracture of L1 with bone protrusion, concern for cord impingement, small bilateral lung nodules      09/2016 Surgery    Kyphoplasty at [facility] [place]      09/25/2016 - 10/06/2016 Radiation Therapy    Palliative radiation therapy to the thoracic and lumbar spine      09/2016 -  Chemotherapy    Taxotere Herceptin and Perjeta x6 cycles followed by Herceptin maintenance (at [facility])       02/2017 Surgery    Additional spine surgery with rods and screws       CHIEF COMPLIANT: Follow-up to discuss treatment plan after coming back from [facility] [place]  INTERVAL HISTORY: Michelle Bowen is a 76 year old with above-mentioned history of metastatic breast cancer who received 6 cycles of Taxotere Herceptin and Perjeta at [facility] [place] in [place].  She was living there with her daughter.  She was placed on Herceptin maintenance.  She decided to come back and live at her own house [employer].  She wanted to resume her breast cancer care locally.  She had a prior impending cord compression which was treated with spine surgery initially with kyphoplasty and later on with spine stabilization surgery.  She is doing remarkably well without any further pain or discomfort after the second surgery.  REVIEW OF SYSTEMS:   Constitutional: Denies fevers, chills or abnormal weight loss Eyes: Denies blurriness of vision Ears, nose, mouth, throat, and face: Denies mucositis or sore throat Respiratory: Denies cough, dyspnea or wheezes Cardiovascular: Denies palpitation, chest discomfort Gastrointestinal:  Denies nausea, heartburn or change in bowel habits Skin: Denies abnormal skin rashes Lymphatics: Denies new lymphadenopathy or easy bruising Neurological:Denies numbness, tingling or new weaknesses Behavioral/Psych: Mood is stable, no new changes  Extremities: No lower extremity edema All other systems were reviewed with the patient and are negative.  I have reviewed the past medical history, past surgical history, social history and family history with the patient and they are  unchanged from previous note.  ALLERGIES:  has No Known Allergies.  MEDICATIONS:  Current Outpatient Medications  Medication Sig Dispense Refill  . cyanocobalamin 500 MCG tablet Take 500 mcg by mouth daily.    Marland Kitchen gabapentin (NEURONTIN) 300 MG capsule Take 1 capsule (300 mg total) by mouth at bedtime.    . Lactobacillus (PROBIOTIC ACIDOPHILUS PO) Take by mouth.    . letrozole (FEMARA) 2.5 MG tablet Take 1 tablet (2.5 mg total) by mouth  daily. (Patient not taking: Reported on 08/27/2016) 90 tablet 3  . oxyCODONE-acetaminophen (PERCOCET/ROXICET) 5-325 MG tablet Take 1 tablet by mouth every 8 (eight) hours as needed for severe pain. 60 tablet 0  . triamterene-hydrochlorothiazide (MAXZIDE-25) 37.5-25 MG tablet Take 1 tablet by mouth daily. 90 tablet 3   No current facility-administered medications for this visit.     PHYSICAL EXAMINATION: ECOG PERFORMANCE STATUS: 1 - Symptomatic but completely ambulatory  Vitals:   04/23/17 1351  BP: 139/73  Pulse: 81  Resp: 18  Temp: 98.7 F (37.1 C)  SpO2: 100%   Filed Weights   04/23/17 1351  Weight: 130 lb 6.4 oz (59.1 kg)    GENERAL:alert, no distress and comfortable SKIN: skin color, texture, turgor are normal, no rashes or significant lesions EYES: normal, Conjunctiva are pink and non-injected, sclera clear OROPHARYNX:no exudate, no erythema and lips, buccal mucosa, and tongue normal  NECK: supple, thyroid normal size, non-tender, without nodularity LYMPH:  no palpable lymphadenopathy in the cervical, axillary or inguinal LUNGS: clear to auscultation and percussion with normal breathing effort HEART: regular rate & rhythm and no murmurs and no lower extremity edema ABDOMEN:abdomen soft, non-tender and normal bowel sounds MUSCULOSKELETAL:no cyanosis of digits and no clubbing  NEURO: alert & oriented x 3 with fluent speech, no focal motor/sensory deficits EXTREMITIES: No lower extremity edema   LABORATORY DATA:  I have reviewed the data as listed   Chemistry      Component Value Date/Time   NA 139 04/23/2017 1256   K 4.2 04/23/2017 1256   CL 100 (L) 02/01/2016 1220   CO2 28 04/23/2017 1256   BUN 10.0 04/23/2017 1256   CREATININE 0.8 04/23/2017 1256      Component Value Date/Time   CALCIUM 8.7 04/23/2017 1256   ALKPHOS 104 04/23/2017 1256   AST 15 04/23/2017 1256   ALT <6 04/23/2017 1256   BILITOT 0.59 04/23/2017 1256       Lab Results  Component Value  Date   WBC 4.3 04/23/2017   HGB 10.1 (L) 04/23/2017   HCT 32.8 (L) 04/23/2017   MCV 89.6 04/23/2017   PLT 270 04/23/2017   NEUTROABS 3.2 04/23/2017    ASSESSMENT & PLAN:  Breast cancer of upper-outer quadrant of right female breast ([place]) Metastatic breast cancer with a prior history of right lumpectomy 01/17/2016 3.8 cm 11/21 lymph nodes positive ER 100%, PR 0%, Ki-67 90%, HER-2 positive ratio 4.05, gene copy number is 7.9 Refused chemotherapy and anti-HER-2 therapy Adjuvant radiation 03/17/2016 to 05/05/2016 Metastatic disease diagnosed March 2018  Patient went to [facility] [place] and underwent 2 spine surgeries and palliative radiation and was treated with Taxotere Herceptin and Perjeta for 6 cycles and is now on Herceptin maintenance.  I recommended that she also continue letrozole along with the Herceptin.  She would like to receive her care in [facility] since it would be closer to her home.  I was able to contact Dr. Marin Olp who was gracious and accepted her into his  practice.  I will forward all the documents from [facility] [place] to his office.   I spent 25 minutes talking to the patient of which more than half was spent in counseling and coordination of care.  No orders of the defined types were placed in this encounter.  The patient has a good understanding of the overall plan. she agrees with it. she will call with any problems that may develop before the next visit here.   Rulon Eisenmenger, MD 04/27/17

## 2017-04-27 NOTE — Therapy (Signed)
[facility] [address]  [place], [place], [address] Phone: [phone]   Fax:  [phone]  Physical Therapy Treatment  Patient Details  Name: Michelle Bowen MRN: [id number] Date of Birth: [birth year] Referring Provider: Dr. Sharyn Lull   Encounter Date: 04/27/2017  PT End of Session - 04/27/17 1455    Visit Number  3    Number of Visits  12    Date for PT Re-Evaluation  06/01/17    Authorization Type  Medicare    PT Start Time  1324    PT Stop Time  1445    PT Time Calculation (min)  49 min    Activity Tolerance  Patient tolerated treatment well    Behavior During Therapy  [facility] for tasks assessed/performed       Past Medical History:  Diagnosis Date  . Arthritis    bil knees, left thumb  . Breast cancer ([place]) 12/06/15   Right  . Hypertension     Past Surgical History:  Procedure Laterality Date  . BREAST SURGERY Right 2001   sebaceous cyst removal  . TONSILLECTOMY      There were no vitals filed for this visit.  Subjective Assessment - 04/27/17 1358    Subjective  Patient reports feeling tired today with no real pain, just feeling uncomfortable in the L side/abdomen area.     Currently in Pain?  No/denies    Multiple Pain Sites  No                      [facility] Adult PT Treatment/Exercise - 04/27/17 1401      Knee/Hip Exercises: Aerobic   Nustep  Lvl 4, 6 min       Knee/Hip Exercises: Standing   Hip Flexion  Right;Left;10 reps    Hip Flexion Limitations  holding onto chair; 2# ankle weight    Hip Abduction  Right;Left;10 reps;Knee straight    Abduction Limitations  holding onto chair; 2# ankle weight    Hip Extension  Right;Left;10 reps;Knee straight    Extension Limitations  holding onto chair; 2# ankle weight    SLS  R/L; 5 sec hold each rep; 10 reps each leg    Other Standing Knee Exercises  alt. marching onto 6" aerobic step; 1 UE support on counter; 2x 10 each leg      Knee/Hip Exercises: Seated   Long Arc Quad  Right;Left;10 reps    Long Arc Quad Limitations  2# ankle weight    Hamstring Curl  Strengthening;Right;Left;10 reps    Hamstring Limitations  red TB     Abduction/Adduction   Both;15 reps    Abd/Adduction Limitations  5" hold; pillow squeeze; red tband w/ abd.               PT Short Term Goals - 04/22/17 1425      PT SHORT TERM GOAL #1   Title  patient to be independent with initial HEP for strength and balance    Status  On-going      PT SHORT TERM GOAL #2   Title  Patient to improve TUG to 13.5 seconds demonstrating reduced fall risk    Status  On-going        PT Long Term Goals - 04/22/17 1425      PT LONG TERM GOAL #1   Title  patient to improve B LE strength to >/= 4/5 needed for improved functional mobility  Status  On-going      PT LONG TERM GOAL #2   Title  patient to improve Berg Balance score to 46/56 demonstrating reduced fall risk    Status  On-going      PT LONG TERM GOAL #3   Title  patient to demonstrate SL stance of >/= 6 seconds    Status  On-going      PT LONG TERM GOAL #4   Title  patient to ambulate on various surfaces with SPC with no evidence of LOB or instability    Status  On-going      PT LONG TERM GOAL #5   Title  Patient to initiate and maintain walking program for exercise for 3-5 days/week     Status  On-going            Plan - 04/27/17 1456    Clinical Impression Statement  Patient tolerating exercises well. Patient able to progress exercises to incorporate slightly more resistance with hip strengthening today. Session today including single leg balance activities and compliant surfaces as patient continues to report feeling unsteady on her feet. Will conitnue to progress strengthening and balance exercises as patient tolerates; will be in contact with MD to discuss timeline for how he plans to maintain lumbar restrictions.     PT Treatment/Interventions  ADLs/Self Care Home  Management;Cryotherapy;Electrical Stimulation;Moist Heat;Ultrasound;Neuromuscular re-education;Balance training;Therapeutic exercise;Therapeutic activities;Functional mobility training;Stair training;Gait training;Patient/family education;Manual techniques;Passive range of motion;Vasopneumatic Device;Taping;Dry needling       Patient will benefit from skilled therapeutic intervention in order to improve the following deficits and impairments:  Abnormal gait, Decreased activity tolerance, Decreased balance, Decreased range of motion, Decreased mobility, Decreased strength, Difficulty walking  Visit Diagnosis: Unsteadiness on feet  Other abnormalities of gait and mobility  Muscle weakness (generalized)  Other symptoms and signs involving the musculoskeletal system     Problem List Patient Active Problem List   Diagnosis Date Noted  . Bone metastases ([place]) 04/27/2017  . Breast cancer of upper-outer quadrant of right female breast [facility]) 12/17/2015     Mickle Asper, SPT 04/27/17 3:05 PM   [facility] [address]  [place], [place], [address] Phone: [phone]   Fax:  [phone]  Name: Michelle Bowen MRN: [id number] Date of Birth: July 08, 1940

## 2017-04-28 ENCOUNTER — Telehealth: Payer: Self-pay | Admitting: Hematology & Oncology

## 2017-04-28 NOTE — Telephone Encounter (Signed)
sw patient to sch lab/ov/inf appt 11/8 at 145 pm per sch msg. Pt saw Dr [name] at [facility]

## 2017-04-29 ENCOUNTER — Other Ambulatory Visit: Payer: Self-pay | Admitting: Family

## 2017-04-29 ENCOUNTER — Ambulatory Visit: Payer: Medicare Other | Admitting: Physical Therapy

## 2017-04-29 ENCOUNTER — Encounter: Payer: Self-pay | Admitting: Physical Therapy

## 2017-04-29 DIAGNOSIS — C7951 Secondary malignant neoplasm of bone: Secondary | ICD-10-CM

## 2017-04-29 DIAGNOSIS — M6281 Muscle weakness (generalized): Secondary | ICD-10-CM

## 2017-04-29 DIAGNOSIS — R2689 Other abnormalities of gait and mobility: Secondary | ICD-10-CM

## 2017-04-29 DIAGNOSIS — C50411 Malignant neoplasm of upper-outer quadrant of right female breast: Secondary | ICD-10-CM

## 2017-04-29 DIAGNOSIS — R29898 Other symptoms and signs involving the musculoskeletal system: Secondary | ICD-10-CM

## 2017-04-29 DIAGNOSIS — R2681 Unsteadiness on feet: Secondary | ICD-10-CM

## 2017-04-29 DIAGNOSIS — Z170 Estrogen receptor positive status [ER+]: Secondary | ICD-10-CM

## 2017-04-29 NOTE — Therapy (Signed)
[facility] [address]  [place], [place], [address] Phone: [phone]   Fax:  [phone]  Physical Therapy Treatment  Patient Details  Name: Michelle Bowen MRN: [id number] Date of Birth: 06/22/1941 Referring Provider: Dr. Sharyn Lull   Encounter Date: 04/29/2017  PT End of Session - 04/29/17 1451    Visit Number  4    Number of Visits  12    Date for PT Re-Evaluation  06/01/17    Authorization Type  Medicare    PT Start Time  2992    PT Stop Time  1446    PT Time Calculation (min)  42 min    Activity Tolerance  Patient tolerated treatment well    Behavior During Therapy  [facility] for tasks assessed/performed       Past Medical History:  Diagnosis Date  . Arthritis    bil knees, left thumb  . Breast cancer ([place]) 12/06/15   Right  . Hypertension     Past Surgical History:  Procedure Laterality Date  . BREAST SURGERY Right 2001   sebaceous cyst removal  . TONSILLECTOMY      There were no vitals filed for this visit.  Subjective Assessment - 04/29/17 1449    Subjective  Patient doing well today. Feels very fatigued in general and didn't sleep well last night. No reports of back pain. Says she felt good after last PT session.    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  No/denies    Multiple Pain Sites  No                      [facility] Adult PT Treatment/Exercise - 04/29/17 1405      Knee/Hip Exercises: Aerobic   Nustep  Lvl 4, 6 min       Knee/Hip Exercises: Standing   Heel Raises  Both;15 reps    Hip Flexion  Right;Left;10 reps    Hip Flexion Limitations  holding onto chair; 2# ankle weight    Hip Abduction  Right;Left;10 reps;Knee straight;2 sets    Abduction Limitations  holding onto chair; 2# ankle weight    Hip Extension  Right;Left;10 reps;Knee straight    Extension Limitations  holding onto chair; 2# ankle weight    Lateral Step Up   Right;Left;10 reps;Hand Hold: 1;Step Height: 6"    Forward Step Up  Right;Left;10 reps;Hand Hold: 1;Step Height: 6"    Other Standing Knee Exercises  side stepping with tband around ankles; R/L; yellow tband; 44ft each    Other Standing Knee Exercises  toe raises; B; 15 reps      Knee/Hip Exercises: Seated   Other Seated Knee/Hip Exercises  Fitter knee extension; R/L; 2 blue cords; 15 reps each               PT Short Term Goals - 04/22/17 1425      PT SHORT TERM GOAL #1   Title  patient to be independent with initial HEP for strength and balance    Status  On-going      PT SHORT TERM GOAL #2   Title  Patient to improve TUG to 13.5 seconds demonstrating reduced fall risk    Status  On-going        PT Long Term Goals - 04/22/17 1425      PT LONG TERM GOAL #1   Title  patient to  improve B LE strength to >/= 4/5 needed for improved functional mobility    Status  On-going      PT LONG TERM GOAL #2   Title  patient to improve Berg Balance score to 46/56 demonstrating reduced fall risk    Status  On-going      PT LONG TERM GOAL #3   Title  patient to demonstrate SL stance of >/= 6 seconds    Status  On-going      PT LONG TERM GOAL #4   Title  patient to ambulate on various surfaces with SPC with no evidence of LOB or instability    Status  On-going      PT LONG TERM GOAL #5   Title  Patient to initiate and maintain walking program for exercise for 3-5 days/week     Status  On-going            Plan - 04/29/17 1451    Clinical Impression Statement  Patient doing well with exercises. Progressed exercises slightly today, however required multiple sitting rest breaks due to patient general fatigue. Spoke to MD after last PT session who requested patient to continue with current restrictions until 3 mo. post-op. Will continue to progress as patient tolerates with seated and standing hip strengthening without incorporating core work.     PT Treatment/Interventions   ADLs/Self Care Home Management;Cryotherapy;Electrical Stimulation;Moist Heat;Ultrasound;Neuromuscular re-education;Balance training;Therapeutic exercise;Therapeutic activities;Functional mobility training;Stair training;Gait training;Patient/family education;Manual techniques;Passive range of motion;Vasopneumatic Device;Taping;Dry needling    PT Next Visit Plan  no bending, twisting, lifting greater than 15-20#       Patient will benefit from skilled therapeutic intervention in order to improve the following deficits and impairments:  Abnormal gait, Decreased activity tolerance, Decreased balance, Decreased range of motion, Decreased mobility, Decreased strength, Difficulty walking  Visit Diagnosis: Unsteadiness on feet  Other abnormalities of gait and mobility  Muscle weakness (generalized)  Other symptoms and signs involving the musculoskeletal system     Problem List Patient Active Problem List   Diagnosis Date Noted  . Bone metastases ([place]) 04/27/2017  . Breast cancer of upper-outer quadrant of right female breast [facility]) 12/17/2015     Mickle Asper, SPT 04/29/17 2:57 PM    [facility] [address]  [place], [place], [address] Phone: [phone]   Fax:  [phone]  Name: QUETZALY EBNER MRN: [id number] Date of Birth: 05/05/1941

## 2017-04-30 ENCOUNTER — Encounter: Payer: Self-pay | Admitting: Family

## 2017-04-30 ENCOUNTER — Ambulatory Visit (HOSPITAL_BASED_OUTPATIENT_CLINIC_OR_DEPARTMENT_OTHER): Payer: Medicare Other | Admitting: Family

## 2017-04-30 ENCOUNTER — Other Ambulatory Visit (HOSPITAL_BASED_OUTPATIENT_CLINIC_OR_DEPARTMENT_OTHER): Payer: Medicare Other

## 2017-04-30 ENCOUNTER — Ambulatory Visit (HOSPITAL_BASED_OUTPATIENT_CLINIC_OR_DEPARTMENT_OTHER): Payer: Medicare Other

## 2017-04-30 ENCOUNTER — Other Ambulatory Visit: Payer: Self-pay

## 2017-04-30 ENCOUNTER — Ambulatory Visit: Payer: Medicare Other

## 2017-04-30 VITALS — BP 185/80 | HR 70 | Temp 97.9°F | Resp 16 | Wt 129.0 lb

## 2017-04-30 DIAGNOSIS — C50411 Malignant neoplasm of upper-outer quadrant of right female breast: Secondary | ICD-10-CM

## 2017-04-30 DIAGNOSIS — C7951 Secondary malignant neoplasm of bone: Secondary | ICD-10-CM

## 2017-04-30 DIAGNOSIS — Z170 Estrogen receptor positive status [ER+]: Secondary | ICD-10-CM

## 2017-04-30 DIAGNOSIS — Z923 Personal history of irradiation: Secondary | ICD-10-CM | POA: Diagnosis not present

## 2017-04-30 DIAGNOSIS — Z5112 Encounter for antineoplastic immunotherapy: Secondary | ICD-10-CM | POA: Diagnosis present

## 2017-04-30 LAB — CBC WITH DIFFERENTIAL (CANCER CENTER ONLY)
BASO#: 0 10*3/uL (ref 0.0–0.2)
BASO%: 0 % (ref 0.0–2.0)
EOS ABS: 0.1 10*3/uL (ref 0.0–0.5)
EOS%: 1.8 % (ref 0.0–7.0)
HEMATOCRIT: 31.5 % — AB (ref 34.8–46.6)
HEMOGLOBIN: 9.9 g/dL — AB (ref 11.6–15.9)
LYMPH#: 0.6 10*3/uL — ABNORMAL LOW (ref 0.9–3.3)
LYMPH%: 22.6 % (ref 14.0–48.0)
MCH: 28 pg (ref 26.0–34.0)
MCHC: 31.4 g/dL — AB (ref 32.0–36.0)
MCV: 89 fL (ref 81–101)
MONO#: 0.3 10*3/uL (ref 0.1–0.9)
MONO%: 11.7 % (ref 0.0–13.0)
NEUT%: 63.9 % (ref 39.6–80.0)
NEUTROS ABS: 1.8 10*3/uL (ref 1.5–6.5)
Platelets: 266 10*3/uL (ref 145–400)
RBC: 3.53 10*6/uL — AB (ref 3.70–5.32)
RDW: 14.7 % (ref 11.1–15.7)
WBC: 2.8 10*3/uL — AB (ref 3.9–10.0)

## 2017-04-30 LAB — CMP (CANCER CENTER ONLY)
ALT(SGPT): 14 U/L (ref 10–47)
AST: 26 U/L (ref 11–38)
Albumin: 3.5 g/dL (ref 3.3–5.5)
Alkaline Phosphatase: 90 U/L — ABNORMAL HIGH (ref 26–84)
BUN, Bld: 10 mg/dL (ref 7–22)
CHLORIDE: 104 meq/L (ref 98–108)
CO2: 27 meq/L (ref 18–33)
CREATININE: 0.9 mg/dL (ref 0.6–1.2)
Calcium: 8.8 mg/dL (ref 8.0–10.3)
Glucose, Bld: 86 mg/dL (ref 73–118)
POTASSIUM: 4.1 meq/L (ref 3.3–4.7)
SODIUM: 142 meq/L (ref 128–145)
TOTAL PROTEIN: 7.7 g/dL (ref 6.4–8.1)
Total Bilirubin: 0.7 mg/dl (ref 0.20–1.60)

## 2017-04-30 MED ORDER — TRASTUZUMAB CHEMO 150 MG IV SOLR
6.0000 mg/kg | Freq: Once | INTRAVENOUS | Status: AC
  Administered 2017-04-30: 357 mg via INTRAVENOUS
  Filled 2017-04-30: qty 17

## 2017-04-30 MED ORDER — DIPHENHYDRAMINE HCL 25 MG PO CAPS
ORAL_CAPSULE | ORAL | Status: AC
  Filled 2017-04-30: qty 2

## 2017-04-30 MED ORDER — SODIUM CHLORIDE 0.9 % IV SOLN
20.0000 mg | Freq: Once | INTRAVENOUS | Status: AC
  Administered 2017-04-30: 20 mg via INTRAVENOUS
  Filled 2017-04-30: qty 2

## 2017-04-30 MED ORDER — HEPARIN SOD (PORK) LOCK FLUSH 100 UNIT/ML IV SOLN
500.0000 [IU] | Freq: Once | INTRAVENOUS | Status: DC | PRN
  Filled 2017-04-30: qty 5

## 2017-04-30 MED ORDER — DIPHENHYDRAMINE HCL 25 MG PO CAPS: 50.0000 mg | ORAL_CAPSULE | Freq: Once | ORAL | Status: DC

## 2017-04-30 MED ORDER — SODIUM CHLORIDE 0.9% FLUSH
10.0000 mL | INTRAVENOUS | Status: DC | PRN
  Filled 2017-04-30: qty 10

## 2017-04-30 MED ORDER — SODIUM CHLORIDE 0.9 % IV SOLN
Freq: Once | INTRAVENOUS | Status: AC
  Administered 2017-04-30: 16:00:00 via INTRAVENOUS

## 2017-04-30 MED ORDER — DIPHENHYDRAMINE HCL 25 MG PO CAPS
50.0000 mg | ORAL_CAPSULE | Freq: Once | ORAL | Status: AC
  Administered 2017-04-30: 50 mg via ORAL

## 2017-04-30 MED ORDER — ACETAMINOPHEN 325 MG PO TABS: 650.0000 mg | ORAL_TABLET | Freq: Once | ORAL | Status: DC

## 2017-04-30 MED ORDER — SODIUM CHLORIDE 0.9% FLUSH
10.0000 mL | INTRAVENOUS | Status: DC | PRN
  Administered 2017-04-30: 10 mL
  Filled 2017-04-30: qty 10

## 2017-04-30 MED ORDER — SODIUM CHLORIDE 0.9 % IV SOLN: Freq: Once | INTRAVENOUS | Status: DC

## 2017-04-30 MED ORDER — SODIUM CHLORIDE 0.9 % IV SOLN
8.0000 mg/kg | Freq: Once | INTRAVENOUS | Status: DC
Start: 2017-04-30 — End: 2017-04-30

## 2017-04-30 MED ORDER — HEPARIN SOD (PORK) LOCK FLUSH 100 UNIT/ML IV SOLN
500.0000 [IU] | Freq: Once | INTRAVENOUS | Status: AC | PRN
  Administered 2017-04-30: 500 [IU]
  Filled 2017-04-30: qty 5

## 2017-04-30 NOTE — Progress Notes (Signed)
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 She had a right lumpectomy at that time with 11/21 lymph nodes positive. She was ER/HER2 positive and refused chemo/anti-HER2 therapy at that time. She completed adjuvant radiation (28 fractions) in November 2017. She was diagnosed with metastatic disease of the bone in March 2018. She then went to [place] and underwent 2 spinal surgeries with palliative radiation (8 fractions). She was then treated with 6 cycles of Taxotere/Herceptin/Perjeta. She is now on maintenance Herceptin as well as Femara.  She did lose her hair and have a change with her nail beds with Taxotere.  CA 27.29 was 23 in October.  She is doing quite well and has no complaints at this time. She denies fatigue.  She verbalized that she is taking her Femara as prescribed and also vitamin D daily.  Her ECHO on October 10th showed and EF of 55-60%.  She had a vertebroplasty (at L1) and sacroplasty in September. We will try and obtain these records from [facility] in [place]. She is wearing a back brace for support until December. She denies pain at this time.    Both her sister and 2 nieces have also had breast cancer.  No issue with infections. No fever, chills, n/v, cough, rash, dizziness, SOB, chest pain, palpitations, abdominal pain or changes in bowel or bladder habits.  She has some dry skin across  her abdomen and will try using a moisturizer to help with this.  No swelling or tenderness in her extremities at this time. 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  She has had no episodes of bleeding, bruising or petechiae. She has never received IV iron or blood.  She is a for 1 ppw smoker (6 years) and quit in 1970. She does not drink alcoholic beverages.  She has a good appetite and is on a strict plant based diet. She does not eat any meat. She is hydrating well and her weight is stable.   ROS: All other 10 point review of systems is negative.   PAST MEDICAL HISTORY:   Past Medical History:  Diagnosis Date  . Arthritis    bil knees, left thumb  . Breast cancer ([place]) 12/06/15   Right  . Hypertension     ALLERGIES: No Known Allergies    MEDICATIONS:  Current Outpatient Medications on File Prior to Visit  Medication Sig Dispense Refill  . cyanocobalamin 500 MCG tablet Take 500 mcg by mouth daily.    Marland Kitchen gabapentin (NEURONTIN) 300 MG capsule Take 1 capsule (300 mg total) by mouth at bedtime.    . Lactobacillus (PROBIOTIC ACIDOPHILUS PO) Take by mouth.    . letrozole (FEMARA) 2.5 MG tablet Take 1 tablet (2.5 mg total) by mouth daily. (Patient not taking: Reported on 08/27/2016) 90 tablet 3  . oxyCODONE-acetaminophen (PERCOCET/ROXICET) 5-325 MG tablet Take 1 tablet by mouth every  8 (eight) hours as needed for severe pain. 60 tablet 0  . triamterene-hydrochlorothiazide (MAXZIDE-25) 37.5-25 MG tablet Take 1 tablet by mouth daily. 90 tablet 3   No current facility-administered medications on file prior to visit.      PAST SURGICAL HISTORY Past Surgical History:  Procedure Laterality Date  . BREAST SURGERY Right 2001   sebaceous cyst removal  . TONSILLECTOMY      FAMILY HISTORY: Family History  Problem Relation Age of Onset  . Breast cancer Sister 30       breast  . Breast cancer Unknown        neice at 32  . Breast cancer Unknown        neice at  50    SOCIAL HISTORY:  reports that she has quit smoking. she has never used smokeless tobacco. She reports that she drinks alcohol. She reports that she does not use drugs.  PERFORMANCE STATUS: The patient's performance status is 1 - Symptomatic but completely ambulatory  PHYSICAL EXAM: Most Recent Vital Signs: Last menstrual period 12/26/2015. LMP 12/26/2015   General Appearance:    Alert, cooperative, no distress, appears stated age  Head:    Normocephalic, without obvious abnormality, atraumatic  Eyes:    PERRL, conjunctiva/corneas clear, EOM's intact, fundi    benign, both eyes        Throat:   Lips, mucosa, and tongue normal; teeth and gums normal  Neck:   Supple, symmetrical, trachea midline, no adenopathy;    thyroid:  no enlargement/tenderness/nodules; no carotid   bruit or JVD  Back:     Symmetric, no curvature, ROM normal, no CVA tenderness  Lungs:     Clear to auscultation bilaterally, respirations unlabored  Chest Wall:    No tenderness or deformity   Heart:    Regular rate and rhythm, S1 and S2 normal, no murmur, rub   or gallop  Breast Exam:    No tenderness, masses, or nipple abnormality  Abdomen:     Soft, non-tender, bowel sounds active all four quadrants,    no masses, no organomegaly        Extremities:   Extremities normal, atraumatic, no cyanosis or edema  Pulses:   2+ and symmetric all extremities  Skin:   Skin color, texture, turgor normal, no rashes or lesions  Lymph nodes:   Cervical, supraclavicular, and axillary nodes normal  Neurologic:   CNII-XII intact, normal strength, sensation and reflexes    throughout    LABORATORY DATA:  No results found for this or any previous visit (from the past 48 hour(s)).    RADIOGRAPHY: No results found.     PATHOLOGY:  None  ASSESSMENT/PLAN: Ms. Towner is a very pleasant 76 yo African American female with now metastatic breast cancer of the right breast, ER/HER2 positive with mets to the bone. She had her  lumpectomy in September of 2017 after her original diagnosis of breast cancer. She refused chemo/anti-HER2 therapy at that time but did have 28 fractions af radiation to the effected breast. When she was diagnosed with metastatic disease in March of 2018 she then moved to [facility] to be with her son and received treatment with the [facility] (spinal surgeries, palliative radiation to the spine and 6 cycles of Taxotere/Herceptin/Perjeta). She has now moved back home and will be receiving maintenance therapy with [place].  She is doing well on Herceptin and tolerating Femara nicely.  She will follow-up with her surgeon in [place] again in  December and hopefully at that time will be released from wearing the back brace.  We will proceed with Herceptin only today. Dr. Marin Olp spoke with her and will be adding Perjeta as well as Zometa to her regimen to start with her next infusion.  She has her current treatment and appointment schedule and we will plan to see her back later this month on 11/29.  All questions were answered and she is in agreement with the plan. She will contact our office with any questions or concerns. We can certainly see her sooner if need be.  She was discussed with and also seen by Dr. Marin Olp and he is in agreement with the aforementioned.   [facility]    Addendum: I saw and examined the patient with Jabari Swoveland.  I agree with the above assessment.  She is in very good shape.  I really believe that we should continue to be aggressive with her.  Given that she does have metastatic disease, I would consider Herceptin/Perjeta combination.  I think this would be very reasonable and very well tolerated.  Thankfully, we do have several options to think about with respect to treatment for her if she progresses.  She is very nice.  We spent a good 45 minutes with her.  We answered all of her questions.  We reassured her and gave her some peace that we will be able  to take very good care of her.  She obviously had very good care down in [place] at the [facility].  She is wearing the back brace.  It would be nice to try to track down the operative report for her back.  I am not sure she got radiation for that.  I would assume that she has.  If she does have evidence of disease progression, I probably would consider Faslodex as the next line of hormonal therapy for her.  We will plan to see her back in 3 weeks when she has her next cycle of treatment.  We will also see about Xgeva for her.  I think we can do Xgeva every 3 months.  [name], MD

## 2017-04-30 NOTE — Patient Instructions (Signed)
Trastuzumab injection for infusion What is this medicine? TRASTUZUMAB (tras TOO zoo mab) is a monoclonal antibody. It is used to treat breast cancer and stomach cancer. This medicine may be used for other purposes; ask your health care provider or pharmacist if you have questions. COMMON BRAND NAME(S): Herceptin What should I tell my health care provider before I take this medicine? They need to know if you have any of these conditions: -heart disease -heart failure -lung or breathing disease, like asthma -an unusual or allergic reaction to trastuzumab, benzyl alcohol, or other medications, foods, dyes, or preservatives -pregnant or trying to get pregnant -breast-feeding How should I use this medicine? This drug is given as an infusion into a vein. It is administered in a hospital or clinic by a specially trained health care professional. Talk to your pediatrician regarding the use of this medicine in children. This medicine is not approved for use in children. Overdosage: If you think you have taken too much of this medicine contact a poison control center or emergency room at once. NOTE: This medicine is only for you. Do not share this medicine with others. What if I miss a dose? It is important not to miss a dose. Call your doctor or health care professional if you are unable to keep an appointment. What may interact with this medicine? This medicine may interact with the following medications: -certain types of chemotherapy, such as daunorubicin, doxorubicin, epirubicin, and idarubicin This list may not describe all possible interactions. Give your health care provider a list of all the medicines, herbs, non-prescription drugs, or dietary supplements you use. Also tell them if you smoke, drink alcohol, or use illegal drugs. Some items may interact with your medicine. What should I watch for while using this medicine? Visit your doctor for checks on your progress. Report any side effects.  Continue your course of treatment even though you feel ill unless your doctor tells you to stop. Call your doctor or health care professional for advice if you get a fever, chills or sore throat, or other symptoms of a cold or flu. Do not treat yourself. Try to avoid being around people who are sick. You may experience fever, chills and shaking during your first infusion. These effects are usually mild and can be treated with other medicines. Report any side effects during the infusion to your health care professional. Fever and chills usually do not happen with later infusions. Do not become pregnant while taking this medicine or for 7 months after stopping it. Women should inform their doctor if they wish to become pregnant or think they might be pregnant. Women of child-bearing potential will need to have a negative pregnancy test before starting this medicine. There is a potential for serious side effects to an unborn child. Talk to your health care professional or pharmacist for more information. Do not breast-feed an infant while taking this medicine or for 7 months after stopping it. Women must use effective birth control with this medicine. What side effects may I notice from receiving this medicine? Side effects that you should report to your doctor or health care professional as soon as possible: -allergic reactions like skin rash, itching or hives, swelling of the face, lips, or tongue -chest pain or palpitations -cough -dizziness -feeling faint or lightheaded, falls -fever -general ill feeling or flu-like symptoms -signs of worsening heart failure like breathing problems; swelling in your legs and feet -unusually weak or tired Side effects that usually do not require medical   attention (report to your doctor or health care professional if they continue or are bothersome): -bone pain -changes in taste -diarrhea -joint pain -nausea/vomiting -weight loss This list may not describe all  possible side effects. Call your doctor for medical advice about side effects. You may report side effects to FDA at 1-800-FDA-1088. Where should I keep my medicine? This drug is given in a hospital or clinic and will not be stored at home. NOTE: This sheet is a summary. It may not cover all possible information. If you have questions about this medicine, talk to your doctor, pharmacist, or health care provider.  2018 Elsevier/Gold Standard (2016-06-03 14:37:52)  

## 2017-05-01 LAB — CANCER ANTIGEN 27.29: CAN 27.29: 22.8 U/mL (ref 0.0–38.6)

## 2017-05-04 ENCOUNTER — Ambulatory Visit: Payer: Medicare Other | Admitting: Physical Therapy

## 2017-05-04 ENCOUNTER — Encounter: Payer: Self-pay | Admitting: Physical Therapy

## 2017-05-04 DIAGNOSIS — M6281 Muscle weakness (generalized): Secondary | ICD-10-CM

## 2017-05-04 DIAGNOSIS — R29898 Other symptoms and signs involving the musculoskeletal system: Secondary | ICD-10-CM

## 2017-05-04 DIAGNOSIS — R2681 Unsteadiness on feet: Secondary | ICD-10-CM

## 2017-05-04 DIAGNOSIS — R2689 Other abnormalities of gait and mobility: Secondary | ICD-10-CM

## 2017-05-04 NOTE — Therapy (Signed)
[facility] [address]  [place], [place], [address] Phone: [phone]   Fax:  [phone]  Physical Therapy Treatment  Patient Details  Name: Michelle Bowen MRN: [id number] Date of Birth: [birth year] Referring Provider: Dr. Sharyn Lull   Encounter Date: 05/04/2017  PT End of Session - 05/04/17 1504    Visit Number  5    Number of Visits  12    Date for PT Re-Evaluation  06/01/17    Authorization Type  Medicare    PT Start Time  6948    PT Stop Time  1447    PT Time Calculation (min)  45 min    Activity Tolerance  Patient tolerated treatment well    Behavior During Therapy  [facility] for tasks assessed/performed       Past Medical History:  Diagnosis Date  . Arthritis    bil knees, left thumb  . Breast cancer ([place]) 12/06/15   Right  . Hypertension     Past Surgical History:  Procedure Laterality Date  . BREAST SURGERY Right 2001   sebaceous cyst removal  . TONSILLECTOMY      There were no vitals filed for this visit.  Subjective Assessment - 05/04/17 1406    Subjective  Patient reporting no pain and feeling much better over the weekend.     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  No/denies    Pain Score  0-No pain    Multiple Pain Sites  No                      [facility] Adult PT Treatment/Exercise - 05/04/17 1408      Ambulation/Gait   Gait Comments  walking with vertical/horizontal head turns; speed changes; 172ft x 2; no assistive device - some deviations in path and speed during gaze changes      Knee/Hip Exercises: Aerobic   Nustep  Lvl 4, 6 min       Knee/Hip Exercises: Standing   Hip Flexion  Right;Left;10 reps;Knee straight    Hip Flexion Limitations  red tband - 2 pole assist    Hip ADduction  Right;Left;10 reps    Hip ADduction Limitations  red tband - 2 pole assist    Hip Abduction  Right;Left;10 reps;Knee straight    Abduction Limitations  red tband - 2 pole assist    Hip Extension  Right;Left;10 reps;Knee straight    Extension Limitations  red tband - 2 pole assist    Step Down  Right;Left;10 reps;Hand Hold: 2;Step Height: 4"               PT Short Term Goals - 04/22/17 1425      PT SHORT TERM GOAL #1   Title  patient to be independent with initial HEP for strength and balance    Status  On-going      PT SHORT TERM GOAL #2   Title  Patient to improve TUG to 13.5 seconds demonstrating reduced fall risk    Status  On-going        PT Long Term Goals - 04/22/17 1425      PT LONG TERM GOAL #1   Title  patient to improve B LE strength to >/= 4/5 needed for improved functional mobility    Status  On-going      PT LONG TERM GOAL #2   Title  patient  to improve Berg Balance score to 46/56 demonstrating reduced fall risk    Status  On-going      PT LONG TERM GOAL #3   Title  patient to demonstrate SL stance of >/= 6 seconds    Status  On-going      PT LONG TERM GOAL #4   Title  patient to ambulate on various surfaces with SPC with no evidence of LOB or instability    Status  On-going      PT LONG TERM GOAL #5   Title  Patient to initiate and maintain walking program for exercise for 3-5 days/week     Status  On-going            Plan - 05/04/17 1506    Clinical Impression Statement  Patient tolerating exercises well. Incorproated walking without rollator into treatment today with patient doing well, however patient stating she feels as though she is not walking normally. Some cueing necessary for patient to maintain upright posture and forward gaze with ambulation and exercises in standing.     PT Treatment/Interventions  ADLs/Self Care Home Management;Cryotherapy;Electrical Stimulation;Moist Heat;Ultrasound;Neuromuscular re-education;Balance training;Therapeutic exercise;Therapeutic activities;Functional mobility training;Stair training;Gait training;Patient/family  education;Manual techniques;Passive range of motion;Vasopneumatic Device;Taping;Dry needling    PT Next Visit Plan  no bending, twisting, lifting greater than 15-20#       Patient will benefit from skilled therapeutic intervention in order to improve the following deficits and impairments:  Abnormal gait, Decreased activity tolerance, Decreased balance, Decreased range of motion, Decreased mobility, Decreased strength, Difficulty walking  Visit Diagnosis: Unsteadiness on feet  Other abnormalities of gait and mobility  Muscle weakness (generalized)  Other symptoms and signs involving the musculoskeletal system     Problem List Patient Active Problem List   Diagnosis Date Noted  . Bone metastases ([place]) 04/27/2017  . Breast cancer of upper-outer quadrant of right female breast [facility]) 12/17/2015     Mickle Asper, SPT 05/04/17 4:48 PM    [facility] [address]  [place], [place], [address] Phone: [phone]   Fax:  [phone]  Name: CECILA SATCHER MRN: [id number] Date of Birth: 05/16/1941

## 2017-05-06 ENCOUNTER — Encounter: Payer: Self-pay | Admitting: Physical Therapy

## 2017-05-06 ENCOUNTER — Ambulatory Visit: Payer: Medicare Other | Admitting: Physical Therapy

## 2017-05-06 DIAGNOSIS — R29898 Other symptoms and signs involving the musculoskeletal system: Secondary | ICD-10-CM

## 2017-05-06 DIAGNOSIS — R2681 Unsteadiness on feet: Secondary | ICD-10-CM | POA: Diagnosis not present

## 2017-05-06 DIAGNOSIS — R2689 Other abnormalities of gait and mobility: Secondary | ICD-10-CM

## 2017-05-06 DIAGNOSIS — M6281 Muscle weakness (generalized): Secondary | ICD-10-CM

## 2017-05-06 NOTE — Therapy (Signed)
[facility] [address]  [place], [place], [address] Phone: [phone]   Fax:  [phone]  Physical Therapy Treatment  Patient Details  Name: Michelle Bowen MRN: [id number] Date of Birth: 11/15/1940 Referring Provider: Dr. Sharyn Lull   Encounter Date: 05/06/2017  PT End of Session - 05/06/17 1457    Visit Number  6    Number of Visits  12    Date for PT Re-Evaluation  06/01/17    Authorization Type  Medicare    PT Start Time  6237    PT Stop Time  1446    PT Time Calculation (min)  42 min    Activity Tolerance  Patient tolerated treatment well    Behavior During Therapy  [facility] for tasks assessed/performed       Past Medical History:  Diagnosis Date  . Arthritis    bil knees, left thumb  . Breast cancer ([place]) 12/06/15   Right  . Hypertension     Past Surgical History:  Procedure Laterality Date  . BREAST SURGERY Right 2001   sebaceous cyst removal  . TONSILLECTOMY      There were no vitals filed for this visit.  Subjective Assessment - 05/06/17 1406    Subjective  Patient feeling much better in terms of fatigue today, however reporting some discomfort in L side near incision site.     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  Yes    Pain Score  3     Pain Location  Back    Pain Orientation  Left;Lower    Pain Descriptors / Indicators  Discomfort    Pain Type  Acute pain                      [facility] Adult PT Treatment/Exercise - 05/06/17 1348      Knee/Hip Exercises: Aerobic   Nustep  Lvl 4, 6 min       Knee/Hip Exercises: Standing   Other Standing Knee Exercises  side stepping; R/L; red tband around ankles; 61ft each way    Other Standing Knee Exercises  forward/backward/lateral steps onto and over foam pad; 15 reps each leg each direction; 2 hand hold support      Knee/Hip Exercises: Seated   Other Seated Knee/Hip Exercises   Fitter knee extension; R/L; 1 blue 1 black; 15 reps each    Hamstring Curl  Right;Left;15 reps    Hamstring Limitations  red TB                PT Short Term Goals - 04/22/17 1425      PT SHORT TERM GOAL #1   Title  patient to be independent with initial HEP for strength and balance    Status  On-going      PT SHORT TERM GOAL #2   Title  Patient to improve TUG to 13.5 seconds demonstrating reduced fall risk    Status  On-going        PT Long Term Goals - 04/22/17 1425      PT LONG TERM GOAL #1   Title  patient to improve B LE strength to >/= 4/5 needed for improved functional mobility    Status  On-going      PT LONG TERM GOAL #2   Title  patient to improve Berg Balance score to 46/56 demonstrating reduced fall risk  Status  On-going      PT LONG TERM GOAL #3   Title  patient to demonstrate SL stance of >/= 6 seconds    Status  On-going      PT LONG TERM GOAL #4   Title  patient to ambulate on various surfaces with SPC with no evidence of LOB or instability    Status  On-going      PT LONG TERM GOAL #5   Title  Patient to initiate and maintain walking program for exercise for 3-5 days/week     Status  On-going            Plan - 05/06/17 1457    Clinical Impression Statement  Patient doing well with balance and strengthening exercises today. Able to progress reps/resistances for all exercises. Patient still lacking confidence in most activities in standing, however patient demonstrating notable improvements in balance. Patient required much less cueing for posture today.    PT Treatment/Interventions  ADLs/Self Care Home Management;Cryotherapy;Electrical Stimulation;Moist Heat;Ultrasound;Neuromuscular re-education;Balance training;Therapeutic exercise;Therapeutic activities;Functional mobility training;Stair training;Gait training;Patient/family education;Manual techniques;Passive range of motion;Vasopneumatic Device;Taping;Dry needling    PT Next Visit Plan   no bending, twisting, lifting greater than 15-20#       Patient will benefit from skilled therapeutic intervention in order to improve the following deficits and impairments:  Abnormal gait, Decreased activity tolerance, Decreased balance, Decreased range of motion, Decreased mobility, Decreased strength, Difficulty walking  Visit Diagnosis: Unsteadiness on feet  Other abnormalities of gait and mobility  Muscle weakness (generalized)  Other symptoms and signs involving the musculoskeletal system     Problem List Patient Active Problem List   Diagnosis Date Noted  . Bone metastases ([place]) 04/27/2017  . Breast cancer of upper-outer quadrant of right female breast [facility]) 12/17/2015     Mickle Asper, SPT 05/06/17 3:02 PM    [facility] [address]  [place], [place], [address] Phone: [phone]   Fax:  [phone]  Name: LADINA SHUTTERS MRN: [id number] Date of Birth: [birth year]

## 2017-05-11 ENCOUNTER — Encounter: Payer: Self-pay | Admitting: Physical Therapy

## 2017-05-11 ENCOUNTER — Ambulatory Visit: Payer: Medicare Other | Admitting: Physical Therapy

## 2017-05-11 DIAGNOSIS — R2681 Unsteadiness on feet: Secondary | ICD-10-CM

## 2017-05-11 DIAGNOSIS — M6281 Muscle weakness (generalized): Secondary | ICD-10-CM

## 2017-05-11 DIAGNOSIS — R2689 Other abnormalities of gait and mobility: Secondary | ICD-10-CM

## 2017-05-11 DIAGNOSIS — R29898 Other symptoms and signs involving the musculoskeletal system: Secondary | ICD-10-CM

## 2017-05-11 NOTE — Therapy (Signed)
[facility] [address]  [place], [place], [address] Phone: [phone]   Fax:  [phone]  Physical Therapy Treatment  Patient Details  Name: Michelle Bowen MRN: [id number] Date of Birth: 13-Oct-1940 Referring Provider: Dr. Sharyn Lull   Encounter Date: 05/11/2017  PT End of Session - 05/11/17 1609    Visit Number  7    Number of Visits  12    Date for PT Re-Evaluation  06/01/17    Authorization Type  Medicare    PT Start Time  9937    PT Stop Time  1446    PT Time Calculation (min)  42 min    Activity Tolerance  Patient tolerated treatment well    Behavior During Therapy  [facility] for tasks assessed/performed       Past Medical History:  Diagnosis Date  . Arthritis    bil knees, left thumb  . Breast cancer ([place]) 12/06/15   Right  . Hypertension     Past Surgical History:  Procedure Laterality Date  . BREAST SURGERY Right 2001   sebaceous cyst removal  . RADIOACTIVE SEED GUIDED EXCISIONAL LEFT BREAST BIOPSY Left 01/17/2016   Performed by [name], MD at [facility]  . RE-EXCISION OF BREAST LUMPECTOMY AND REMOVAL OF NIPPLE Right 02/05/2016   Performed by [name], MD at [facility]  . RIGHT BREAST LUMPECTOMY WITH AXILLARY LYMPH NODE DISSECTION Right 01/17/2016   Performed by [name], MD at [facility]  . TONSILLECTOMY      There were no vitals filed for this visit.  Subjective Assessment - 05/11/17 1406    Subjective  Patient feeling some discomfort in L abdomen yesterday and today that required taking pain pill. Patient reporting moderate fatigue as she was unable to sleep well over the weekend.    Currently in Pain?  Yes    Pain Score  1     Pain Location  Back    Pain Orientation  Left;Lower    Pain Descriptors / Indicators  Discomfort    Pain Type  Acute pain                      [facility] Adult PT Treatment/Exercise - 05/11/17  1424      Ambulation/Gait   Gait Comments  ambulation with no assistive device; 132ft x 2; cues for upright posutre and forward gaze      Knee/Hip Exercises: Aerobic   Nustep  Lvl 4, 6 min       Knee/Hip Exercises: Standing   Lateral Step Up  Right;Left;10 reps;Hand Hold: 1;Step Height: 6" 2# on each ankle    Forward Step Up  Right;Left;10 reps;Hand Hold: 1;Step Height: 6" 2# on each ankle    SLS with Vectors  tap pebbles; R/L; 10x each leg; B UE support      Knee/Hip Exercises: Seated   Sit to Sand  10 reps;without UE support on airex; yellow tband at knees               PT Short Term Goals - 04/22/17 1425      PT SHORT TERM GOAL #1   Title  patient to be independent with initial HEP for strength and balance    Status  On-going      PT SHORT TERM GOAL #2   Title  Patient to improve TUG to 13.5 seconds demonstrating reduced fall risk  Status  On-going        PT Long Term Goals - 04/22/17 1425      PT LONG TERM GOAL #1   Title  patient to improve B LE strength to >/= 4/5 needed for improved functional mobility    Status  On-going      PT LONG TERM GOAL #2   Title  patient to improve Berg Balance score to 46/56 demonstrating reduced fall risk    Status  On-going      PT LONG TERM GOAL #3   Title  patient to demonstrate SL stance of >/= 6 seconds    Status  On-going      PT LONG TERM GOAL #4   Title  patient to ambulate on various surfaces with SPC with no evidence of LOB or instability    Status  On-going      PT LONG TERM GOAL #5   Title  Patient to initiate and maintain walking program for exercise for 3-5 days/week     Status  On-going            Plan - 05/11/17 1611    Clinical Impression Statement  Patient continues to demonstrate decreased endurance in B LE and reports she is most limited in prolonged standing, however patient starting to progress with confidence in balance activities. Will continue to progress strength as patient tolerates.      PT Treatment/Interventions  ADLs/Self Care Home Management;Cryotherapy;Electrical Stimulation;Moist Heat;Ultrasound;Neuromuscular re-education;Balance training;Therapeutic exercise;Therapeutic activities;Functional mobility training;Stair training;Gait training;Patient/family education;Manual techniques;Passive range of motion;Vasopneumatic Device;Taping;Dry needling    PT Next Visit Plan  no bending, twisting, lifting greater than 15-20#       Patient will benefit from skilled therapeutic intervention in order to improve the following deficits and impairments:  Abnormal gait, Decreased activity tolerance, Decreased balance, Decreased range of motion, Decreased mobility, Decreased strength, Difficulty walking  Visit Diagnosis: Unsteadiness on feet  Other abnormalities of gait and mobility  Muscle weakness (generalized)  Other symptoms and signs involving the musculoskeletal system     Problem List Patient Active Problem List   Diagnosis Date Noted  . Bone metastases ([place]) 04/27/2017  . Breast cancer of upper-outer quadrant of right female breast [facility]) 12/17/2015     Mickle Asper, SPT 05/11/17 [facility] [address]  [place], [place], [address] Phone: [phone]   Fax:  [phone]  Name: Michelle Bowen MRN: [id number] Date of Birth: [birth year]

## 2017-05-13 ENCOUNTER — Ambulatory Visit: Payer: Medicare Other | Admitting: Physical Therapy

## 2017-05-13 ENCOUNTER — Encounter: Payer: Self-pay | Admitting: Physical Therapy

## 2017-05-13 DIAGNOSIS — M6281 Muscle weakness (generalized): Secondary | ICD-10-CM

## 2017-05-13 DIAGNOSIS — R29898 Other symptoms and signs involving the musculoskeletal system: Secondary | ICD-10-CM

## 2017-05-13 DIAGNOSIS — R2681 Unsteadiness on feet: Secondary | ICD-10-CM

## 2017-05-13 DIAGNOSIS — R2689 Other abnormalities of gait and mobility: Secondary | ICD-10-CM

## 2017-05-13 NOTE — Patient Instructions (Signed)
Balance: Eyes Open - Unilateral (Varied Surfaces)     Stand on left foot, eyes open. Maintain balance _10___ seconds. Repeat __2__ times per set.      FUNCTIONAL MOBILITY: Squat With UE Support    Stand by chair or table. Stance: shoulder-width on floor. Bend hips and knees. Keep back straight. Do not allow knees to bend past toes. Squeeze glutes and quads to stand. _10__ reps per set, _2__ sets per day.   Heel Raises    Stand with support. Tighten pelvic floor and hold. With knees straight, raise heels off ground. Hold _2__ seconds. Repeat _10__ times. Do __2_ times a day.

## 2017-05-13 NOTE — Therapy (Signed)
[facility] [address]  [place], [place], [address] Phone: [phone]   Fax:  [phone]  Physical Therapy Treatment  Patient Details  Name: Michelle Bowen MRN: [id number] Date of Birth: [birth year] Referring Provider: Dr. Sharyn Lull   Encounter Date: 05/13/2017  PT End of Session - 05/13/17 1459    Visit Number  8    Number of Visits  12    Date for PT Re-Evaluation  06/01/17    Authorization Type  Medicare    PT Start Time  6387    PT Stop Time  1448    PT Time Calculation (min)  43 min    Activity Tolerance  Patient tolerated treatment well    Behavior During Therapy  [facility] for tasks assessed/performed       Past Medical History:  Diagnosis Date  . Arthritis    bil knees, left thumb  . Breast cancer ([place]) 12/06/15   Right  . Hypertension     Past Surgical History:  Procedure Laterality Date  . BREAST LUMPECTOMY WITH AXILLARY LYMPH NODE DISSECTION Right 01/17/2016   Procedure: RIGHT BREAST LUMPECTOMY WITH AXILLARY LYMPH NODE DISSECTION;  Surgeon: [name], MD;  Location: [facility];  Service: General;  Laterality: Right;  . BREAST SURGERY Right 2001   sebaceous cyst removal  . RADIOACTIVE SEED GUIDED EXCISIONAL BREAST BIOPSY Left 01/17/2016   Procedure: RADIOACTIVE SEED GUIDED EXCISIONAL LEFT BREAST BIOPSY;  Surgeon: [name], MD;  Location: [facility];  Service: General;  Laterality: Left;  . RE-EXCISION OF BREAST LUMPECTOMY Right 02/05/2016   Procedure: RE-EXCISION OF BREAST LUMPECTOMY AND REMOVAL OF NIPPLE;  Surgeon: [name], MD;  Location: [facility] [facility];  Service: General;  Laterality: Right;  . TONSILLECTOMY      There were no vitals filed for this visit.  Subjective Assessment - 05/13/17 1407    Subjective  Patient doing well today with no new complaints.     Currently in Pain?  No/denies    Pain Score  0-No pain                       [facility] Adult PT Treatment/Exercise - 05/13/17 1411      High Level Balance   High Level Balance Activities  Side stepping;Marching forwards    High Level Balance Comments  ladder; 1 LE per box; 2x forward; 1x each way lateral      Knee/Hip Exercises: Aerobic   Nustep  Lvl 4, 6 min       Knee/Hip Exercises: Standing   Heel Raises  Both;15 reps eccentric lower; UE support    Functional Squat  15 reps UE support at counter; lower to pt tolerance    SLS  R/L; slow high knee march on airex pad; 10 reps each leg required heavy PT guarding and assist    Other Standing Knee Exercises  feet together/semi tandem R/L; on airex; UE reaching for colored spots on wall pt unsteady, min assist from PT    Other Standing Knee Exercises  side step on airex balance beam tip cones over and sit back up; 1x each way               PT Short Term Goals - 04/22/17 1425      PT SHORT TERM GOAL #1   Title  patient to be independent with initial HEP for strength and balance  Status  On-going      PT SHORT TERM GOAL #2   Title  Patient to improve TUG to 13.5 seconds demonstrating reduced fall risk    Status  On-going        PT Long Term Goals - 04/22/17 1425      PT LONG TERM GOAL #1   Title  patient to improve B LE strength to >/= 4/5 needed for improved functional mobility    Status  On-going      PT LONG TERM GOAL #2   Title  patient to improve Berg Balance score to 46/56 demonstrating reduced fall risk    Status  On-going      PT LONG TERM GOAL #3   Title  patient to demonstrate SL stance of >/= 6 seconds    Status  On-going      PT LONG TERM GOAL #4   Title  patient to ambulate on various surfaces with SPC with no evidence of LOB or instability    Status  On-going      PT LONG TERM GOAL #5   Title  Patient to initiate and maintain walking program for exercise for 3-5 days/week     Status  On-going            Plan - 05/13/17 1500     Clinical Impression Statement  Increased level of patient balance activities today. Patient was challenged but able to tolerate all exercises. Balance continues to be an issue for patient, however patient seems to be gaining confience in LE strength and able to ambulate at home without rollator. Patient demonstrating greater deficits in L LE than R. Updated patient HEP to incorporate more balance activities at home. Will continue to progress to patient tolerance.     PT Treatment/Interventions  ADLs/Self Care Home Management;Cryotherapy;Electrical Stimulation;Moist Heat;Ultrasound;Neuromuscular re-education;Balance training;Therapeutic exercise;Therapeutic activities;Functional mobility training;Stair training;Gait training;Patient/family education;Manual techniques;Passive range of motion;Vasopneumatic Device;Taping;Dry needling    PT Next Visit Plan  no bending, twisting, lifting greater than 15-20#    Consulted and Agree with Plan of Care  Patient       Patient will benefit from skilled therapeutic intervention in order to improve the following deficits and impairments:  Abnormal gait, Decreased activity tolerance, Decreased balance, Decreased range of motion, Decreased mobility, Decreased strength, Difficulty walking  Visit Diagnosis: Unsteadiness on feet  Other abnormalities of gait and mobility  Muscle weakness (generalized)  Other symptoms and signs involving the musculoskeletal system     Problem List Patient Active Problem List   Diagnosis Date Noted  . Bone metastases ([place]) 04/27/2017  . Breast cancer of upper-outer quadrant of right female breast [facility]) 12/17/2015     Mickle Asper, SPT 05/13/17 3:04 PM    [facility] [address]  [place], [place], [address] Phone: [phone]   Fax:  [phone]  Name: Michelle Bowen MRN: [id number] Date of Birth: [birth year]

## 2017-05-18 ENCOUNTER — Ambulatory Visit: Payer: Medicare Other | Admitting: Physical Therapy

## 2017-05-18 ENCOUNTER — Encounter: Payer: Self-pay | Admitting: Physical Therapy

## 2017-05-18 DIAGNOSIS — R2681 Unsteadiness on feet: Secondary | ICD-10-CM

## 2017-05-18 DIAGNOSIS — R29898 Other symptoms and signs involving the musculoskeletal system: Secondary | ICD-10-CM

## 2017-05-18 DIAGNOSIS — R2689 Other abnormalities of gait and mobility: Secondary | ICD-10-CM

## 2017-05-18 DIAGNOSIS — M6281 Muscle weakness (generalized): Secondary | ICD-10-CM

## 2017-05-18 NOTE — Therapy (Signed)
[facility] [address]  [place], [place], [address] Phone: [phone]   Fax:  [phone]  Physical Therapy Treatment  Patient Details  Name: Michelle Bowen MRN: [id number] Date of Birth: 03-Sep-1940 Referring Provider: Dr. Sharyn Lull   Encounter Date: 05/18/2017  PT End of Session - 05/18/17 1410    Visit Number  9    Number of Visits  12    Date for PT Re-Evaluation  06/01/17    Authorization Type  Medicare    PT Start Time  1400    PT Stop Time  1440    PT Time Calculation (min)  40 min    Activity Tolerance  Patient tolerated treatment well    Behavior During Therapy  [facility] for tasks assessed/performed       Past Medical History:  Diagnosis Date  . Arthritis    bil knees, left thumb  . Breast cancer ([place]) 12/06/15   Right  . Hypertension     Past Surgical History:  Procedure Laterality Date  . BREAST LUMPECTOMY WITH AXILLARY LYMPH NODE DISSECTION Right 01/17/2016   Procedure: RIGHT BREAST LUMPECTOMY WITH AXILLARY LYMPH NODE DISSECTION;  Surgeon: [name], MD;  Location: [place];  Service: General;  Laterality: Right;  . BREAST SURGERY Right 2001   sebaceous cyst removal  . RADIOACTIVE SEED GUIDED EXCISIONAL BREAST BIOPSY Left 01/17/2016   Procedure: RADIOACTIVE SEED GUIDED EXCISIONAL LEFT BREAST BIOPSY;  Surgeon: [name], MD;  Location: [facility];  Service: General;  Laterality: Left;  . RE-EXCISION OF BREAST LUMPECTOMY Right 02/05/2016   Procedure: RE-EXCISION OF BREAST LUMPECTOMY AND REMOVAL OF NIPPLE;  Surgeon: [name], MD;  Location: [place];  Service: General;  Laterality: Right;  . TONSILLECTOMY      There were no vitals filed for this visit.  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  No/denies    Pain Score  0-No pain                      [facility] Adult PT Treatment/Exercise - 05/18/17 0001      Ambulation/Gait   Gait Comments  no AD - ~400 feet - good step through pattern with good arm swing      Knee/Hip Exercises: Aerobic   Recumbent Bike  L1 x 6 min      Knee/Hip Exercises: Standing   Step Down  Right;Left;10 reps;Hand Hold: 2;Step Height: 6"      Knee/Hip Exercises: Seated   Long Arc Quad  Right;Left;10 reps;Weights    Long Arc Quad Weight  3 lbs.    Long [employer] Limitations  with ball squeeze      Knee/Hip Exercises: Supine   Straight Leg Raises  Both;2 sets;10 reps    Straight Leg Raises Limitations  little height      Knee/Hip Exercises: Sidelying   Clams  B LE - red tband - 2 x 10 reps               PT Short Term Goals - 04/22/17 1425      PT SHORT TERM GOAL #1   Title  patient to be independent with  initial HEP for strength and balance    Status  On-going      PT SHORT TERM GOAL #2   Title  Patient to improve TUG to 13.5 seconds demonstrating reduced fall risk    Status  On-going        PT Long Term Goals - 04/22/17 1425      PT LONG TERM GOAL #1   Title  patient to improve B LE strength to >/= 4/5 needed for improved functional mobility    Status  On-going      PT LONG TERM GOAL #2   Title  patient to improve Berg Balance score to 46/56 demonstrating reduced fall risk    Status  On-going      PT LONG TERM GOAL #3   Title  patient to demonstrate SL stance of >/= 6 seconds    Status  On-going      PT LONG TERM GOAL #4   Title  patient to ambulate on various surfaces with SPC with no evidence of LOB or instability    Status  On-going      PT LONG TERM GOAL #5   Title  Patient to initiate and maintain walking program for exercise for 3-5 days/week     Status  On-going            Plan - 05/18/17 1441    Clinical Impression  Statement  Ms. Soltero doing well today - patient reports sleeping without brace, therefore some of session included supine/sidelying activities without brace as well as observation of rolling and supine to sit transfers with some twisting noted during rolling R to L with VC for log rolling technique; good sidelying to sit transfer. Much improved gait as well wihtout AD with good step through pattern and arm swing.     PT Treatment/Interventions  ADLs/Self Care Home Management;Cryotherapy;Electrical Stimulation;Moist Heat;Ultrasound;Neuromuscular re-education;Balance training;Therapeutic exercise;Therapeutic activities;Functional mobility training;Stair training;Gait training;Patient/family education;Manual techniques;Passive range of motion;Vasopneumatic Device;Taping;Dry needling    PT Next Visit Plan  no bending, twisting, lifting greater than 15-20#    Consulted and Agree with Plan of Care  Patient       Patient will benefit from skilled therapeutic intervention in order to improve the following deficits and impairments:  Abnormal gait, Decreased activity tolerance, Decreased balance, Decreased range of motion, Decreased mobility, Decreased strength, Difficulty walking  Visit Diagnosis: Unsteadiness on feet  Other abnormalities of gait and mobility  Muscle weakness (generalized)  Other symptoms and signs involving the musculoskeletal system     Problem List Patient Active Problem List   Diagnosis Date Noted  . Bone metastases ([place]) 04/27/2017  . Breast cancer of upper-outer quadrant of right female breast ([place]) 12/17/2015    Lanney Gins, PT, DPT 05/18/17 2:45 PM   [facility] [address]  [place], [place], [address] Phone: [phone]   Fax:  [phone]  Name: Michelle Bowen MRN: [id number] Date of Birth: Feb 18, 1941

## 2017-05-20 ENCOUNTER — Ambulatory Visit: Payer: Medicare Other | Admitting: Physical Therapy

## 2017-05-20 ENCOUNTER — Encounter: Payer: Self-pay | Admitting: Physical Therapy

## 2017-05-20 DIAGNOSIS — R2689 Other abnormalities of gait and mobility: Secondary | ICD-10-CM

## 2017-05-20 DIAGNOSIS — R2681 Unsteadiness on feet: Secondary | ICD-10-CM

## 2017-05-20 DIAGNOSIS — R29898 Other symptoms and signs involving the musculoskeletal system: Secondary | ICD-10-CM

## 2017-05-20 DIAGNOSIS — M6281 Muscle weakness (generalized): Secondary | ICD-10-CM

## 2017-05-20 NOTE — Therapy (Signed)
[facility] [address]  [place], [place], [address] Phone: [phone]   Fax:  [phone]  Physical Therapy Treatment  Patient Details  Name: Michelle Bowen MRN: [id number] Date of Birth: 04-Nov-1940 Referring Provider: Dr. Sharyn Lull   Encounter Date: 05/20/2017  PT End of Session - 05/20/17 1500    Visit Number  10    Number of Visits  20    Date for PT Re-Evaluation  07/01/17    Authorization Type  Medicare    PT Start Time  1406    PT Stop Time  1453    PT Time Calculation (min)  47 min    Activity Tolerance  Patient tolerated treatment well    Behavior During Therapy  [facility] for tasks assessed/performed       Past Medical History:  Diagnosis Date  . Arthritis    bil knees, left thumb  . Breast cancer ([place]) 12/06/15   Right  . Hypertension     Past Surgical History:  Procedure Laterality Date  . BREAST LUMPECTOMY WITH AXILLARY LYMPH NODE DISSECTION Right 01/17/2016   Procedure: RIGHT BREAST LUMPECTOMY WITH AXILLARY LYMPH NODE DISSECTION;  Surgeon: [name], MD;  Location: [place];  Service: General;  Laterality: Right;  . BREAST SURGERY Right 2001   sebaceous cyst removal  . RADIOACTIVE SEED GUIDED EXCISIONAL BREAST BIOPSY Left 01/17/2016   Procedure: RADIOACTIVE SEED GUIDED EXCISIONAL LEFT BREAST BIOPSY;  Surgeon: [name], MD;  Location: [place];  Service: General;  Laterality: Left;  . RE-EXCISION OF BREAST LUMPECTOMY Right 02/05/2016   Procedure: RE-EXCISION OF BREAST LUMPECTOMY AND REMOVAL OF NIPPLE;  Surgeon: [name], MD;  Location: [place];  Service: General;  Laterality: Right;  . TONSILLECTOMY      There were no vitals filed for this visit.  Subjective Assessment - 05/20/17 1455    Subjective  Patient feeling good today with no pain.     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   No/denies    Pain Score  0-No pain         [facility] PT Assessment - 05/20/17 1410      Strength   Strength Assessment Site  Hip;Knee;Ankle    Right/Left Hip  Right;Left    Right Hip Flexion  3+/5    Right Hip ABduction  4/5    Right Hip ADduction  4/5    Left Hip Flexion  3+/5    Left Hip ABduction  4/5    Left Hip ADduction  4/5    Right/Left Knee  Right;Left    Right Knee Flexion  4+/5    Right Knee Extension  5/5    Left Knee Flexion  4/5    Left Knee Extension  5/5    Right/Left Ankle  Right;Left    Right Ankle Dorsiflexion  4/5    Left Ankle Dorsiflexion  4/5      Berg Balance Test   Sit to Stand  Able to stand without using hands and stabilize independently    Standing Unsupported  Able to stand safely 2 minutes    Sitting with Back Unsupported but Feet Supported on Floor or Stool  Able to sit safely and securely 2 minutes    Stand to Sit  Sits safely with minimal use of hands    Transfers  Able to transfer safely, minor use of  hands    Standing Unsupported with Eyes Closed  Able to stand 10 seconds safely    Standing Ubsupported with Feet Together  Able to place feet together independently and stand 1 minute safely    From Standing, Reach Forward with Outstretched Arm  Can reach forward >12 cm safely (5")    From Standing Position, Pick up Object from Floor  Unable to pick up shoe, but reaches 2-5 cm (1-2") from shoe and balances independently    From Standing Position, Turn to Look Behind Over each Shoulder  Turn sideways only but maintains balance    Turn 360 Degrees  Able to turn 360 degrees safely one side only in 4 seconds or less    Standing Unsupported, Alternately Place Feet on Step/Stool  Able to stand independently and complete 8 steps >20 seconds    Standing Unsupported, One Foot in Front  Able to plae foot ahead of the other independently and hold 30 seconds    Standing on One Leg  Able to lift leg independently and hold 5-10 seconds    Total Score  47       Timed Up and Go Test   TUG  Normal TUG    Normal TUG (seconds)  12.36                  [facility] Adult PT Treatment/Exercise - 05/20/17 1457      Knee/Hip Exercises: Aerobic   Recumbent Bike  L1 x 6 min      Knee/Hip Exercises: Standing   Hip Flexion  Right;Left;15 reps    Hip Flexion Limitations  2# on ankles; alt. marching    Hip Abduction  Right;Left;10 reps;Knee straight    Abduction Limitations  2# on ankles; hand hold assist      Knee/Hip Exercises: Seated   Long Arc Quad  Right;Left;15 reps    Long Arc Quad Weight  2 lbs.               PT Short Term Goals - 05/20/17 1509      PT SHORT TERM GOAL #1   Title  patient to be independent with initial HEP for strength and balance    Status  Achieved      PT SHORT TERM GOAL #2   Title  Patient to improve TUG to 13.5 seconds demonstrating reduced fall risk    Status  Achieved        PT Long Term Goals - 05/20/17 1509      PT LONG TERM GOAL #1   Title  patient to improve B LE strength to >/= 4/5 needed for improved functional mobility    Status  On-going      PT LONG TERM GOAL #2   Title  patient to improve Berg Balance score to 46/56 demonstrating reduced fall risk    Status  Achieved      PT LONG TERM GOAL #3   Title  patient to demonstrate SL stance of >/= 6 seconds    Status  Partially Met Acheived for stance on R LE, ongoing for stance on L LE      PT LONG TERM GOAL #4   Title  patient to ambulate on various surfaces with SPC with no evidence of LOB or instability    Status  On-going began cane ambulation today; advised pt to wean from rollator at home      PT LONG TERM GOAL #5   Title  Patient to initiate and  maintain walking program for exercise for 3-5 days/week     Status  On-going            Plan - 05/30/2017 1501    Clinical Impression Statement  Reassessed patient goals, strength and balance today. Patient has made significant improvements in balance and gait velocity, some  improvements in LE strength with B hip flexion continuing to be patient's most limiting factor. Worked today on ambulation with SPC; patient doing well with 2 point step through gait pattern and maintaining upright posture. Advised patient to begin utilizing quad cane at home instead of rolling walker for support. Also encouraged patient to increase walking distance/time during the day at home as to promote increased LE strength and endurance as well as cardiovascular health.    PT Treatment/Interventions  ADLs/Self Care Home Management;Cryotherapy;Electrical Stimulation;Moist Heat;Ultrasound;Neuromuscular re-education;Balance training;Therapeutic exercise;Therapeutic activities;Functional mobility training;Stair training;Gait training;Patient/family education;Manual techniques;Passive range of motion;Vasopneumatic Device;Taping;Dry needling    PT Next Visit Plan  no bending, twisting, lifting greater than 15-20#    Consulted and Agree with Plan of Care  Patient       Patient will benefit from skilled therapeutic intervention in order to improve the following deficits and impairments:  Abnormal gait, Decreased activity tolerance, Decreased balance, Decreased range of motion, Decreased mobility, Decreased strength, Difficulty walking  Visit Diagnosis: Unsteadiness on feet  Other abnormalities of gait and mobility  Muscle weakness (generalized)  Other symptoms and signs involving the musculoskeletal system   G-Codes - 05-30-2017 1511    Functional Assessment Tool Used (Outpatient Only)  FOTO 66% (34% limited)    Functional Limitation  Mobility: Walking and moving around    Mobility: Walking and Moving Around Current Status (Q1975)  At least 20 percent but less than 40 percent impaired, limited or restricted    Mobility: Walking and Moving Around Goal Status [id number])  At least 20 percent but less than 40 percent impaired, limited or restricted       Problem List Patient Active Problem List    Diagnosis Date Noted  . Bone metastases ([place]) 04/27/2017  . Breast cancer of upper-outer quadrant of right female breast [facility]) 12/17/2015     Mickle Asper, SPT 05-30-17 4:45 PM    [facility] [place], [place], [address] Phone: [phone]   Fax:  [phone]  Name: Michelle Bowen MRN: [id number] Date of Birth: 06-01-1941

## 2017-05-21 ENCOUNTER — Ambulatory Visit: Payer: Medicare Other | Admitting: Family

## 2017-05-21 ENCOUNTER — Ambulatory Visit (HOSPITAL_BASED_OUTPATIENT_CLINIC_OR_DEPARTMENT_OTHER): Payer: Medicare Other

## 2017-05-21 ENCOUNTER — Other Ambulatory Visit: Payer: Medicare Other

## 2017-05-21 ENCOUNTER — Other Ambulatory Visit (HOSPITAL_BASED_OUTPATIENT_CLINIC_OR_DEPARTMENT_OTHER): Payer: Medicare Other

## 2017-05-21 VITALS — BP 181/90 | HR 71 | Temp 98.1°F | Resp 16

## 2017-05-21 DIAGNOSIS — C7951 Secondary malignant neoplasm of bone: Secondary | ICD-10-CM

## 2017-05-21 DIAGNOSIS — Z170 Estrogen receptor positive status [ER+]: Secondary | ICD-10-CM

## 2017-05-21 DIAGNOSIS — C50411 Malignant neoplasm of upper-outer quadrant of right female breast: Secondary | ICD-10-CM

## 2017-05-21 DIAGNOSIS — Z5112 Encounter for antineoplastic immunotherapy: Secondary | ICD-10-CM

## 2017-05-21 LAB — CMP (CANCER CENTER ONLY)
ALK PHOS: 95 U/L — AB (ref 26–84)
ALT(SGPT): 10 U/L (ref 10–47)
AST: 19 U/L (ref 11–38)
Albumin: 3.5 g/dL (ref 3.3–5.5)
BILIRUBIN TOTAL: 0.8 mg/dL (ref 0.20–1.60)
BUN: 12 mg/dL (ref 7–22)
CHLORIDE: 105 meq/L (ref 98–108)
CO2: 28 meq/L (ref 18–33)
Calcium: 8.5 mg/dL (ref 8.0–10.3)
Creat: 1.2 mg/dl (ref 0.6–1.2)
Glucose, Bld: 87 mg/dL (ref 73–118)
Potassium: 3.5 mEq/L (ref 3.3–4.7)
SODIUM: 140 meq/L (ref 128–145)
Total Protein: 7.2 g/dL (ref 6.4–8.1)

## 2017-05-21 LAB — CBC WITH DIFFERENTIAL (CANCER CENTER ONLY)
BASO#: 0 10*3/uL (ref 0.0–0.2)
BASO%: 0.3 % (ref 0.0–2.0)
EOS ABS: 0.1 10*3/uL (ref 0.0–0.5)
EOS%: 1.5 % (ref 0.0–7.0)
HCT: 30.4 % — ABNORMAL LOW (ref 34.8–46.6)
HGB: 9.7 g/dL — ABNORMAL LOW (ref 11.6–15.9)
LYMPH#: 0.9 10*3/uL (ref 0.9–3.3)
LYMPH%: 26.5 % (ref 14.0–48.0)
MCH: 27.8 pg (ref 26.0–34.0)
MCHC: 31.9 g/dL — AB (ref 32.0–36.0)
MCV: 87 fL (ref 81–101)
MONO#: 0.4 10*3/uL (ref 0.1–0.9)
MONO%: 13.1 % — AB (ref 0.0–13.0)
NEUT#: 2 10*3/uL (ref 1.5–6.5)
NEUT%: 58.6 % (ref 39.6–80.0)
PLATELETS: 273 10*3/uL (ref 145–400)
RBC: 3.49 10*6/uL — ABNORMAL LOW (ref 3.70–5.32)
RDW: 14.8 % (ref 11.1–15.7)
WBC: 3.4 10*3/uL — AB (ref 3.9–10.0)

## 2017-05-21 MED ORDER — DIPHENHYDRAMINE HCL 25 MG PO CAPS
50.0000 mg | ORAL_CAPSULE | Freq: Once | ORAL | Status: DC
  Administered 2017-05-21: 12.5 mg via ORAL

## 2017-05-21 MED ORDER — DIPHENHYDRAMINE HCL 25 MG PO CAPS
ORAL_CAPSULE | ORAL | Status: AC
  Filled 2017-05-21: qty 2

## 2017-05-21 MED ORDER — SODIUM CHLORIDE 0.9% FLUSH
10.0000 mL | INTRAVENOUS | Status: DC | PRN
  Administered 2017-05-21: 10 mL
  Filled 2017-05-21: qty 10

## 2017-05-21 MED ORDER — SODIUM CHLORIDE 0.9 % IV SOLN
20.0000 mg | Freq: Once | INTRAVENOUS | Status: AC
  Administered 2017-05-21: 20 mg via INTRAVENOUS
  Filled 2017-05-21: qty 2

## 2017-05-21 MED ORDER — DIPHENHYDRAMINE HCL 50 MG/ML IJ SOLN
INTRAMUSCULAR | Status: AC
  Filled 2017-05-21: qty 1

## 2017-05-21 MED ORDER — DIPHENHYDRAMINE HCL 50 MG/ML IJ SOLN: 12.5000 mg | Freq: Once | INTRAMUSCULAR | Status: AC

## 2017-05-21 MED ORDER — SODIUM CHLORIDE 0.9 % IV SOLN
Freq: Once | INTRAVENOUS | Status: AC
  Administered 2017-05-21: 15:00:00 via INTRAVENOUS

## 2017-05-21 MED ORDER — TRASTUZUMAB CHEMO 150 MG IV SOLR
6.0000 mg/kg | Freq: Once | INTRAVENOUS | Status: AC
  Administered 2017-05-21: 357 mg via INTRAVENOUS
  Filled 2017-05-21: qty 17

## 2017-05-21 MED ORDER — HEPARIN SOD (PORK) LOCK FLUSH 100 UNIT/ML IV SOLN
500.0000 [IU] | Freq: Once | INTRAVENOUS | Status: AC | PRN
  Administered 2017-05-21: 500 [IU]
  Filled 2017-05-21: qty 5

## 2017-05-21 NOTE — Patient Instructions (Signed)
Trastuzumab injection for infusion What is this medicine? TRASTUZUMAB (tras TOO zoo mab) is a monoclonal antibody. It is used to treat breast cancer and stomach cancer. This medicine may be used for other purposes; ask your health care provider or pharmacist if you have questions. COMMON BRAND NAME(S): Herceptin What should I tell my health care provider before I take this medicine? They need to know if you have any of these conditions: -heart disease -heart failure -lung or breathing disease, like asthma -an unusual or allergic reaction to trastuzumab, benzyl alcohol, or other medications, foods, dyes, or preservatives -pregnant or trying to get pregnant -breast-feeding How should I use this medicine? This drug is given as an infusion into a vein. It is administered in a hospital or clinic by a specially trained health care professional. Talk to your pediatrician regarding the use of this medicine in children. This medicine is not approved for use in children. Overdosage: If you think you have taken too much of this medicine contact a poison control center or emergency room at once. NOTE: This medicine is only for you. Do not share this medicine with others. What if I miss a dose? It is important not to miss a dose. Call your doctor or health care professional if you are unable to keep an appointment. What may interact with this medicine? This medicine may interact with the following medications: -certain types of chemotherapy, such as daunorubicin, doxorubicin, epirubicin, and idarubicin This list may not describe all possible interactions. Give your health care provider a list of all the medicines, herbs, non-prescription drugs, or dietary supplements you use. Also tell them if you smoke, drink alcohol, or use illegal drugs. Some items may interact with your medicine. What should I watch for while using this medicine? Visit your doctor for checks on your progress. Report any side effects.  Continue your course of treatment even though you feel ill unless your doctor tells you to stop. Call your doctor or health care professional for advice if you get a fever, chills or sore throat, or other symptoms of a cold or flu. Do not treat yourself. Try to avoid being around people who are sick. You may experience fever, chills and shaking during your first infusion. These effects are usually mild and can be treated with other medicines. Report any side effects during the infusion to your health care professional. Fever and chills usually do not happen with later infusions. Do not become pregnant while taking this medicine or for 7 months after stopping it. Women should inform their doctor if they wish to become pregnant or think they might be pregnant. Women of child-bearing potential will need to have a negative pregnancy test before starting this medicine. There is a potential for serious side effects to an unborn child. Talk to your health care professional or pharmacist for more information. Do not breast-feed an infant while taking this medicine or for 7 months after stopping it. Women must use effective birth control with this medicine. What side effects may I notice from receiving this medicine? Side effects that you should report to your doctor or health care professional as soon as possible: -allergic reactions like skin rash, itching or hives, swelling of the face, lips, or tongue -chest pain or palpitations -cough -dizziness -feeling faint or lightheaded, falls -fever -general ill feeling or flu-like symptoms -signs of worsening heart failure like breathing problems; swelling in your legs and feet -unusually weak or tired Side effects that usually do not require medical   attention (report to your doctor or health care professional if they continue or are bothersome): -bone pain -changes in taste -diarrhea -joint pain -nausea/vomiting -weight loss This list may not describe all  possible side effects. Call your doctor for medical advice about side effects. You may report side effects to FDA at 1-800-FDA-1088. Where should I keep my medicine? This drug is given in a hospital or clinic and will not be stored at home. NOTE: This sheet is a summary. It may not cover all possible information. If you have questions about this medicine, talk to your doctor, pharmacist, or health care provider.  2018 Elsevier/Gold Standard (2016-06-03 14:37:52)  

## 2017-05-25 ENCOUNTER — Ambulatory Visit: Payer: Medicare Other | Attending: Neurological Surgery | Admitting: Physical Therapy

## 2017-05-25 ENCOUNTER — Encounter: Payer: Self-pay | Admitting: Physical Therapy

## 2017-05-25 DIAGNOSIS — R29898 Other symptoms and signs involving the musculoskeletal system: Secondary | ICD-10-CM | POA: Insufficient documentation

## 2017-05-25 DIAGNOSIS — R2689 Other abnormalities of gait and mobility: Secondary | ICD-10-CM | POA: Diagnosis present

## 2017-05-25 DIAGNOSIS — R2681 Unsteadiness on feet: Secondary | ICD-10-CM | POA: Insufficient documentation

## 2017-05-25 DIAGNOSIS — M6281 Muscle weakness (generalized): Secondary | ICD-10-CM | POA: Insufficient documentation

## 2017-05-25 NOTE — Therapy (Signed)
[facility] [address]  [place], [place], [address] Phone: [phone]   Fax:  [phone]  Physical Therapy Treatment  Patient Details  Name: Michelle Bowen MRN: [id number] Date of Birth: 02-01-1941 Referring Provider: Dr. Sharyn Lull   Encounter Date: 05/25/2017  PT End of Session - 05/25/17 1450    Visit Number  11    Number of Visits  20    Date for PT Re-Evaluation  07/01/17    Authorization Type  Medicare    PT Start Time  2202    PT Stop Time  1445    PT Time Calculation (min)  40 min    Activity Tolerance  Patient tolerated treatment well    Behavior During Therapy  [facility] for tasks assessed/performed       Past Medical History:  Diagnosis Date  . Arthritis    bil knees, left thumb  . Breast cancer ([place]) 12/06/15   Right  . Hypertension     Past Surgical History:  Procedure Laterality Date  . BREAST LUMPECTOMY WITH AXILLARY LYMPH NODE DISSECTION Right 01/17/2016   Procedure: RIGHT BREAST LUMPECTOMY WITH AXILLARY LYMPH NODE DISSECTION;  Surgeon: [name], MD;  Location: [place];  Service: General;  Laterality: Right;  . BREAST SURGERY Right 2001   sebaceous cyst removal  . RADIOACTIVE SEED GUIDED EXCISIONAL BREAST BIOPSY Left 01/17/2016   Procedure: RADIOACTIVE SEED GUIDED EXCISIONAL LEFT BREAST BIOPSY;  Surgeon: [name], MD;  Location: [place];  Service: General;  Laterality: Left;  . RE-EXCISION OF BREAST LUMPECTOMY Right 02/05/2016   Procedure: RE-EXCISION OF BREAST LUMPECTOMY AND REMOVAL OF NIPPLE;  Surgeon: [name], MD;  Location: [place];  Service: General;  Laterality: Right;  . TONSILLECTOMY      There were no vitals filed for this visit.  Subjective Assessment - 05/25/17 1404    Subjective  Patient having a bit of discomfort in left low back/abdomen area. Taking meds over the weekend to control pain. Not feeling well since  infusion but doing ok today.    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  Yes    Pain Score  3     Pain Location  Back    Pain Orientation  Left;Lower    Pain Descriptors / Indicators  Discomfort                      [facility] Adult PT Treatment/Exercise - 05/25/17 0001      Ambulation/Gait   Gait Comments  no AD; 445f; good step length, able to increase speed and maintain pace throughout      Knee/Hip Exercises: Aerobic   Nustep  Lvl 4, 6 min       Knee/Hip Exercises: Standing   Functional Squat  15 reps UE support at counter    Other Standing Knee Exercises  side stepping; red tband; [address]    Other Standing Knee Exercises  monster walk forward/backward/; red tband; 2x 30 ft each      Knee/Hip Exercises: Seated   Long Arc Quad  Right;Left;15 reps    Long Arc Quad Weight  3 lbs.    Marching  Right;Left;15 reps    Marching Limitations  3#               PT Short Term Goals - 05/20/17 [phone]  PT SHORT TERM GOAL #1   Title  patient to be independent with initial HEP for strength and balance    Status  Achieved      PT SHORT TERM GOAL #2   Title  Patient to improve TUG to 13.5 seconds demonstrating reduced fall risk    Status  Achieved        PT Long Term Goals - 05/20/17 1509      PT LONG TERM GOAL #1   Title  patient to improve B LE strength to >/= 4/5 needed for improved functional mobility    Status  On-going      PT LONG TERM GOAL #2   Title  patient to improve Berg Balance score to 46/56 demonstrating reduced fall risk    Status  Achieved      PT LONG TERM GOAL #3   Title  patient to demonstrate SL stance of >/= 6 seconds    Status  Partially Met Acheived for stance on R LE, ongoing for stance on L LE      PT LONG TERM GOAL #4   Title  patient to ambulate on various surfaces with SPC with no evidence of LOB or instability    Status  On-going began cane ambulation today; advised pt to  wean from rollator at home      PT LONG TERM GOAL #5   Title  Patient to initiate and maintain walking program for exercise for 3-5 days/week     Status  On-going            Plan - 05/25/17 1450    Clinical Impression Statement  Patient continuing to progress with strengthening and balance. Able to slightly increase weight/resistance for LE strengthening activities. Patient able to perform standing theraband activities with no UE support, demonstrating significant improvements in balance. Patient ambulated 459f at increased gait speed with no AD for endurance with minimal cueing for forward gaze and step length.     PT Treatment/Interventions  ADLs/Self Care Home Management;Cryotherapy;Electrical Stimulation;Moist Heat;Ultrasound;Neuromuscular re-education;Balance training;Therapeutic exercise;Therapeutic activities;Functional mobility training;Stair training;Gait training;Patient/family education;Manual techniques;Passive range of motion;Vasopneumatic Device;Taping;Dry needling    PT Next Visit Plan  no bending, twisting, lifting greater than 15-20#    Consulted and Agree with Plan of Care  Patient       Patient will benefit from skilled therapeutic intervention in order to improve the following deficits and impairments:  Abnormal gait, Decreased activity tolerance, Decreased balance, Decreased range of motion, Decreased mobility, Decreased strength, Difficulty walking  Visit Diagnosis: Unsteadiness on feet  Other abnormalities of gait and mobility  Muscle weakness (generalized)  Other symptoms and signs involving the musculoskeletal system     Problem List Patient Active Problem List   Diagnosis Date Noted  . [place] ([place] 04/27/2017  . Breast cancer of upper-outer quadrant of right female breast ([facility] 12/17/2015     KMickle Asper SPT 05/25/17 2:55 PM    [facility] [address] [place] [address]: [phone]  Fax:  [phone] Name: Michelle KUENNENMRN: [id number] of Birth: 607-25-1942

## 2017-05-25 NOTE — Patient Instructions (Signed)
Squat: Double Leg (Supported)    With feet shoulder width apart, hold support. Squat, keeping lower leg vertical, knee in line with second toe. Use legs, do not pull up and down with arms. Repeat _10__ times per set. Do _2__ sets per session.

## 2017-05-27 ENCOUNTER — Encounter: Payer: Self-pay | Admitting: Physical Therapy

## 2017-05-27 ENCOUNTER — Ambulatory Visit: Payer: Medicare Other | Admitting: Physical Therapy

## 2017-05-27 DIAGNOSIS — M6281 Muscle weakness (generalized): Secondary | ICD-10-CM

## 2017-05-27 DIAGNOSIS — R29898 Other symptoms and signs involving the musculoskeletal system: Secondary | ICD-10-CM

## 2017-05-27 DIAGNOSIS — R2689 Other abnormalities of gait and mobility: Secondary | ICD-10-CM

## 2017-05-27 DIAGNOSIS — R2681 Unsteadiness on feet: Secondary | ICD-10-CM

## 2017-05-27 NOTE — Therapy (Signed)
[facility] [address]  [place], [place], [address] Phone: [phone]   Fax:  [phone]  Physical Therapy Treatment  Patient Details  Name: Michelle Bowen MRN: [id number] Date of Birth: 10/05/1940 Referring Provider: Dr. Sharyn Lull   Encounter Date: 05/27/2017  PT End of Session - 05/27/17 1444    Visit Number  12    Number of Visits  20    Date for PT Re-Evaluation  07/01/17    Authorization Type  Medicare    PT Start Time  3354    PT Stop Time  1445    PT Time Calculation (min)  42 min    Activity Tolerance  Patient tolerated treatment well    Behavior During Therapy  [facility] for tasks assessed/performed       Past Medical History:  Diagnosis Date  . Arthritis    bil knees, left thumb  . Breast cancer ([place]) 12/06/15   Right  . Hypertension     Past Surgical History:  Procedure Laterality Date  . BREAST LUMPECTOMY WITH AXILLARY LYMPH NODE DISSECTION Right 01/17/2016   Procedure: RIGHT BREAST LUMPECTOMY WITH AXILLARY LYMPH NODE DISSECTION;  Surgeon: [name], MD;  Location: [place];  Service: General;  Laterality: Right;  . BREAST SURGERY Right 2001   sebaceous cyst removal  . RADIOACTIVE SEED GUIDED EXCISIONAL BREAST BIOPSY Left 01/17/2016   Procedure: RADIOACTIVE SEED GUIDED EXCISIONAL LEFT BREAST BIOPSY;  Surgeon: [name], MD;  Location: [place];  Service: General;  Laterality: Left;  . RE-EXCISION OF BREAST LUMPECTOMY Right 02/05/2016   Procedure: RE-EXCISION OF BREAST LUMPECTOMY AND REMOVAL OF NIPPLE;  Surgeon: [name], MD;  Location: [place];  Service: General;  Laterality: Right;  . TONSILLECTOMY      There were no vitals filed for this visit.  Subjective Assessment - 05/27/17 1406    Subjective  Patient feeling well today. Continuing to take meds for pain control, but reporting less pain than previous visit.     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  No/denies    Pain Score  0-No pain                      [facility] Adult PT Treatment/Exercise - 05/27/17 1409      Knee/Hip Exercises: Aerobic   Nustep  Lvl 4, 6 min       Knee/Hip Exercises: Standing   Step Down  Right;Left;15 reps;Hand Hold: 2;Step Height: 6"    Other Standing Knee Exercises  alt. toe taps on 8" step; 2 x 15 each LE    Other Standing Knee Exercises  side stepping on balance beam; red tband; 5 laps; hand hold assist      Knee/Hip Exercises: Seated   Other Seated Knee/Hip Exercises  Fitter knee extension; R/L; 2 blue 1 black; 15 reps each      Knee/Hip Exercises: Sidelying   Hip ABduction  Right;Left;2 sets;10 reps    Clams  B LE - 2 x 10 reps               PT Short Term Goals - 05/20/17 1509      PT SHORT TERM GOAL #1   Title  patient to be independent with initial HEP for strength and balance    Status  Achieved      PT SHORT TERM  GOAL #2   Title  Patient to improve TUG to 13.5 seconds demonstrating reduced fall risk    Status  Achieved        PT Long Term Goals - 05/20/17 1509      PT LONG TERM GOAL #1   Title  patient to improve B LE strength to >/= 4/5 needed for improved functional mobility    Status  On-going      PT LONG TERM GOAL #2   Title  patient to improve Berg Balance score to 46/56 demonstrating reduced fall risk    Status  Achieved      PT LONG TERM GOAL #3   Title  patient to demonstrate SL stance of >/= 6 seconds    Status  Partially Met Acheived for stance on R LE, ongoing for stance on L LE      PT LONG TERM GOAL #4   Title  patient to ambulate on various surfaces with SPC with no evidence of LOB or instability    Status  On-going began cane ambulation today; advised pt to wean from rollator at home      PT LONG TERM GOAL #5   Title  Patient to initiate and maintain walking program for exercise for 3-5 days/week     Status   On-going            Plan - 05/27/17 1611    Clinical Impression Statement  Patient progressing well with all activities. Patient is 3 months post-op this week, so discussed following up with MD with patient today regarding bracing and spinal precautions. Patient stating she has no time in mind to return to MD in [place] and no intentions of seeking a new doctor around here. Patient continuing to tolerate higher difficulty of activities, however limitations continue to be present with lumbar brace and core restrictions. Will continue to progress to patient tolerance.    PT Treatment/Interventions  ADLs/Self Care Home Management;Cryotherapy;Electrical Stimulation;Moist Heat;Ultrasound;Neuromuscular re-education;Balance training;Therapeutic exercise;Therapeutic activities;Functional mobility training;Stair training;Gait training;Patient/family education;Manual techniques;Passive range of motion;Vasopneumatic Device;Taping;Dry needling    PT Next Visit Plan  no bending, twisting, lifting greater than 15-20#    Consulted and Agree with Plan of Care  Patient       Patient will benefit from skilled therapeutic intervention in order to improve the following deficits and impairments:  Abnormal gait, Decreased activity tolerance, Decreased balance, Decreased range of motion, Decreased mobility, Decreased strength, Difficulty walking  Visit Diagnosis: Unsteadiness on feet  Other abnormalities of gait and mobility  Muscle weakness (generalized)  Other symptoms and signs involving the musculoskeletal system     Problem List Patient Active Problem List   Diagnosis Date Noted  . Bone metastases ([place]) 04/27/2017  . Breast cancer of upper-outer quadrant of right female breast [facility]) 12/17/2015     Mickle Asper, SPT 05/27/17 4:15 PM    [facility] [address]  [place], [place], [address] Phone: [phone]   Fax:   [phone]  Name: Michelle Bowen MRN: [id number] Date of Birth: [birth year]

## 2017-06-01 ENCOUNTER — Ambulatory Visit: Payer: Medicare Other | Admitting: Physical Therapy

## 2017-06-03 ENCOUNTER — Ambulatory Visit: Payer: Medicare Other | Admitting: Physical Therapy

## 2017-06-08 ENCOUNTER — Encounter: Payer: Self-pay | Admitting: Physical Therapy

## 2017-06-08 ENCOUNTER — Ambulatory Visit: Payer: Medicare Other | Admitting: Physical Therapy

## 2017-06-08 DIAGNOSIS — R2689 Other abnormalities of gait and mobility: Secondary | ICD-10-CM

## 2017-06-08 DIAGNOSIS — R2681 Unsteadiness on feet: Secondary | ICD-10-CM

## 2017-06-08 DIAGNOSIS — R29898 Other symptoms and signs involving the musculoskeletal system: Secondary | ICD-10-CM

## 2017-06-08 DIAGNOSIS — M6281 Muscle weakness (generalized): Secondary | ICD-10-CM

## 2017-06-08 NOTE — Therapy (Signed)
[facility] [address]  [place], [place], [address] Phone: [phone]   Fax:  [phone]  Physical Therapy Treatment  Patient Details  Name: Michelle Bowen MRN: [id number] Date of Birth: August 19, 1940 Referring Provider: Dr. Sharyn Lull   Encounter Date: 06/08/2017  PT End of Session - 06/08/17 1454    Visit Number  13    Number of Visits  20    Date for PT Re-Evaluation  07/01/17    Authorization Type  Medicare    PT Start Time  1449    PT Stop Time  1531    PT Time Calculation (min)  42 min    Activity Tolerance  Patient tolerated treatment well    Behavior During Therapy  [facility] for tasks assessed/performed       Past Medical History:  Diagnosis Date  . Arthritis    bil knees, left thumb  . Breast cancer ([place]) 12/06/15   Right  . Hypertension     Past Surgical History:  Procedure Laterality Date  . BREAST LUMPECTOMY WITH AXILLARY LYMPH NODE DISSECTION Right 01/17/2016   Procedure: RIGHT BREAST LUMPECTOMY WITH AXILLARY LYMPH NODE DISSECTION;  Surgeon: [name], MD;  Location: [facility];  Service: General;  Laterality: Right;  . BREAST SURGERY Right 2001   sebaceous cyst removal  . RADIOACTIVE SEED GUIDED EXCISIONAL BREAST BIOPSY Left 01/17/2016   Procedure: RADIOACTIVE SEED GUIDED EXCISIONAL LEFT BREAST BIOPSY;  Surgeon: [name], MD;  Location: [facility];  Service: General;  Laterality: Left;  . RE-EXCISION OF BREAST LUMPECTOMY Right 02/05/2016   Procedure: RE-EXCISION OF BREAST LUMPECTOMY AND REMOVAL OF NIPPLE;  Surgeon: [name], MD;  Location: [facility];  Service: General;  Laterality: Right;  . TONSILLECTOMY      There were no vitals filed for this visit.  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  No/denies    Pain Score  0-No pain                      [facility] Adult PT Treatment/Exercise - 06/08/17 1455      Knee/Hip Exercises: Aerobic   Nustep  L5 x 8 min      Knee/Hip Exercises: Machines for Strengthening   Cybex Knee Extension  B LE - 10# 2 x 10      Knee/Hip Exercises: Standing   Heel Raises  -- B single leg x 10 each    Other Standing Knee Exercises  narrow BOS on foam - bean bag reach and tosses; B tandem stance on firm with bean bag toss      Knee/Hip Exercises: Seated   Hamstring Curl  Strengthening;Left;Right;15 reps    Hamstring Limitations  green tband    Sit to Sand  10 reps;with UE support;2 sets greent tband at knees; 1st set with seated on AirEx               PT Short Term Goals - 05/20/17 1509      PT SHORT TERM GOAL #1   Title  patient to be independent with initial HEP for strength  and balance    Status  Achieved      PT SHORT TERM GOAL #2   Title  Patient to improve TUG to 13.5 seconds demonstrating reduced fall risk    Status  Achieved        PT Long Term Goals - 05/20/17 1509      PT LONG TERM GOAL #1   Title  patient to improve B LE strength to >/= 4/5 needed for improved functional mobility    Status  On-going      PT LONG TERM GOAL #2   Title  patient to improve Berg Balance score to 46/56 demonstrating reduced fall risk    Status  Achieved      PT LONG TERM GOAL #3   Title  patient to demonstrate SL stance of >/= 6 seconds    Status  Partially Met Acheived for stance on R LE, ongoing for stance on L LE      PT LONG TERM GOAL #4   Title  patient to ambulate on various surfaces with SPC with no evidence of LOB or instability    Status  On-going began cane ambulation today; advised pt to wean from rollator at home      PT LONG TERM GOAL #5   Title  Patient to initiate and maintain walking  program for exercise for 3-5 days/week     Status  On-going            Plan - 06/08/17 1454    Clinical Impression Statement  Patient doing well today with all strengthening tasks today. Able to incorporate more machine work with good tolerance. Patient reports son is Water quality scientist today regarding side/flank pain she has ben having since surgery. Discussion with patient regarding continued weaning from rolator with good carryover.     PT Treatment/Interventions  ADLs/Self Care Home Management;Cryotherapy;Electrical Stimulation;Moist Heat;Ultrasound;Neuromuscular re-education;Balance training;Therapeutic exercise;Therapeutic activities;Functional mobility training;Stair training;Gait training;Patient/family education;Manual techniques;Passive range of motion;Vasopneumatic Device;Taping;Dry needling    PT Next Visit Plan  no bending, twisting, lifting greater than 15-20#, no core work until follow up with MD    Consulted and Agree with Plan of Care  Patient       Patient will benefit from skilled therapeutic intervention in order to improve the following deficits and impairments:  Abnormal gait, Decreased activity tolerance, Decreased balance, Decreased range of motion, Decreased mobility, Decreased strength, Difficulty walking  Visit Diagnosis: Unsteadiness on feet  Other abnormalities of gait and mobility  Muscle weakness (generalized)  Other symptoms and signs involving the musculoskeletal system     Problem List Patient Active Problem List   Diagnosis Date Noted  . Bone metastases ([place]) 04/27/2017  . Breast cancer of upper-outer quadrant of right female breast ([place]) 12/17/2015     Lanney Gins, PT, DPT 06/08/17 3:38 PM   [facility] [address]  [place], [place], [address] Phone: [phone]   Fax:  [phone]  Name: Michelle Bowen MRN: [id number] Date of Birth: 04-Apr-1941

## 2017-06-10 ENCOUNTER — Other Ambulatory Visit: Payer: Self-pay | Admitting: *Deleted

## 2017-06-10 DIAGNOSIS — Z170 Estrogen receptor positive status [ER+]: Principal | ICD-10-CM

## 2017-06-10 DIAGNOSIS — C50411 Malignant neoplasm of upper-outer quadrant of right female breast: Secondary | ICD-10-CM

## 2017-06-11 ENCOUNTER — Other Ambulatory Visit (HOSPITAL_BASED_OUTPATIENT_CLINIC_OR_DEPARTMENT_OTHER): Payer: Medicare Other

## 2017-06-11 ENCOUNTER — Encounter: Payer: Self-pay | Admitting: Hematology & Oncology

## 2017-06-11 ENCOUNTER — Ambulatory Visit (HOSPITAL_BASED_OUTPATIENT_CLINIC_OR_DEPARTMENT_OTHER): Payer: Medicare Other | Admitting: Hematology & Oncology

## 2017-06-11 ENCOUNTER — Other Ambulatory Visit: Payer: Self-pay

## 2017-06-11 ENCOUNTER — Ambulatory Visit: Payer: Medicare Other

## 2017-06-11 ENCOUNTER — Ambulatory Visit (HOSPITAL_BASED_OUTPATIENT_CLINIC_OR_DEPARTMENT_OTHER): Payer: Medicare Other

## 2017-06-11 DIAGNOSIS — T386X5A Adverse effect of antigonadotrophins, antiestrogens, antiandrogens, not elsewhere classified, initial encounter: Secondary | ICD-10-CM

## 2017-06-11 DIAGNOSIS — C7951 Secondary malignant neoplasm of bone: Secondary | ICD-10-CM

## 2017-06-11 DIAGNOSIS — Z5111 Encounter for antineoplastic chemotherapy: Secondary | ICD-10-CM | POA: Diagnosis present

## 2017-06-11 DIAGNOSIS — Z170 Estrogen receptor positive status [ER+]: Principal | ICD-10-CM

## 2017-06-11 DIAGNOSIS — C50411 Malignant neoplasm of upper-outer quadrant of right female breast: Secondary | ICD-10-CM

## 2017-06-11 DIAGNOSIS — Z9221 Personal history of antineoplastic chemotherapy: Secondary | ICD-10-CM

## 2017-06-11 DIAGNOSIS — M818 Other osteoporosis without current pathological fracture: Secondary | ICD-10-CM

## 2017-06-11 DIAGNOSIS — Z7189 Other specified counseling: Secondary | ICD-10-CM

## 2017-06-11 HISTORY — DX: Other specified counseling: Z71.89

## 2017-06-11 LAB — CMP (CANCER CENTER ONLY)
ALBUMIN: 3.7 g/dL (ref 3.3–5.5)
ALT(SGPT): 17 U/L (ref 10–47)
AST: 21 U/L (ref 11–38)
Alkaline Phosphatase: 85 U/L — ABNORMAL HIGH (ref 26–84)
BUN, Bld: 13 mg/dL (ref 7–22)
CALCIUM: 8.9 mg/dL (ref 8.0–10.3)
CHLORIDE: 106 meq/L (ref 98–108)
CO2: 27 meq/L (ref 18–33)
CREATININE: 1.1 mg/dL (ref 0.6–1.2)
Glucose, Bld: 88 mg/dL (ref 73–118)
POTASSIUM: 4.3 meq/L (ref 3.3–4.7)
Sodium: 147 mEq/L — ABNORMAL HIGH (ref 128–145)
TOTAL PROTEIN: 7.6 g/dL (ref 6.4–8.1)
Total Bilirubin: 0.9 mg/dl (ref 0.20–1.60)

## 2017-06-11 LAB — CBC WITH DIFFERENTIAL (CANCER CENTER ONLY)
BASO#: 0 10*3/uL (ref 0.0–0.2)
BASO%: 0 % (ref 0.0–2.0)
EOS ABS: 0.1 10*3/uL (ref 0.0–0.5)
EOS%: 2.5 % (ref 0.0–7.0)
HCT: 31.8 % — ABNORMAL LOW (ref 34.8–46.6)
HEMOGLOBIN: 10.3 g/dL — AB (ref 11.6–15.9)
LYMPH#: 0.9 10*3/uL (ref 0.9–3.3)
LYMPH%: 27.9 % (ref 14.0–48.0)
MCH: 28 pg (ref 26.0–34.0)
MCHC: 32.4 g/dL (ref 32.0–36.0)
MCV: 86 fL (ref 81–101)
MONO#: 0.4 10*3/uL (ref 0.1–0.9)
MONO%: 13.7 % — AB (ref 0.0–13.0)
NEUT%: 55.9 % (ref 39.6–80.0)
NEUTROS ABS: 1.8 10*3/uL (ref 1.5–6.5)
Platelets: 264 10*3/uL (ref 145–400)
RBC: 3.68 10*6/uL — AB (ref 3.70–5.32)
RDW: 15.2 % (ref 11.1–15.7)
WBC: 3.2 10*3/uL — AB (ref 3.9–10.0)

## 2017-06-11 MED ORDER — DIPHENHYDRAMINE HCL 50 MG/ML IJ SOLN
12.5000 mg | Freq: Once | INTRAMUSCULAR | Status: AC
  Administered 2017-06-11: 12.5 mg via INTRAVENOUS

## 2017-06-11 MED ORDER — DIPHENHYDRAMINE HCL 50 MG/ML IJ SOLN
INTRAMUSCULAR | Status: AC
  Filled 2017-06-11: qty 1

## 2017-06-11 MED ORDER — SODIUM CHLORIDE 0.9 % IV SOLN
6.0000 mg/kg | Freq: Once | INTRAVENOUS | Status: AC
  Administered 2017-06-11: 357 mg via INTRAVENOUS
  Filled 2017-06-11: qty 17

## 2017-06-11 MED ORDER — DENOSUMAB 120 MG/1.7ML ~~LOC~~ SOLN
SUBCUTANEOUS | Status: AC
  Filled 2017-06-11: qty 1.7

## 2017-06-11 MED ORDER — HEPARIN SOD (PORK) LOCK FLUSH 100 UNIT/ML IV SOLN
500.0000 [IU] | Freq: Once | INTRAVENOUS | Status: AC | PRN
  Administered 2017-06-11: 500 [IU]
  Filled 2017-06-11: qty 5

## 2017-06-11 MED ORDER — DEXAMETHASONE SODIUM PHOSPHATE 100 MG/10ML IJ SOLN
20.0000 mg | Freq: Once | INTRAMUSCULAR | Status: AC
  Administered 2017-06-11: 20 mg via INTRAVENOUS
  Filled 2017-06-11: qty 2

## 2017-06-11 MED ORDER — OXYCODONE HCL 5 MG PO TABS: 5.0000 mg | ORAL_TABLET | Freq: Four times a day (QID) | ORAL | 0 refills | Status: DC | PRN

## 2017-06-11 MED ORDER — SODIUM CHLORIDE 0.9 % IV SOLN
Freq: Once | INTRAVENOUS | Status: AC
  Administered 2017-06-11: 16:00:00 via INTRAVENOUS

## 2017-06-11 MED ORDER — DENOSUMAB 120 MG/1.7ML ~~LOC~~ SOLN
120.0000 mg | Freq: Once | SUBCUTANEOUS | Status: AC
  Administered 2017-06-11: 120 mg via SUBCUTANEOUS

## 2017-06-11 MED ORDER — SODIUM CHLORIDE 0.9% FLUSH
10.0000 mL | INTRAVENOUS | Status: DC | PRN
  Administered 2017-06-11: 10 mL
  Filled 2017-06-11: qty 10

## 2017-06-11 NOTE — Patient Instructions (Signed)
Trastuzumab injection for infusion What is this medicine? TRASTUZUMAB (tras TOO zoo mab) is a monoclonal antibody. It is used to treat breast cancer and stomach cancer. This medicine may be used for other purposes; ask your health care provider or pharmacist if you have questions. COMMON BRAND NAME(S): Herceptin What should I tell my health care provider before I take this medicine? They need to know if you have any of these conditions: -heart disease -heart failure -lung or breathing disease, like asthma -an unusual or allergic reaction to trastuzumab, benzyl alcohol, or other medications, foods, dyes, or preservatives -pregnant or trying to get pregnant -breast-feeding How should I use this medicine? This drug is given as an infusion into a vein. It is administered in a hospital or clinic by a specially trained health care professional. Talk to your pediatrician regarding the use of this medicine in children. This medicine is not approved for use in children. Overdosage: If you think you have taken too much of this medicine contact a poison control center or emergency room at once. NOTE: This medicine is only for you. Do not share this medicine with others. What if I miss a dose? It is important not to miss a dose. Call your doctor or health care professional if you are unable to keep an appointment. What may interact with this medicine? This medicine may interact with the following medications: -certain types of chemotherapy, such as daunorubicin, doxorubicin, epirubicin, and idarubicin This list may not describe all possible interactions. Give your health care provider a list of all the medicines, herbs, non-prescription drugs, or dietary supplements you use. Also tell them if you smoke, drink alcohol, or use illegal drugs. Some items may interact with your medicine. What should I watch for while using this medicine? Visit your doctor for checks on your progress. Report any side effects.  Continue your course of treatment even though you feel ill unless your doctor tells you to stop. Call your doctor or health care professional for advice if you get a fever, chills or sore throat, or other symptoms of a cold or flu. Do not treat yourself. Try to avoid being around people who are sick. You may experience fever, chills and shaking during your first infusion. These effects are usually mild and can be treated with other medicines. Report any side effects during the infusion to your health care professional. Fever and chills usually do not happen with later infusions. Do not become pregnant while taking this medicine or for 7 months after stopping it. Women should inform their doctor if they wish to become pregnant or think they might be pregnant. Women of child-bearing potential will need to have a negative pregnancy test before starting this medicine. There is a potential for serious side effects to an unborn child. Talk to your health care professional or pharmacist for more information. Do not breast-feed an infant while taking this medicine or for 7 months after stopping it. Women must use effective birth control with this medicine. What side effects may I notice from receiving this medicine? Side effects that you should report to your doctor or health care professional as soon as possible: -allergic reactions like skin rash, itching or hives, swelling of the face, lips, or tongue -chest pain or palpitations -cough -dizziness -feeling faint or lightheaded, falls -fever -general ill feeling or flu-like symptoms -signs of worsening heart failure like breathing problems; swelling in your legs and feet -unusually weak or tired Side effects that usually do not require medical   attention (report to your doctor or health care professional if they continue or are bothersome): -bone pain -changes in taste -diarrhea -joint pain -nausea/vomiting -weight loss This list may not describe all  possible side effects. Call your doctor for medical advice about side effects. You may report side effects to FDA at 1-800-FDA-1088. Where should I keep my medicine? This drug is given in a hospital or clinic and will not be stored at home. NOTE: This sheet is a summary. It may not cover all possible information. If you have questions about this medicine, talk to your doctor, pharmacist, or health care provider.  2018 Elsevier/Gold Standard (2016-06-03 14:37:52)  

## 2017-06-11 NOTE — Progress Notes (Signed)
Hematology and Oncology Follow Up Visit  Michelle Bowen [id number] [birth year] 76 y.o. 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  We first saw her back in early November.  She had been treated down at the [facility] in [place].  She had aggressive therapy down there.  She received chemotherapy.  She was on Taxotere/Herceptin/Perjeta.  She had 6 cycles of treatment.  She also had spinal surgery because of metastasis.  She is doing well.  She is a little more mobile.  She does have a back brace which she does wear on occasion.  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  She does not want Perjeta.  She is very comfortable just using Herceptin alone.  She did have a nice Thanksgiving.  She has been eating well.  She has had no nausea or vomiting.  She has had no problems with bowels or bladder.  Her CA 27.29 when we first saw her was 23.  Her last echocardiogram was done in October down in [place].  It showed an ejection fraction of 58%.  She has had no bleeding.  She has had no leg swelling.  She is doing okay with pain.  She is on oxycodone.  We will go ahead and refill this today.  Overall, her performance status is ECOG 2.  Medications:  Current Outpatient Medications:  .  Cholecalciferol (VITAMIN D3) 5000 units TABS, Take by mouth., Disp: , Rfl:  .  cyanocobalamin 500 MCG tablet, Take 500 mcg by mouth daily., Disp: , Rfl:  .  gabapentin (NEURONTIN) 300 MG capsule, Take 1 capsule (300 mg total) by mouth at bedtime., Disp: , Rfl:  .  Lactobacillus (PROBIOTIC ACIDOPHILUS PO), Take by mouth., Disp: , Rfl:  .  letrozole (FEMARA) 2.5 MG tablet, Take 1 tablet (2.5 mg total) by mouth daily., Disp: 90 tablet, Rfl: 3 .  Misc. Devices MISC, For  Bilateral hip pain, Disp: , Rfl:  .  oxyCODONE (OXY IR/ROXICODONE) 5 MG immediate release tablet, Take by mouth., Disp: , Rfl:  .  triamterene-hydrochlorothiazide (MAXZIDE-25) 37.5-25 MG tablet, Take 1 tablet by mouth daily., Disp: 90 tablet, Rfl: 3 No current facility-administered medications for this visit.   Facility-Administered Medications Ordered in Other Visits:  .  denosumab (XGEVA) injection 120 mg, 120 mg, Subcutaneous, Once, Paysley Poplar R, MD .  heparin lock flush 100 unit/mL, 500 Units, Intracatheter, Once PRN, Talin Feister, Rudell Cobb, MD .  sodium chloride flush (NS) 0.9 % injection 10 mL, 10 mL, Intracatheter, PRN, Volanda Napoleon, MD .  trastuzumab (HERCEPTIN) 357 mg in sodium chloride 0.9 % 250 mL chemo infusion, 6 mg/kg (Treatment Plan Recorded), Intravenous, Once, Volanda Napoleon, MD, Last Rate: 534 mL/hr at 06/11/17 1619, 357 mg at 06/11/17 1619  Allergies:  Allergies  Allergen Reactions  . Acetaminophen Hives    Past Medical History, Surgical history, Social history, and Family History were reviewed and updated.  Review of Systems: Review of Systems  Constitutional: Negative for appetite change, fatigue, fever and unexpected weight change.  HENT:   Negative for lump/mass, mouth sores, sore throat and trouble swallowing.   Respiratory: Negative for cough, hemoptysis and shortness of breath.  Cardiovascular: Negative for leg swelling and palpitations.  Gastrointestinal: Negative for abdominal distention, abdominal pain, blood in stool, constipation, diarrhea, nausea and vomiting.  Genitourinary: Negative for bladder incontinence, dysuria, frequency and hematuria.   Musculoskeletal: Positive for back pain. Negative for arthralgias, gait problem and myalgias.  Skin: Negative for itching and rash.  Neurological: Negative for dizziness, extremity weakness, gait problem, headaches, numbness, seizures and speech difficulty.  Hematological: Does not bruise/bleed easily.    Psychiatric/Behavioral: Negative for depression and sleep disturbance. The patient is not nervous/anxious.     Physical Exam:  vitals were not taken for this visit.   Wt Readings from Last 3 Encounters:  06/11/17 130 lb 4 oz (59.1 kg)  04/30/17 129 lb (58.5 kg)  04/23/17 130 lb 6.4 oz (59.1 kg)    Physical Exam  Constitutional: She is oriented to person, place, and time.  HENT:  Head: Normocephalic and atraumatic.  Mouth/Throat: Oropharynx is clear and moist.  Eyes: EOM are normal. Pupils are equal, round, and reactive to light.  Neck: Normal range of motion.  Cardiovascular: Normal rate, regular rhythm and normal heart sounds.  Pulmonary/Chest: Effort normal and breath sounds normal.  Abdominal: Soft. Bowel sounds are normal.  Musculoskeletal: Normal range of motion. She exhibits no edema, tenderness or deformity.  Lymphadenopathy:    She has no cervical adenopathy.  Neurological: She is alert and oriented to person, place, and time.  Skin: Skin is warm and dry. No rash noted. No erythema.  Psychiatric: She has a normal mood and affect. Her behavior is normal. Judgment and thought content normal.  Vitals reviewed.    Lab Results  Component Value Date   WBC 3.2 (L) 06/11/2017   HGB 10.3 (L) 06/11/2017   HCT 31.8 (L) 06/11/2017   MCV 86 06/11/2017   PLT 264 06/11/2017     Chemistry      Component Value Date/Time   NA 147 (H) 06/11/2017 1345   NA 139 04/23/2017 1256   K 4.3 06/11/2017 1345   K 4.2 04/23/2017 1256   CL 106 06/11/2017 1345   CO2 27 06/11/2017 1345   CO2 28 04/23/2017 1256   BUN 13 06/11/2017 1345   BUN 10.0 04/23/2017 1256   CREATININE 1.1 06/11/2017 1345   CREATININE 0.8 04/23/2017 1256      Component Value Date/Time   CALCIUM 8.9 06/11/2017 1345   CALCIUM 8.7 04/23/2017 1256   ALKPHOS 85 (H) 06/11/2017 1345   ALKPHOS 104 04/23/2017 1256   AST 21 06/11/2017 1345   AST 15 04/23/2017 1256   ALT 17 06/11/2017 1345   ALT <6 04/23/2017 1256    BILITOT 0.90 06/11/2017 1345   BILITOT 0.59 04/23/2017 1256         Impression and Plan: Michelle Bowen is a 76 year old African-American female.  She has metastatic breast cancer.  At this is triple positive disease.  I am happy that she looks better.  I think she is doing better.  We will continue her on the Herceptin.  We probably will have to consider some type of follow-up scan on her in February or March so we can assess how she is responding.  I will have to see what she was followed up with down in [place].  I am happy that her quality of life is doing well.  We will plan to get her back in 3 more weeks.   Volanda Napoleon, MD 12/20/20184:45 PM

## 2017-06-12 LAB — CANCER ANTIGEN 27.29: CA 27.29: 19.6 U/mL (ref 0.0–38.6)

## 2017-06-22 ENCOUNTER — Ambulatory Visit: Payer: Medicare Other | Admitting: Physical Therapy

## 2017-06-29 ENCOUNTER — Ambulatory Visit: Payer: Medicare Other | Attending: Neurological Surgery | Admitting: Physical Therapy

## 2017-06-29 ENCOUNTER — Encounter: Payer: Self-pay | Admitting: Physical Therapy

## 2017-06-29 DIAGNOSIS — M6281 Muscle weakness (generalized): Secondary | ICD-10-CM | POA: Diagnosis present

## 2017-06-29 DIAGNOSIS — R2681 Unsteadiness on feet: Secondary | ICD-10-CM | POA: Insufficient documentation

## 2017-06-29 DIAGNOSIS — R29898 Other symptoms and signs involving the musculoskeletal system: Secondary | ICD-10-CM | POA: Insufficient documentation

## 2017-06-29 DIAGNOSIS — R2689 Other abnormalities of gait and mobility: Secondary | ICD-10-CM | POA: Diagnosis present

## 2017-06-29 NOTE — Therapy (Addendum)
[facility] [address]  [place], [place], [address] Phone: [phone]   Fax:  [phone]  Physical Therapy Treatment  Patient Details  Name: Michelle Bowen MRN: [id number] Date of Birth: 09-Apr-1941 Referring Provider: Dr. Sharyn Lull   Encounter Date: 06/29/2017  PT End of Session - 06/29/17 1327    Visit Number  14    Number of Visits  20    Date for PT Re-Evaluation  07/27/17    Authorization Type  Medicare    PT Start Time  1325    PT Stop Time  1406    PT Time Calculation (min)  41 min    Activity Tolerance  Patient tolerated treatment well    Behavior During Therapy  [facility] for tasks assessed/performed       Past Medical History:  Diagnosis Date  . Arthritis    bil knees, left thumb  . Breast cancer ([place]) 12/06/15   Right  . Counseling regarding goals of care 06/11/2017  . Hypertension     Past Surgical History:  Procedure Laterality Date  . BREAST LUMPECTOMY WITH AXILLARY LYMPH NODE DISSECTION Right 01/17/2016   Procedure: RIGHT BREAST LUMPECTOMY WITH AXILLARY LYMPH NODE DISSECTION;  Surgeon: [name], MD;  Location: [facility];  Service: General;  Laterality: Right;  . BREAST SURGERY Right 2001   sebaceous cyst removal  . RADIOACTIVE SEED GUIDED EXCISIONAL BREAST BIOPSY Left 01/17/2016   Procedure: RADIOACTIVE SEED GUIDED EXCISIONAL LEFT BREAST BIOPSY;  Surgeon: [name], MD;  Location: [facility];  Service: General;  Laterality: Left;  . RE-EXCISION OF BREAST LUMPECTOMY Right 02/05/2016   Procedure: RE-EXCISION OF BREAST LUMPECTOMY AND REMOVAL OF NIPPLE;  Surgeon: [name], MD;  Location: [facility];  Service: General;  Laterality: Right;  . TONSILLECTOMY      There were no vitals filed for this visit.  Subjective Assessment - 06/29/17 1327    Subjective  using quad can this afternoon - still uses rollator for long distances    Pertinent  History  breast cancer with metastasis to spine, HTN    Patient Stated Goals  reduce use of rollator, regain strength    Currently in Pain?  No/denies    Pain Score  0-No pain                      [facility] Adult PT Treatment/Exercise - 06/29/17 1329      Knee/Hip Exercises: Aerobic   Nustep  L5 x 6 min      Knee/Hip Exercises: Standing   Hip Flexion  Stengthening;Right;Left;15 reps;Knee straight    Hip Flexion Limitations  green tband    Hip ADduction  Strengthening;Right;Left;15 reps    Hip ADduction Limitations  green tband    Hip Abduction  Stengthening;Both;15 reps;Knee straight    Abduction Limitations  green tband    Hip Extension  Stengthening;Right;Left;10 reps;Knee straight    Extension Limitations  green tband    Forward Step Up  Right;Left;15 reps;Hand Hold: 0;Step Height: 8" 3# at ankle               PT Short Term Goals - 05/20/17 1509      PT SHORT TERM GOAL #1   Title  patient to be independent with initial HEP for strength and balance    Status  Achieved      PT SHORT TERM GOAL #2   Title  Patient  to improve TUG to 13.5 seconds demonstrating reduced fall risk    Status  Achieved        PT Long Term Goals - 06/29/17 1420      PT LONG TERM GOAL #1   Title  patient to improve B LE strength to >/= 4/5 needed for improved functional mobility    Status  Unable to assess      PT LONG TERM GOAL #2   Title  patient to improve Berg Balance score to 46/56 demonstrating reduced fall risk    Status  Achieved      PT LONG TERM GOAL #3   Title  patient to demonstrate SL stance of >/= 6 seconds    Status  Unable to assess Acheived for stance on R LE, ongoing for stance on L LE      PT LONG TERM GOAL #4   Title  patient to ambulate on various surfaces with SPC with no evidence of LOB or instability    Status  Achieved began cane ambulation today; advised pt to wean from rollator at home      PT LONG TERM GOAL #5   Title  Patient to initiate  and maintain walking program for exercise for 3-5 days/week     Status  Partially Met            Plan - 06/29/17 1328    Clinical Impression Statement  MS. Huntsman doing well today - at end of session, patient stating today will be last visit as she feels she has progressed appropriately and can perform home strengthening tasks independently, therefore, unable to formally assess goals today. Contniued discussion on need for more active lifestyle to include daily walking program with good verbal understanding. Patient presenting today with [facility] that was much too tall for her height, with PT adjusting. Also discussing with patient need to follow up with MD regarding surgical intervention with apteint stating she has no plans to return to MD anytime in the near future. WIll leave patient chart open for 30 days in the event she needs to return.     PT Treatment/Interventions  ADLs/Self Care Home Management;Cryotherapy;Electrical Stimulation;Moist Heat;Ultrasound;Neuromuscular re-education;Balance training;Therapeutic exercise;Therapeutic activities;Functional mobility training;Stair training;Gait training;Patient/family education;Manual techniques;Passive range of motion;Vasopneumatic Device;Taping;Dry needling    PT Next Visit Plan  no bending, twisting, lifting greater than 15-20#, no core work until follow up with MD    Consulted and Agree with Plan of Care  Patient       Patient will benefit from skilled therapeutic intervention in order to improve the following deficits and impairments:  Abnormal gait, Decreased activity tolerance, Decreased balance, Decreased range of motion, Decreased mobility, Decreased strength, Difficulty walking  Visit Diagnosis: Unsteadiness on feet  Other abnormalities of gait and mobility  Muscle weakness (generalized)  Other symptoms and signs involving the musculoskeletal system     G-Codes    Functional Assessment Tool Used (Outpatient Only)  FOTO 66% (34%  limited)    Functional Limitation  Mobility: Walking and moving around    Mobility: Walking and Moving Around Goal Status [phone])  At least 20 percent but less than 40 percent impaired, limited or restricted    Mobility: Walking and Moving Around Current Status ([id number])  At least 20 percent but less than 40 percent impaired, limited or restricted      Problem List Patient Active Problem List   Diagnosis Date Noted  . Counseling regarding goals of care 06/11/2017  . Bone metastases ([place]) 04/27/2017  .  Breast cancer of upper-outer quadrant of right female breast ([place]) 12/17/2015    Lanney Gins, PT, DPT 06/29/17 2:32 PM  PHYSICAL THERAPY DISCHARGE SUMMARY  Visits from Start of Care: 14  Current functional level related to goals / functional outcomes: See above   Remaining deficits: See above   Education / Equipment: HEP  Plan: Patient agrees to discharge.  Patient goals were partially met. Patient is being discharged due to being pleased with the current functional level.  ?????     Lanney Gins, PT, DPT 07/16/17 1:18 PM  [facility] [address]  [place], [place], [address] Phone: [phone]   Fax:  [phone]  Name: CASTELLA LERNER MRN: [id number] Date of Birth: [birth year]

## 2017-07-02 ENCOUNTER — Other Ambulatory Visit: Payer: Medicare Other

## 2017-07-02 ENCOUNTER — Ambulatory Visit: Payer: Medicare Other | Admitting: Family

## 2017-07-02 ENCOUNTER — Inpatient Hospital Stay: Payer: Medicare Other

## 2017-07-02 ENCOUNTER — Other Ambulatory Visit: Payer: Self-pay

## 2017-07-02 ENCOUNTER — Inpatient Hospital Stay: Payer: Medicare Other | Attending: Hematology & Oncology | Admitting: Family

## 2017-07-02 ENCOUNTER — Other Ambulatory Visit: Payer: Self-pay | Admitting: Family

## 2017-07-02 ENCOUNTER — Ambulatory Visit: Payer: Medicare Other

## 2017-07-02 VITALS — BP 124/68 | HR 81 | Resp 18

## 2017-07-02 DIAGNOSIS — C50411 Malignant neoplasm of upper-outer quadrant of right female breast: Secondary | ICD-10-CM

## 2017-07-02 DIAGNOSIS — Z170 Estrogen receptor positive status [ER+]: Secondary | ICD-10-CM | POA: Insufficient documentation

## 2017-07-02 DIAGNOSIS — T386X5A Adverse effect of antigonadotrophins, antiestrogens, antiandrogens, not elsewhere classified, initial encounter: Secondary | ICD-10-CM

## 2017-07-02 DIAGNOSIS — M818 Other osteoporosis without current pathological fracture: Secondary | ICD-10-CM

## 2017-07-02 DIAGNOSIS — C7951 Secondary malignant neoplasm of bone: Secondary | ICD-10-CM | POA: Insufficient documentation

## 2017-07-02 DIAGNOSIS — C50919 Malignant neoplasm of unspecified site of unspecified female breast: Secondary | ICD-10-CM | POA: Insufficient documentation

## 2017-07-02 DIAGNOSIS — Z5112 Encounter for antineoplastic immunotherapy: Secondary | ICD-10-CM | POA: Insufficient documentation

## 2017-07-02 DIAGNOSIS — C50911 Malignant neoplasm of unspecified site of right female breast: Secondary | ICD-10-CM | POA: Diagnosis present

## 2017-07-02 DIAGNOSIS — E559 Vitamin D deficiency, unspecified: Secondary | ICD-10-CM

## 2017-07-02 DIAGNOSIS — Z7189 Other specified counseling: Secondary | ICD-10-CM

## 2017-07-02 LAB — CBC WITH DIFFERENTIAL (CANCER CENTER ONLY)
Basophils Absolute: 0 10*3/uL (ref 0.0–0.1)
Basophils Relative: 0 %
Eosinophils Absolute: 0.1 10*3/uL (ref 0.0–0.5)
Eosinophils Relative: 2 %
HEMATOCRIT: 31.8 % — AB (ref 34.8–46.6)
Hemoglobin: 10.1 g/dL — ABNORMAL LOW (ref 11.6–15.9)
LYMPHS PCT: 25 %
Lymphs Abs: 0.8 10*3/uL — ABNORMAL LOW (ref 0.9–3.3)
MCH: 27.8 pg (ref 26.0–34.0)
MCHC: 31.8 g/dL — AB (ref 32.0–36.0)
MCV: 87.6 fL (ref 81.0–101.0)
MONO ABS: 0.4 10*3/uL (ref 0.1–0.9)
Monocytes Relative: 12 %
NEUTROS ABS: 2 10*3/uL (ref 1.5–6.5)
Neutrophils Relative %: 61 %
Platelet Count: 272 10*3/uL (ref 145–400)
RBC: 3.63 MIL/uL — ABNORMAL LOW (ref 3.70–5.32)
RDW: 15.3 % (ref 11.1–15.7)
WBC Count: 3.3 10*3/uL — ABNORMAL LOW (ref 3.9–10.3)

## 2017-07-02 LAB — CMP (CANCER CENTER ONLY)
ALT: 14 U/L (ref 0–55)
ANION GAP: 11 (ref 5–15)
AST: 24 U/L (ref 5–34)
Albumin: 3.7 g/dL (ref 3.5–5.0)
Alkaline Phosphatase: 76 U/L (ref 40–150)
BILIRUBIN TOTAL: 0.8 mg/dL (ref 0.2–1.2)
BUN: 10 mg/dL (ref 7–26)
CO2: 29 mmol/L (ref 22–29)
Calcium: 8.4 mg/dL (ref 8.4–10.4)
Chloride: 102 mmol/L (ref 98–109)
Creatinine: 0.7 mg/dL (ref 0.60–1.10)
GFR, Est AFR Am: >60 mL/min (ref >60)
Glucose, Bld: 91 mg/dL (ref 70–140)
POTASSIUM: 3.9 mmol/L (ref 3.3–4.7)
Sodium: 142 mmol/L (ref 136–145)
TOTAL PROTEIN: 7.6 g/dL (ref 6.4–8.3)

## 2017-07-02 MED ORDER — SODIUM CHLORIDE 0.9 % IV SOLN
Freq: Once | INTRAVENOUS | Status: AC
  Administered 2017-07-02: 14:00:00 via INTRAVENOUS

## 2017-07-02 MED ORDER — SODIUM CHLORIDE 0.9 % IV SOLN
20.0000 mg | Freq: Once | INTRAVENOUS | Status: AC
  Administered 2017-07-02: 20 mg via INTRAVENOUS
  Filled 2017-07-02: qty 2

## 2017-07-02 MED ORDER — TRASTUZUMAB CHEMO 150 MG IV SOLR
6.0000 mg/kg | Freq: Once | INTRAVENOUS | Status: AC
  Administered 2017-07-02: 357 mg via INTRAVENOUS
  Filled 2017-07-02: qty 17

## 2017-07-02 MED ORDER — DIPHENHYDRAMINE HCL 50 MG/ML IJ SOLN
INTRAMUSCULAR | Status: AC
  Filled 2017-07-02: qty 1

## 2017-07-02 MED ORDER — DIPHENHYDRAMINE HCL 50 MG/ML IJ SOLN
12.5000 mg | Freq: Once | INTRAMUSCULAR | Status: AC
  Administered 2017-07-02: 12.5 mg via INTRAVENOUS

## 2017-07-02 NOTE — Patient Instructions (Addendum)
Trastuzumab injection for infusion What is this medicine? TRASTUZUMAB (tras TOO zoo mab) is a monoclonal antibody. It is used to treat breast cancer and stomach cancer. This medicine may be used for other purposes; ask your health care provider or pharmacist if you have questions. COMMON BRAND NAME(S): Herceptin What should I tell my health care provider before I take this medicine? They need to know if you have any of these conditions: -heart disease -heart failure -lung or breathing disease, like asthma -an unusual or allergic reaction to trastuzumab, benzyl alcohol, or other medications, foods, dyes, or preservatives -pregnant or trying to get pregnant -breast-feeding How should I use this medicine? This drug is given as an infusion into a vein. It is administered in a hospital or clinic by a specially trained health care professional. Talk to your pediatrician regarding the use of this medicine in children. This medicine is not approved for use in children. Overdosage: If you think you have taken too much of this medicine contact a poison control center or emergency room at once. NOTE: This medicine is only for you. Do not share this medicine with others. What if I miss a dose? It is important not to miss a dose. Call your doctor or health care professional if you are unable to keep an appointment. What may interact with this medicine? This medicine may interact with the following medications: -certain types of chemotherapy, such as daunorubicin, doxorubicin, epirubicin, and idarubicin This list may not describe all possible interactions. Give your health care provider a list of all the medicines, herbs, non-prescription drugs, or dietary supplements you use. Also tell them if you smoke, drink alcohol, or use illegal drugs. Some items may interact with your medicine. What should I watch for while using this medicine? Visit your doctor for checks on your progress. Report any side effects.  Continue your course of treatment even though you feel ill unless your doctor tells you to stop. Call your doctor or health care professional for advice if you get a fever, chills or sore throat, or other symptoms of a cold or flu. Do not treat yourself. Try to avoid being around people who are sick. You may experience fever, chills and shaking during your first infusion. These effects are usually mild and can be treated with other medicines. Report any side effects during the infusion to your health care professional. Fever and chills usually do not happen with later infusions. Do not become pregnant while taking this medicine or for 7 months after stopping it. Women should inform their doctor if they wish to become pregnant or think they might be pregnant. Women of child-bearing potential will need to have a negative pregnancy test before starting this medicine. There is a potential for serious side effects to an unborn child. Talk to your health care professional or pharmacist for more information. Do not breast-feed an infant while taking this medicine or for 7 months after stopping it. Women must use effective birth control with this medicine. What side effects may I notice from receiving this medicine? Side effects that you should report to your doctor or health care professional as soon as possible: -allergic reactions like skin rash, itching or hives, swelling of the face, lips, or tongue -chest pain or palpitations -cough -dizziness -feeling faint or lightheaded, falls -fever -general ill feeling or flu-like symptoms -signs of worsening heart failure like breathing problems; swelling in your legs and feet -unusually weak or tired Side effects that usually do not require medical   attention (report to your doctor or health care professional if they continue or are bothersome): -bone pain -changes in taste -diarrhea -joint pain -nausea/vomiting -weight loss This list may not describe all  possible side effects. Call your doctor for medical advice about side effects. You may report side effects to FDA at 1-800-FDA-1088. Where should I keep my medicine? This drug is given in a hospital or clinic and will not be stored at home. NOTE: This sheet is a summary. It may not cover all possible information. If you have questions about this medicine, talk to your doctor, pharmacist, or health care provider.  2018 Elsevier/Gold Standard (2016-06-03 14:37:52)  

## 2017-07-02 NOTE — Progress Notes (Signed)
Hematology and Oncology Follow Up Visit  Michelle Bowen [id number] 12-May-1941 77 y.o. 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 She is doing well and has no complaints at this time.  She is wearing her back brace. She still has some lower back discomfort but this seems to be a little better.  She has occasional night sweats with Femara. These come and go and she feels that they are tolerable for now.  CA 27.29 was 19.6 3 weeks ago.  Bilateral breast exam today was unchanged. No lymphadenopathy noted.  No fever, chills, n/v, cough, rash, dizziness, SOB, chest pain, palpitations, abdominal pain or changes in bowel or bladder habits.  No episodes of bleeding or bruising.  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 No swelling and tenderness in her extremities.  She has maintained a good appetite and is staying well hydrated. Her weight is stable.   ECOG Performance Status: 1 - Symptomatic but completely ambulatory  Medications:  Allergies as of 07/02/2017      Reactions   Acetaminophen Hives      Medication List        Accurate as of 07/02/17  1:50 PM. Always use your most recent med list.          cyanocobalamin 500 MCG tablet Take 500 mcg by mouth daily.   gabapentin 300 MG capsule Commonly known as:  NEURONTIN Take 1 capsule (300 mg total) by mouth at bedtime.   letrozole 2.5 MG tablet Commonly known as:  FEMARA Take 1 tablet (2.5 mg total) by mouth daily.   Misc. Devices Misc For Bilateral hip pain   oxyCODONE 5 MG immediate release tablet Commonly known as:  Oxy IR/ROXICODONE Take 1 tablet (5 mg total) by mouth every 6 (six) hours as needed for severe pain.   PROBIOTIC ACIDOPHILUS PO Take by mouth.   triamterene-hydrochlorothiazide 37.5-25 MG tablet Commonly known as:  MAXZIDE-25 Take 1 tablet by mouth daily.   Vitamin D3 5000 units Tabs Take by mouth.       Allergies:  Allergies  Allergen Reactions  . Acetaminophen Hives    Past Medical History, Surgical history, Social history, and Family History were reviewed and updated.  Review of Systems: All other 10 point review of systems is negative.   Physical Exam:  vitals were not taken for this visit.   Wt Readings from Last 3 Encounters:  07/02/17 131 lb (59.4 kg)  06/11/17 130 lb 4 oz (59.1 kg)  04/30/17 129 lb (58.5 kg)    Ocular: Sclerae unicteric, pupils equal, round and reactive to light Ear-nose-throat: Oropharynx clear, dentition fair Lymphatic: No cervical, supraclavicular or axillary adenopathy Lungs no rales or rhonchi, good excursion bilaterally Heart regular rate and rhythm, no murmur appreciated Abd soft, nontender, positive bowel sounds, no liver or spleen tip palpated on exam, no fluid wave  MSK no focal spinal tenderness, no joint edema Neuro: non-focal, well-oriented, appropriate affect Breasts: No change on bilateral breast exam. No new mass of the right breast. No lesion or rash noted.   Lab Results  Component Value Date   WBC 3.2 (L) 06/11/2017   HGB 10.3 (L) 06/11/2017  HCT 31.8 (L) 07/02/2017   MCV 87.6 07/02/2017   PLT 264 06/11/2017   No results found for: FERRITIN, IRON, TIBC, UIBC, IRONPCTSAT Lab Results  Component Value Date   RBC 3.63 (L) 07/02/2017   No results found for: KPAFRELGTCHN, LAMBDASER, KAPLAMBRATIO No results found for: IGGSERUM, IGA, IGMSERUM No results found for: Odetta Pink, SPEI   Chemistry      Component Value Date/Time   NA 142 07/02/2017 1147   NA 147 (H) 06/11/2017 1345   NA 139 04/23/2017 1256   K 3.9 07/02/2017 1147   K 4.3 06/11/2017 1345   K 4.2 04/23/2017 1256   CL 102 07/02/2017 1147   CL  106 06/11/2017 1345   CO2 29 07/02/2017 1147   CO2 27 06/11/2017 1345   CO2 28 04/23/2017 1256   BUN 10 07/02/2017 1147   BUN 13 06/11/2017 1345   BUN 10.0 04/23/2017 1256   CREATININE 1.1 06/11/2017 1345   CREATININE 0.8 04/23/2017 1256      Component Value Date/Time   CALCIUM 8.4 07/02/2017 1147   CALCIUM 8.9 06/11/2017 1345   CALCIUM 8.7 04/23/2017 1256   ALKPHOS 76 07/02/2017 1147   ALKPHOS 85 (H) 06/11/2017 1345   ALKPHOS 104 04/23/2017 1256   AST 24 07/02/2017 1147   AST 15 04/23/2017 1256   ALT 14 07/02/2017 1147   ALT 17 06/11/2017 1345   ALT <6 04/23/2017 1256   BILITOT 0.8 07/02/2017 1147   BILITOT 0.59 04/23/2017 1256      Impression and Plan: Michelle Bowen is a very pleasant 77 yo African American female with metastatic breast cancer, triple positive. She is doing well on Herceptin and continues to tolerate daily Femara nicely.  We will proceed with treatment today as planned.  We will repeat CT scans and get a bone scan as well in the next 2-3 weeks.  Will plan to see her back in another 3 weeks for follow-up, lab and treatment.  She will contact our office with any questions or concerns. We can certainly see her sooner if need be.   Michelle Peace, NP 1/10/20191:50 PM

## 2017-07-03 LAB — VITAMIN D 25 HYDROXY (VIT D DEFICIENCY, FRACTURES): Vit D, 25-Hydroxy: 41.9 ng/mL (ref 30.0–100.0)

## 2017-07-03 LAB — CANCER ANTIGEN 27.29: CA 27.29: 19.3 U/mL (ref 0.0–38.6)

## 2017-07-09 ENCOUNTER — Encounter: Payer: Self-pay | Admitting: Hematology & Oncology

## 2017-07-17 ENCOUNTER — Ambulatory Visit (HOSPITAL_COMMUNITY)
Admission: RE | Admit: 2017-07-17 | Discharge: 2017-07-17 | Disposition: A | Discharge status: Home / Self Care | Payer: Medicare Other | Source: Ambulatory Visit | Attending: Family | Admitting: Family

## 2017-07-17 ENCOUNTER — Ambulatory Visit (HOSPITAL_COMMUNITY): Payer: Medicare Other

## 2017-07-17 DIAGNOSIS — R9389 Abnormal findings on diagnostic imaging of other specified body structures: Secondary | ICD-10-CM | POA: Insufficient documentation

## 2017-07-17 DIAGNOSIS — R911 Solitary pulmonary nodule: Secondary | ICD-10-CM | POA: Diagnosis not present

## 2017-07-17 DIAGNOSIS — R918 Other nonspecific abnormal finding of lung field: Secondary | ICD-10-CM | POA: Insufficient documentation

## 2017-07-17 DIAGNOSIS — Z9889 Other specified postprocedural states: Secondary | ICD-10-CM | POA: Diagnosis not present

## 2017-07-17 DIAGNOSIS — C50411 Malignant neoplasm of upper-outer quadrant of right female breast: Secondary | ICD-10-CM

## 2017-07-17 DIAGNOSIS — C7951 Secondary malignant neoplasm of bone: Secondary | ICD-10-CM | POA: Diagnosis not present

## 2017-07-17 DIAGNOSIS — Z923 Personal history of irradiation: Secondary | ICD-10-CM | POA: Insufficient documentation

## 2017-07-17 DIAGNOSIS — K7689 Other specified diseases of liver: Secondary | ICD-10-CM | POA: Diagnosis not present

## 2017-07-17 MED ORDER — IOPAMIDOL (ISOVUE-300) INJECTION 61%
INTRAVENOUS | Status: AC
  Filled 2017-07-17: qty 30

## 2017-07-17 MED ORDER — IOPAMIDOL (ISOVUE-300) INJECTION 61%
30.0000 mL | Freq: Once | INTRAVENOUS | Status: AC | PRN
  Administered 2017-07-17: 30 mL via ORAL

## 2017-07-17 MED ORDER — IOPAMIDOL (ISOVUE-300) INJECTION 61%
INTRAVENOUS | Status: AC
  Filled 2017-07-17: qty 100

## 2017-07-17 MED ORDER — IOPAMIDOL (ISOVUE-300) INJECTION 61%
100.0000 mL | Freq: Once | INTRAVENOUS | Status: AC | PRN
  Administered 2017-07-17: 100 mL via INTRAVENOUS

## 2017-07-17 MED ORDER — TECHNETIUM TC 99M MEDRONATE IV KIT
21.9000 | PACK | Freq: Once | INTRAVENOUS | Status: AC | PRN
  Administered 2017-07-17: 21.9 via INTRAVENOUS

## 2017-07-23 ENCOUNTER — Other Ambulatory Visit: Payer: Self-pay

## 2017-07-23 ENCOUNTER — Inpatient Hospital Stay: Payer: Medicare Other

## 2017-07-23 ENCOUNTER — Ambulatory Visit: Payer: Medicare Other

## 2017-07-23 ENCOUNTER — Encounter: Payer: Self-pay | Admitting: Hematology & Oncology

## 2017-07-23 ENCOUNTER — Other Ambulatory Visit: Payer: Medicare Other

## 2017-07-23 ENCOUNTER — Ambulatory Visit: Payer: Medicare Other | Admitting: Family

## 2017-07-23 ENCOUNTER — Inpatient Hospital Stay (HOSPITAL_BASED_OUTPATIENT_CLINIC_OR_DEPARTMENT_OTHER): Payer: Medicare Other | Admitting: Hematology & Oncology

## 2017-07-23 VITALS — BP 179/69 | HR 64 | Temp 98.4°F | Wt 134.0 lb

## 2017-07-23 DIAGNOSIS — C7951 Secondary malignant neoplasm of bone: Secondary | ICD-10-CM

## 2017-07-23 DIAGNOSIS — C50411 Malignant neoplasm of upper-outer quadrant of right female breast: Secondary | ICD-10-CM

## 2017-07-23 DIAGNOSIS — C50911 Malignant neoplasm of unspecified site of right female breast: Secondary | ICD-10-CM

## 2017-07-23 DIAGNOSIS — Z170 Estrogen receptor positive status [ER+]: Principal | ICD-10-CM

## 2017-07-23 DIAGNOSIS — Z5112 Encounter for antineoplastic immunotherapy: Secondary | ICD-10-CM | POA: Diagnosis not present

## 2017-07-23 DIAGNOSIS — E559 Vitamin D deficiency, unspecified: Secondary | ICD-10-CM

## 2017-07-23 LAB — CBC WITH DIFFERENTIAL (CANCER CENTER ONLY)
Basophils Absolute: 0 10*3/uL (ref 0.0–0.1)
Basophils Relative: 0 %
EOS ABS: 0.1 10*3/uL (ref 0.0–0.5)
Eosinophils Relative: 3 %
HEMATOCRIT: 32.2 % — AB (ref 34.8–46.6)
HEMOGLOBIN: 10 g/dL — AB (ref 11.6–15.9)
LYMPHS ABS: 0.8 10*3/uL — AB (ref 0.9–3.3)
Lymphocytes Relative: 27 %
MCH: 27.2 pg (ref 26.0–34.0)
MCHC: 31.1 g/dL — AB (ref 32.0–36.0)
MCV: 87.7 fL (ref 81.0–101.0)
MONOS PCT: 12 %
Monocytes Absolute: 0.3 10*3/uL (ref 0.1–0.9)
NEUTROS PCT: 58 %
Neutro Abs: 1.6 10*3/uL (ref 1.5–6.5)
Platelet Count: 255 10*3/uL (ref 145–400)
RBC: 3.67 MIL/uL — ABNORMAL LOW (ref 3.70–5.32)
RDW: 15.3 % (ref 11.1–15.7)
WBC Count: 2.8 10*3/uL — ABNORMAL LOW (ref 3.9–10.0)

## 2017-07-23 LAB — CMP (CANCER CENTER ONLY)
ALK PHOS: 73 U/L (ref 26–84)
ALT: 11 U/L (ref 0–55)
AST: 24 U/L (ref 5–34)
Albumin: 3.5 g/dL (ref 3.5–5.0)
Anion gap: 10 (ref 5–15)
BILIRUBIN TOTAL: 0.9 mg/dL (ref 0.2–1.2)
BUN: 12 mg/dL (ref 7–22)
CHLORIDE: 104 mmol/L (ref 98–108)
CO2: 29 mmol/L (ref 18–33)
Calcium: 8.4 mg/dL (ref 8.0–10.3)
Creatinine: 0.7 mg/dL (ref 0.60–1.10)
Glucose, Bld: 93 mg/dL (ref 73–118)
POTASSIUM: 3.8 mmol/L (ref 3.3–4.7)
SODIUM: 143 mmol/L (ref 128–145)
Total Protein: 7.9 g/dL (ref 6.4–8.1)

## 2017-07-23 MED ORDER — TRASTUZUMAB CHEMO 150 MG IV SOLR
6.0000 mg/kg | Freq: Once | INTRAVENOUS | Status: AC
  Administered 2017-07-23: 357 mg via INTRAVENOUS
  Filled 2017-07-23: qty 17

## 2017-07-23 MED ORDER — DIPHENHYDRAMINE HCL 50 MG/ML IJ SOLN
12.5000 mg | Freq: Once | INTRAMUSCULAR | Status: AC
  Administered 2017-07-23: 12.5 mg via INTRAVENOUS

## 2017-07-23 MED ORDER — HEPARIN SOD (PORK) LOCK FLUSH 100 UNIT/ML IV SOLN
500.0000 [IU] | Freq: Once | INTRAVENOUS | Status: AC | PRN
  Administered 2017-07-23: 500 [IU]
  Filled 2017-07-23: qty 5

## 2017-07-23 MED ORDER — SODIUM CHLORIDE 0.9 % IV SOLN
Freq: Once | INTRAVENOUS | Status: AC
  Administered 2017-07-23: 11:00:00 via INTRAVENOUS

## 2017-07-23 MED ORDER — DIPHENHYDRAMINE HCL 50 MG/ML IJ SOLN
INTRAMUSCULAR | Status: AC
  Filled 2017-07-23: qty 1

## 2017-07-23 MED ORDER — SODIUM CHLORIDE 0.9% FLUSH
10.0000 mL | INTRAVENOUS | Status: DC | PRN
  Administered 2017-07-23: 10 mL
  Filled 2017-07-23: qty 10

## 2017-07-23 MED ORDER — SODIUM CHLORIDE 0.9 % IV SOLN
20.0000 mg | Freq: Once | INTRAVENOUS | Status: AC
  Administered 2017-07-23: 20 mg via INTRAVENOUS
  Filled 2017-07-23: qty 2

## 2017-07-23 NOTE — Patient Instructions (Signed)
[facility] Discharge Instructions for Patients Receiving Chemotherapy  Today you received the following chemotherapy agents: Herceptin   To help prevent nausea and vomiting after your treatment, we encourage you to take your nausea medication as directed.    If you develop nausea and vomiting that is not controlled by your nausea medication, call the clinic.   BELOW ARE SYMPTOMS THAT SHOULD BE REPORTED IMMEDIATELY:  *FEVER GREATER THAN 100.5 F  *CHILLS WITH OR WITHOUT FEVER  NAUSEA AND VOMITING THAT IS NOT CONTROLLED WITH YOUR NAUSEA MEDICATION  *UNUSUAL SHORTNESS OF BREATH  *UNUSUAL BRUISING OR BLEEDING  TENDERNESS IN MOUTH AND THROAT WITH OR WITHOUT PRESENCE OF ULCERS  *URINARY PROBLEMS  *BOWEL PROBLEMS  UNUSUAL RASH Items with * indicate a potential emergency and should be followed up as soon as possible.  Feel free to call the clinic you have any questions or concerns. The clinic phone number is [phone].  Please show the CHEMO ALERT CARD at check-in to the Emergency Department and triage nurse.   

## 2017-07-23 NOTE — Progress Notes (Signed)
Hematology and Oncology Follow Up Visit  Michelle Bowen [id number] 1941/03/22 77 y.o. 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  She looks fairly good.  She feels okay.  She does have some chronic pain issues from her metastatic breast cancer.  We did go ahead and do a bone scan and CT scan on her.  These were done on January 25.  The bone scan showed metastatic disease in the thoracic and lumbar spine.  Unfortunately, we do not have anything that we really can compare to that is recent.  However, it does not appear to be anything that is worse.  Also noted was some uptake in the right issue him.  There appears to be some rib fracture over on the left mid chest that is healed.  Her CT scan of the body showed the bony lesions.  There is no hepatic metastasis.  She has some hepatic hemangiomas.  She has a 4 mm left lower lobe pulmonary nodule.  No some areas of radiation changes are noted in the lower right hemithorax.  Otherwise, nothing appears to be active.    Her last CA 27.29 was normal at 19.  She probably needs to have an echocardiogram done.  I did the last echo that we have recorded for her was back in October.  She has had no cough or shortness of breath.  There is been no bleeding.  She has had a good appetite.  She has had no nausea or vomiting.  She has had no problems with bowels or bladder.  She has had no leg swelling.  She has had no rashes.  Overall, her performance status is ECOG 1.      Medications:  Allergies as of 07/23/2017      Reactions   Acetaminophen Hives      Medication List        Accurate as of 07/23/17 10:43 AM. Always use your most recent med list.          cyanocobalamin 500 MCG  tablet Take 500 mcg by mouth daily.   gabapentin 300 MG capsule Commonly known as:  NEURONTIN Take 1 capsule (300 mg total) by mouth at bedtime.   letrozole 2.5 MG tablet Commonly known as:  FEMARA Take 1 tablet (2.5 mg total) by mouth daily.   Misc. Devices Misc For Bilateral hip pain   oxyCODONE 5 MG immediate release tablet Commonly known as:  Oxy IR/ROXICODONE Take 1 tablet (5 mg total) by mouth every 6 (six) hours as needed for severe pain.   PROBIOTIC ACIDOPHILUS PO Take by mouth.   triamterene-hydrochlorothiazide 37.5-25 MG tablet Commonly known as:  MAXZIDE-25 Take 1 tablet by mouth daily.   Vitamin D3 5000 units Tabs Take by mouth.       Allergies:  Allergies  Allergen Reactions  . Acetaminophen Hives    Past Medical History, Surgical history, Social history, and Family History were reviewed and updated.  Review of Systems: Review of Systems  Constitutional: Negative.   HENT: Negative.   Eyes: Negative.   Respiratory: Negative.   Cardiovascular: Negative.   Gastrointestinal: Negative.   Genitourinary: Negative.  Musculoskeletal: Positive for back pain.  Skin: Negative.   Neurological: Negative.   Endo/Heme/Allergies: Negative.   Psychiatric/Behavioral: Negative.     Physical Exam:  weight is 134 lb (60.8 kg). Her oral temperature is 98.4 F (36.9 C). Her blood pressure is 179/69 (abnormal) and her pulse is 64. Her oxygen saturation is 100%.   Wt Readings from Last 3 Encounters:  07/23/17 134 lb (60.8 kg)  07/02/17 131 lb (59.4 kg)  06/11/17 130 lb 4 oz (59.1 kg)    Physical Exam  Constitutional: She is oriented to person, place, and time.  HENT:  Head: Normocephalic and atraumatic.  Mouth/Throat: Oropharynx is clear and moist.  Eyes: EOM are normal. Pupils are equal, round, and reactive to light.  Neck: Normal range of motion.  Cardiovascular: Normal rate, regular rhythm and normal heart sounds.  Pulmonary/Chest: Effort normal and  breath sounds normal.  Abdominal: Soft. Bowel sounds are normal.  Musculoskeletal: Normal range of motion. She exhibits no edema, tenderness or deformity.  Lymphadenopathy:    She has no cervical adenopathy.  Neurological: She is alert and oriented to person, place, and time.  Skin: Skin is warm and dry. No rash noted. No erythema.  Psychiatric: She has a normal mood and affect. Her behavior is normal. Judgment and thought content normal.  Vitals reviewed.    Lab Results  Component Value Date   WBC 2.8 (L) 07/23/2017   HGB 10.3 (L) 06/11/2017   HCT 32.2 (L) 07/23/2017   MCV 87.7 07/23/2017   PLT 255 07/23/2017   No results found for: FERRITIN, IRON, TIBC, UIBC, IRONPCTSAT Lab Results  Component Value Date   RBC 3.67 (L) 07/23/2017   No results found for: KPAFRELGTCHN, LAMBDASER, KAPLAMBRATIO No results found for: IGGSERUM, IGA, IGMSERUM No results found for: Odetta Pink, SPEI   Chemistry      Component Value Date/Time   NA 143 07/23/2017 0950   NA 147 (H) 06/11/2017 1345   NA 139 04/23/2017 1256   K 3.8 07/23/2017 0950   K 4.3 06/11/2017 1345   K 4.2 04/23/2017 1256   CL 104 07/23/2017 0950   CL 106 06/11/2017 1345   CO2 29 07/23/2017 0950   CO2 27 06/11/2017 1345   CO2 28 04/23/2017 1256   BUN 12 07/23/2017 0950   BUN 13 06/11/2017 1345   BUN 10.0 04/23/2017 1256   CREATININE 1.1 06/11/2017 1345   CREATININE 0.8 04/23/2017 1256      Component Value Date/Time   CALCIUM 8.4 07/23/2017 0950   CALCIUM 8.9 06/11/2017 1345   CALCIUM 8.7 04/23/2017 1256   ALKPHOS 73 07/23/2017 0950   ALKPHOS 85 (H) 06/11/2017 1345   ALKPHOS 104 04/23/2017 1256   AST 24 07/23/2017 0950   AST 15 04/23/2017 1256   ALT 11 07/23/2017 0950   ALT 17 06/11/2017 1345   ALT <6 04/23/2017 1256   BILITOT 0.9 07/23/2017 0950   BILITOT 0.59 04/23/2017 1256      Impression and Plan: Michelle Bowen is a very pleasant 77 yo African American  female with metastatic breast cancer, triple positive. She is doing well on Herceptin and continues to tolerate daily Femara nicely.   At this point, I think we can just follow her CA 27.29.  If we start to see this going up, then I probably would repeat her scans and see how everything looks.  She does need to have an echocardiogram.  I have talked her about adding  Perjeta to the Herceptin.  She does not wish to do this.  She just wishes to follow the recommendations that were given to her from the [place].  I am okay with this.  If we do find that her CA 27.29 is going up, then we might be able to add Perjeta at that time.  We could always use Faslodex if we see that she is progressing.  Thankfully, she still has estrogen responsive cancer so we have options at our disposal.  I spent about 40 minutes with she and her friend.  Over 50% of the time this was face-to-face.  I went over her scans with her.  I reviewed her labs with her.  I answered all of their questions.  We will have her come back in 3 more weeks.  Volanda Napoleon, MD 1/31/201910:43 AM

## 2017-07-24 LAB — CANCER ANTIGEN 27.29: CAN 27.29: 17.6 U/mL (ref 0.0–38.6)

## 2017-07-24 LAB — VITAMIN D 25 HYDROXY (VIT D DEFICIENCY, FRACTURES): Vit D, 25-Hydroxy: 45.6 ng/mL (ref 30.0–100.0)

## 2017-08-13 ENCOUNTER — Encounter: Payer: Self-pay | Admitting: Hematology & Oncology

## 2017-08-13 ENCOUNTER — Other Ambulatory Visit: Payer: Self-pay | Admitting: *Deleted

## 2017-08-13 ENCOUNTER — Inpatient Hospital Stay: Payer: Medicare Other

## 2017-08-13 ENCOUNTER — Inpatient Hospital Stay: Payer: Medicare Other | Attending: Hematology & Oncology | Admitting: Hematology & Oncology

## 2017-08-13 ENCOUNTER — Other Ambulatory Visit: Payer: Self-pay

## 2017-08-13 VITALS — BP 202/73 | HR 107 | Temp 98.0°F | Resp 16 | Wt 132.2 lb

## 2017-08-13 DIAGNOSIS — C7951 Secondary malignant neoplasm of bone: Secondary | ICD-10-CM | POA: Insufficient documentation

## 2017-08-13 DIAGNOSIS — Z79899 Other long term (current) drug therapy: Secondary | ICD-10-CM | POA: Diagnosis not present

## 2017-08-13 DIAGNOSIS — I10 Essential (primary) hypertension: Secondary | ICD-10-CM | POA: Diagnosis not present

## 2017-08-13 DIAGNOSIS — C50911 Malignant neoplasm of unspecified site of right female breast: Secondary | ICD-10-CM | POA: Diagnosis present

## 2017-08-13 DIAGNOSIS — Z170 Estrogen receptor positive status [ER+]: Secondary | ICD-10-CM | POA: Insufficient documentation

## 2017-08-13 DIAGNOSIS — Z5112 Encounter for antineoplastic immunotherapy: Secondary | ICD-10-CM | POA: Diagnosis present

## 2017-08-13 DIAGNOSIS — C50411 Malignant neoplasm of upper-outer quadrant of right female breast: Secondary | ICD-10-CM

## 2017-08-13 LAB — CMP (CANCER CENTER ONLY)
ALBUMIN: 4 g/dL (ref 3.5–5.0)
ALT: 11 U/L (ref 10–47)
AST: 20 U/L (ref 11–38)
Alkaline Phosphatase: 65 U/L (ref 26–84)
Anion gap: 8 (ref 5–15)
BILIRUBIN TOTAL: 1.2 mg/dL (ref 0.2–1.6)
BUN: 18 mg/dL (ref 7–22)
CHLORIDE: 106 mmol/L (ref 98–108)
CO2: 28 mmol/L (ref 18–33)
CREATININE: 1 mg/dL (ref 0.60–1.20)
Calcium: 9.5 mg/dL (ref 8.0–10.3)
Glucose, Bld: 89 mg/dL (ref 73–118)
POTASSIUM: 3.5 mmol/L (ref 3.3–4.7)
Sodium: 142 mmol/L (ref 128–145)
Total Protein: 7.9 g/dL (ref 6.4–8.1)

## 2017-08-13 LAB — CBC WITH DIFFERENTIAL (CANCER CENTER ONLY)
BASOS PCT: 0 %
Basophils Absolute: 0 10*3/uL (ref 0.0–0.1)
EOS ABS: 0.1 10*3/uL (ref 0.0–0.5)
Eosinophils Relative: 2 %
HEMATOCRIT: 33.5 % — AB (ref 34.8–46.6)
Hemoglobin: 10.6 g/dL — ABNORMAL LOW (ref 11.6–15.9)
LYMPHS ABS: 0.9 10*3/uL (ref 0.9–3.3)
Lymphocytes Relative: 25 %
MCH: 27.7 pg (ref 26.0–34.0)
MCHC: 31.6 g/dL — AB (ref 32.0–36.0)
MCV: 87.5 fL (ref 81.0–101.0)
MONO ABS: 0.4 10*3/uL (ref 0.1–0.9)
MONOS PCT: 12 %
Neutro Abs: 2.2 10*3/uL (ref 1.5–6.5)
Neutrophils Relative %: 61 %
Platelet Count: 237 10*3/uL (ref 145–400)
RBC: 3.83 MIL/uL (ref 3.70–5.32)
RDW: 14.8 % (ref 11.1–15.7)
WBC Count: 3.6 10*3/uL — ABNORMAL LOW (ref 3.9–10.0)

## 2017-08-13 MED ORDER — SODIUM CHLORIDE 0.9% FLUSH
10.0000 mL | INTRAVENOUS | Status: DC | PRN
  Administered 2017-08-13: 10 mL
  Filled 2017-08-13: qty 10

## 2017-08-13 MED ORDER — SODIUM CHLORIDE 0.9 % IV SOLN
6.0000 mg/kg | Freq: Once | INTRAVENOUS | Status: AC
  Administered 2017-08-13: 357 mg via INTRAVENOUS
  Filled 2017-08-13: qty 17

## 2017-08-13 MED ORDER — OXYCODONE HCL 5 MG PO TABS: 5.0000 mg | ORAL_TABLET | Freq: Four times a day (QID) | ORAL | 0 refills | Status: DC | PRN

## 2017-08-13 MED ORDER — HEPARIN SOD (PORK) LOCK FLUSH 100 UNIT/ML IV SOLN
500.0000 [IU] | Freq: Once | INTRAVENOUS | Status: AC | PRN
  Administered 2017-08-13: 500 [IU]
  Filled 2017-08-13: qty 5

## 2017-08-13 MED ORDER — SODIUM CHLORIDE 0.9 % IV SOLN
Freq: Once | INTRAVENOUS | Status: AC
  Administered 2017-08-13: 14:00:00 via INTRAVENOUS

## 2017-08-13 MED ORDER — GABAPENTIN 300 MG PO CAPS: 300.0000 mg | ORAL_CAPSULE | Freq: Every day | ORAL | 2 refills | Status: DC

## 2017-08-13 MED ORDER — DEXAMETHASONE SODIUM PHOSPHATE 100 MG/10ML IJ SOLN
20.0000 mg | Freq: Once | INTRAMUSCULAR | Status: AC
  Administered 2017-08-13: 20 mg via INTRAVENOUS
  Filled 2017-08-13: qty 2

## 2017-08-13 MED ORDER — LETROZOLE 2.5 MG PO TABS: 2.5000 mg | ORAL_TABLET | Freq: Every day | ORAL | 3 refills | Status: DC

## 2017-08-13 MED ORDER — GABAPENTIN 300 MG PO CAPS: 300.0000 mg | ORAL_CAPSULE | Freq: Every day | ORAL | 12 refills | Status: DC

## 2017-08-13 MED ORDER — DEXAMETHASONE SODIUM PHOSPHATE 10 MG/ML IJ SOLN
INTRAMUSCULAR | Status: AC
  Filled 2017-08-13: qty 1

## 2017-08-13 MED ORDER — DIPHENHYDRAMINE HCL 50 MG/ML IJ SOLN
INTRAMUSCULAR | Status: AC
  Filled 2017-08-13: qty 1

## 2017-08-13 MED ORDER — AMLODIPINE BESY-BENAZEPRIL HCL 5-20 MG PO CAPS: 1.0000 | ORAL_CAPSULE | Freq: Every day | ORAL | 12 refills | Status: DC

## 2017-08-13 MED ORDER — DIPHENHYDRAMINE HCL 50 MG/ML IJ SOLN
12.5000 mg | Freq: Once | INTRAMUSCULAR | Status: AC
Start: 2017-08-13 — End: 2017-08-13
  Administered 2017-08-13: 12.5 mg via INTRAVENOUS

## 2017-08-13 NOTE — Progress Notes (Signed)
Hematology and Oncology Follow Up Visit  Michelle Bowen [id number] 14-Apr-1941 77 y.o. 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  She seems to be doing pretty well.  Unfortunately, her blood pressure is horrible.  It was 203/70.  I rechecked it myself.  It was 190/80.  She says she is taking Maxide.  We will have to change this.  She sees her family doctor not for another few months.  Since she sees [place] every 3 weeks, I think it would be okay for [place] to make a change in her blood pressure medicine.  Her last CA 27.29 was normal at 18.  Thankfully, this is holding steady.  She probably needs to have an echocardiogram done.  I did the last echo that we have recorded for her was back in October.  She has had no cough or shortness of breath.  There is been no bleeding.  She has had a good appetite.  She has had no nausea or vomiting.  She has had no problems with bowels or bladder.  She has had no leg swelling.  She has had no rashes.  Overall, her performance status is ECOG 1.      Medications:  Allergies as of 08/13/2017      Reactions   Acetaminophen Hives      Medication List        Accurate as of 08/13/17 12:53 PM. Always use your most recent med list.          cyanocobalamin 500 MCG tablet Take 500 mcg by mouth daily.   gabapentin 300 MG capsule Commonly known as:  NEURONTIN Take 1 capsule (300 mg total) by mouth at bedtime.   letrozole 2.5 MG tablet Commonly known as:  FEMARA Take 1 tablet (2.5 mg total) by mouth daily.   Misc. Devices Misc For Bilateral hip pain   oxyCODONE 5 MG immediate release tablet Commonly known as:  Oxy IR/ROXICODONE Take 1 tablet (5 mg total) by mouth every 6 (six) hours as needed for  severe pain.   PROBIOTIC ACIDOPHILUS PO Take by mouth.   triamterene-hydrochlorothiazide 37.5-25 MG tablet Commonly known as:  MAXZIDE-25 Take 1 tablet by mouth daily.   Vitamin D3 5000 units Tabs Take by mouth.       Allergies:  Allergies  Allergen Reactions  . Acetaminophen Hives    Past Medical History, Surgical history, Social history, and Family History were reviewed and updated.  Review of Systems: Review of Systems  Constitutional: Negative.   HENT: Negative.   Eyes: Negative.   Respiratory: Negative.   Cardiovascular: Negative.   Gastrointestinal: Negative.   Genitourinary: Negative.   Musculoskeletal: Positive for back pain.  Skin: Negative.   Neurological: Negative.   Endo/Heme/Allergies: Negative.   Psychiatric/Behavioral: Negative.     Physical Exam:  vitals were not taken for this visit.   Wt Readings from Last 3 Encounters:  07/23/17 134 lb (60.8 kg)  07/02/17 131 lb (59.4 kg)  06/11/17 130 lb 4 oz (59.1 kg)    Physical Exam  Constitutional: She is oriented to person, place, and time.  HENT:  Head: Normocephalic and atraumatic.  Mouth/Throat: Oropharynx is clear  and moist.  Eyes: EOM are normal. Pupils are equal, round, and reactive to light.  Neck: Normal range of motion.  Cardiovascular: Normal rate, regular rhythm and normal heart sounds.  Pulmonary/Chest: Effort normal and breath sounds normal.  Abdominal: Soft. Bowel sounds are normal.  Musculoskeletal: Normal range of motion. She exhibits no edema, tenderness or deformity.  Lymphadenopathy:    She has no cervical adenopathy.  Neurological: She is alert and oriented to person, place, and time.  Skin: Skin is warm and dry. No rash noted. No erythema.  Psychiatric: She has a normal mood and affect. Her behavior is normal. Judgment and thought content normal.  Vitals reviewed.    Lab Results  Component Value Date   WBC 3.6 (L) 08/13/2017   HGB 10.3 (L) 06/11/2017   HCT 33.5 (L)  08/13/2017   MCV 87.5 08/13/2017   PLT 237 08/13/2017   No results found for: FERRITIN, IRON, TIBC, UIBC, IRONPCTSAT Lab Results  Component Value Date   RBC 3.83 08/13/2017   No results found for: KPAFRELGTCHN, LAMBDASER, KAPLAMBRATIO No results found for: IGGSERUM, IGA, IGMSERUM No results found for: Odetta Pink, SPEI   Chemistry      Component Value Date/Time   NA 142 08/13/2017 1145   NA 147 (H) 06/11/2017 1345   NA 139 04/23/2017 1256   K 3.5 08/13/2017 1145   K 4.3 06/11/2017 1345   K 4.2 04/23/2017 1256   CL 106 08/13/2017 1145   CL 106 06/11/2017 1345   CO2 28 08/13/2017 1145   CO2 27 06/11/2017 1345   CO2 28 04/23/2017 1256   BUN 18 08/13/2017 1145   BUN 13 06/11/2017 1345   BUN 10.0 04/23/2017 1256   CREATININE 1.00 08/13/2017 1145   CREATININE 1.1 06/11/2017 1345   CREATININE 0.8 04/23/2017 1256      Component Value Date/Time   CALCIUM 9.5 08/13/2017 1145   CALCIUM 8.9 06/11/2017 1345   CALCIUM 8.7 04/23/2017 1256   ALKPHOS 65 08/13/2017 1145   ALKPHOS 85 (H) 06/11/2017 1345   ALKPHOS 104 04/23/2017 1256   AST 20 08/13/2017 1145   AST 15 04/23/2017 1256   ALT 11 08/13/2017 1145   ALT 17 06/11/2017 1345   ALT <6 04/23/2017 1256   BILITOT 1.2 08/13/2017 1145   BILITOT 0.59 04/23/2017 1256      Impression and Plan: Michelle Bowen is a very pleasant 77 yo African American female with metastatic breast cancer, triple positive. She is doing well on Herceptin and continues to tolerate daily Femara nicely.   Everything is looking pretty good.  Her quality of life is doing well.  I just do not want to see her have a stroke.  Hopefully, the Lotrel will help.  We will get an echocardiogram on her.  We really need to get this so we can measure her cardiac function since she is on Herceptin.  I spent about 25 minutes with her.  Over 50% of the time was spent face-to-face with her.  I had a recheck her blood  pressure.  I talked about her hypertension and how this is about to be her issue that her breast cancer right now.     Volanda Napoleon, MD 2/21/201912:53 PM

## 2017-08-13 NOTE — Patient Instructions (Signed)
Implanted Port Insertion, Care After °This sheet gives you information about how to care for yourself after your procedure. Your health care provider may also give you more specific instructions. If you have problems or questions, contact your health care provider. °What can I expect after the procedure? °After your procedure, it is common to have: °· Discomfort at the port insertion site. °· Bruising on the skin over the port. This should improve over 3-4 days. ° °Follow these instructions at home: °Port care °· After your port is placed, you will get a manufacturer's information card. The card has information about your port. Keep this card with you at all times. °· Take care of the port as told by your health care provider. Ask your health care provider if you or a family member can get training for taking care of the port at home. A home health care nurse may also take care of the port. °· Make sure to remember what type of port you have. °Incision care °· Follow instructions from your health care provider about how to take care of your port insertion site. Make sure you: °? Wash your hands with soap and water before you change your bandage (dressing). If soap and water are not available, use hand sanitizer. °? Change your dressing as told by your health care provider. °? Leave stitches (sutures), skin glue, or adhesive strips in place. These skin closures may need to stay in place for 2 weeks or longer. If adhesive strip edges start to loosen and curl up, you may trim the loose edges. Do not remove adhesive strips completely unless your health care provider tells you to do that. °· Check your port insertion site every day for signs of infection. Check for: °? More redness, swelling, or pain. °? More fluid or blood. °? Warmth. °? Pus or a bad smell. °General instructions °· Do not take baths, swim, or use a hot tub until your health care provider approves. °· Do not lift anything that is heavier than 10 lb (4.5  kg) for a week, or as told by your health care provider. °· Ask your health care provider when it is okay to: °? Return to work or school. °? Resume usual physical activities or sports. °· Do not drive for 24 hours if you were given a medicine to help you relax (sedative). °· Take over-the-counter and prescription medicines only as told by your health care provider. °· Wear a medical alert bracelet in case of an emergency. This will tell any health care providers that you have a port. °· Keep all follow-up visits as told by your health care provider. This is important. °Contact a health care provider if: °· You cannot flush your port with saline as directed, or you cannot draw blood from the port. °· You have a fever or chills. °· You have more redness, swelling, or pain around your port insertion site. °· You have more fluid or blood coming from your port insertion site. °· Your port insertion site feels warm to the touch. °· You have pus or a bad smell coming from the port insertion site. °Get help right away if: °· You have chest pain or shortness of breath. °· You have bleeding from your port that you cannot control. °Summary °· Take care of the port as told by your health care provider. °· Change your dressing as told by your health care provider. °· Keep all follow-up visits as told by your health care provider. °  This information is not intended to replace advice given to you by your health care provider. Make sure you discuss any questions you have with your health care provider. °Document Released: 03/30/2013 Document Revised: 04/30/2016 Document Reviewed: 04/30/2016 °Elsevier Interactive Patient Education © 2017 [employer] ° °

## 2017-08-14 LAB — CANCER ANTIGEN 27.29: CA 27.29: 18.4 U/mL (ref 0.0–38.6)

## 2017-08-21 ENCOUNTER — Ambulatory Visit (HOSPITAL_COMMUNITY)
Admission: RE | Admit: 2017-08-21 | Discharge: 2017-08-21 | Disposition: A | Discharge status: Home / Self Care | Payer: Medicare Other | Source: Ambulatory Visit | Attending: Hematology & Oncology | Admitting: Hematology & Oncology

## 2017-08-21 DIAGNOSIS — Z170 Estrogen receptor positive status [ER+]: Secondary | ICD-10-CM

## 2017-08-21 DIAGNOSIS — I119 Hypertensive heart disease without heart failure: Secondary | ICD-10-CM | POA: Diagnosis not present

## 2017-08-21 DIAGNOSIS — C50411 Malignant neoplasm of upper-outer quadrant of right female breast: Secondary | ICD-10-CM

## 2017-08-21 NOTE — Progress Notes (Signed)
  Echocardiogram 2D Echocardiogram has been performed.  Michelle Bowen 08/21/2017, 11:58 AM

## 2017-08-24 ENCOUNTER — Telehealth: Payer: Self-pay | Admitting: *Deleted

## 2017-08-24 NOTE — Telephone Encounter (Addendum)
Patient is aware of results  ----- Message from Volanda Napoleon, MD sent at 08/22/2017 11:14 AM EST ----- Call - heart is pumping very nicely!!  Laurey Arrow

## 2017-09-03 ENCOUNTER — Inpatient Hospital Stay: Payer: Medicare Other

## 2017-09-03 ENCOUNTER — Encounter: Payer: Self-pay | Admitting: Family

## 2017-09-03 ENCOUNTER — Other Ambulatory Visit: Payer: Self-pay

## 2017-09-03 ENCOUNTER — Inpatient Hospital Stay: Payer: Medicare Other | Attending: Hematology & Oncology | Admitting: Family

## 2017-09-03 VITALS — BP 181/84 | HR 66 | Temp 98.1°F | Resp 18 | Wt 136.2 lb

## 2017-09-03 DIAGNOSIS — C50411 Malignant neoplasm of upper-outer quadrant of right female breast: Secondary | ICD-10-CM

## 2017-09-03 DIAGNOSIS — Z5112 Encounter for antineoplastic immunotherapy: Secondary | ICD-10-CM | POA: Diagnosis present

## 2017-09-03 DIAGNOSIS — C7951 Secondary malignant neoplasm of bone: Secondary | ICD-10-CM

## 2017-09-03 DIAGNOSIS — Z170 Estrogen receptor positive status [ER+]: Secondary | ICD-10-CM | POA: Diagnosis not present

## 2017-09-03 DIAGNOSIS — I10 Essential (primary) hypertension: Secondary | ICD-10-CM | POA: Insufficient documentation

## 2017-09-03 DIAGNOSIS — C50911 Malignant neoplasm of unspecified site of right female breast: Secondary | ICD-10-CM

## 2017-09-03 LAB — CMP (CANCER CENTER ONLY)
ALK PHOS: 61 U/L (ref 26–84)
ALT: 11 U/L (ref 10–47)
ANION GAP: 10 (ref 5–15)
AST: 21 U/L (ref 11–38)
Albumin: 4 g/dL (ref 3.5–5.0)
BUN: 13 mg/dL (ref 7–22)
CHLORIDE: 102 mmol/L (ref 98–108)
CO2: 30 mmol/L (ref 18–33)
Calcium: 9 mg/dL (ref 8.0–10.3)
Creatinine: 0.9 mg/dL (ref 0.60–1.20)
Glucose, Bld: 83 mg/dL (ref 73–118)
POTASSIUM: 3.6 mmol/L (ref 3.3–4.7)
SODIUM: 142 mmol/L (ref 128–145)
TOTAL PROTEIN: 7.7 g/dL (ref 6.4–8.1)
Total Bilirubin: 1 mg/dL (ref 0.2–1.6)

## 2017-09-03 LAB — CBC WITH DIFFERENTIAL (CANCER CENTER ONLY)
Basophils Absolute: 0 10*3/uL (ref 0.0–0.1)
Basophils Relative: 0 %
Eosinophils Absolute: 0.1 10*3/uL (ref 0.0–0.5)
Eosinophils Relative: 2 %
HCT: 32.4 % — ABNORMAL LOW (ref 34.8–46.6)
Hemoglobin: 10.3 g/dL — ABNORMAL LOW (ref 11.6–15.9)
LYMPHS ABS: 0.9 10*3/uL (ref 0.9–3.3)
LYMPHS PCT: 27 %
MCH: 28.1 pg (ref 26.0–34.0)
MCHC: 31.8 g/dL — AB (ref 32.0–36.0)
MCV: 88.5 fL (ref 81.0–101.0)
MONOS PCT: 13 %
Monocytes Absolute: 0.4 10*3/uL (ref 0.1–0.9)
Neutro Abs: 1.9 10*3/uL (ref 1.5–6.5)
Neutrophils Relative %: 58 %
PLATELETS: 236 10*3/uL (ref 145–400)
RBC: 3.66 MIL/uL — AB (ref 3.70–5.32)
RDW: 14.6 % (ref 11.1–15.7)
WBC: 3.3 10*3/uL — AB (ref 3.9–10.0)

## 2017-09-03 MED ORDER — DIPHENHYDRAMINE HCL 50 MG/ML IJ SOLN
INTRAMUSCULAR | Status: AC
  Filled 2017-09-03: qty 1

## 2017-09-03 MED ORDER — SODIUM CHLORIDE 0.9 % IJ SOLN
10.0000 mL | Freq: Once | INTRAMUSCULAR | Status: AC
  Administered 2017-09-03: 10 mL
  Filled 2017-09-03: qty 10

## 2017-09-03 MED ORDER — HEPARIN SOD (PORK) LOCK FLUSH 100 UNIT/ML IV SOLN
500.0000 [IU] | Freq: Once | INTRAVENOUS | Status: AC | PRN
  Administered 2017-09-03: 500 [IU]
  Filled 2017-09-03: qty 5

## 2017-09-03 MED ORDER — SODIUM CHLORIDE 0.9% FLUSH
10.0000 mL | INTRAVENOUS | Status: DC | PRN
  Administered 2017-09-03: 10 mL
  Filled 2017-09-03: qty 10

## 2017-09-03 MED ORDER — SODIUM CHLORIDE 0.9 % IV SOLN
Freq: Once | INTRAVENOUS | Status: AC
  Administered 2017-09-03: 14:00:00 via INTRAVENOUS

## 2017-09-03 MED ORDER — SODIUM CHLORIDE 0.9 % IV SOLN
20.0000 mg | Freq: Once | INTRAVENOUS | Status: AC
  Administered 2017-09-03: 20 mg via INTRAVENOUS
  Filled 2017-09-03: qty 2

## 2017-09-03 MED ORDER — DIPHENHYDRAMINE HCL 50 MG/ML IJ SOLN
12.5000 mg | Freq: Once | INTRAMUSCULAR | Status: AC
  Administered 2017-09-03: 12.5 mg via INTRAVENOUS

## 2017-09-03 MED ORDER — DENOSUMAB 120 MG/1.7ML ~~LOC~~ SOLN
120.0000 mg | Freq: Once | SUBCUTANEOUS | Status: AC
  Administered 2017-09-03: 120 mg via SUBCUTANEOUS

## 2017-09-03 MED ORDER — TRASTUZUMAB CHEMO 150 MG IV SOLR
6.0000 mg/kg | Freq: Once | INTRAVENOUS | Status: AC
  Administered 2017-09-03: 357 mg via INTRAVENOUS
  Filled 2017-09-03: qty 17

## 2017-09-03 MED ORDER — DENOSUMAB 120 MG/1.7ML ~~LOC~~ SOLN
SUBCUTANEOUS | Status: AC
  Filled 2017-09-03: qty 1.7

## 2017-09-03 NOTE — Patient Instructions (Signed)
Implanted Port Home Guide An implanted port is a type of central line that is placed under the skin. Central lines are used to provide IV access when treatment or nutrition needs to be given through a person's veins. Implanted ports are used for long-term IV access. An implanted port may be placed because:  You need IV medicine that would be irritating to the small veins in your hands or arms.  You need long-term IV medicines, such as antibiotics.  You need IV nutrition for a long period.  You need frequent blood draws for lab tests.  You need dialysis.  Implanted ports are usually placed in the chest area, but they can also be placed in the upper arm, the abdomen, or the leg. An implanted port has two main parts:  Reservoir. The reservoir is round and will appear as a small, raised area under your skin. The reservoir is the part where a needle is inserted to give medicines or draw blood.  Catheter. The catheter is a thin, flexible tube that extends from the reservoir. The catheter is placed into a large vein. Medicine that is inserted into the reservoir goes into the catheter and then into the vein.  How will I care for my incision site? Do not get the incision site wet. Bathe or shower as directed by your health care provider. How is my port accessed? Special steps must be taken to access the port:  Before the port is accessed, a numbing cream can be placed on the skin. This helps numb the skin over the port site.  Your health care provider uses a sterile technique to access the port. ? Your health care provider must put on a mask and sterile gloves. ? The skin over your port is cleaned carefully with an antiseptic and allowed to dry. ? The port is gently pinched between sterile gloves, and a needle is inserted into the port.  Only "non-coring" port needles should be used to access the port. Once the port is accessed, a blood return should be checked. This helps ensure that the port  is in the vein and is not clogged.  If your port needs to remain accessed for a constant infusion, a clear (transparent) bandage will be placed over the needle site. The bandage and needle will need to be changed every week, or as directed by your health care provider.  Keep the bandage covering the needle clean and dry. Do not get it wet. Follow your health care provider's instructions on how to take a shower or bath while the port is accessed.  If your port does not need to stay accessed, no bandage is needed over the port.  What is flushing? Flushing helps keep the port from getting clogged. Follow your health care provider's instructions on how and when to flush the port. Ports are usually flushed with saline solution or a medicine called heparin. The need for flushing will depend on how the port is used.  If the port is used for intermittent medicines or blood draws, the port will need to be flushed: ? After medicines have been given. ? After blood has been drawn. ? As part of routine maintenance.  If a constant infusion is running, the port may not need to be flushed.  How long will my port stay implanted? The port can stay in for as long as your health care provider thinks it is needed. When it is time for the port to come out, surgery will be   done to remove it. The procedure is similar to the one performed when the port was put in. When should I seek immediate medical care? When you have an implanted port, you should seek immediate medical care if:  You notice a bad smell coming from the incision site.  You have swelling, redness, or drainage at the incision site.  You have more swelling or pain at the port site or the surrounding area.  You have a fever that is not controlled with medicine.  This information is not intended to replace advice given to you by your health care provider. Make sure you discuss any questions you have with your health care provider. Document  Released: 06/09/2005 Document Revised: 11/15/2015 Document Reviewed: 02/14/2013 Elsevier Interactive Patient Education  2017 [employer]  

## 2017-09-03 NOTE — Progress Notes (Signed)
Hematology and Oncology Follow Up Visit  Michelle Bowen [id number] 02/25/1941 77 y.o. 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 She is doing well but does have some fatigue and will take a nap as needed.  She had her ECHO earlier this month and EF was 60-65%.  CA 27.29 was 18.4 last month.  She has occasional hot flash/night sweats but not often. She describes these as mild and tolerable so far.  She tried taking Lotrel for her HTN for one day. She states that she developed "heart flutters" and palpitations so she stopped it. She restarted her Maxzide. She does not have a PCP so we discussed her trying [facility] downstairs. I gave her their contact information and she states she will call and make and appointment.  No fever, chills, n/v, cough, rash, dizziness, SOB, chest pain, palpitations or changes in bowel or bladder habits.  She has intermittent twinges of pain in her left side that radiate towards the abdomen. She states that this is unchanged from her baseline after a previous back surgery.  No swelling in her extremities. 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  She uses a cane when ambulating for support. She has had no falls or syncopal episodes.  No episodes of bleeding, no bruising or petechiae. No lymphadenopathy found on exam.  She has maintained a good appetite and is staying well hydrated. Her weight is stable.   ECOG Performance Status: 1 - Symptomatic but completely ambulatory  Medications:  Allergies as of 09/03/2017      Reactions   Acetaminophen Hives      Medication List        Accurate as of 09/03/17  1:44 PM. Always use your most recent med list.            amLODipine-benazepril 5-20 MG capsule Commonly known as:  LOTREL Take 1 capsule by mouth daily.   cyanocobalamin 500 MCG tablet Take 500 mcg by mouth daily.   gabapentin 300 MG capsule Commonly known as:  NEURONTIN Take 1 capsule (300 mg total) by mouth at bedtime.   letrozole 2.5 MG tablet Commonly known as:  FEMARA Take 1 tablet (2.5 mg total) by mouth daily.   Misc. Devices Misc For Bilateral hip pain   oxyCODONE 5 MG immediate release tablet Commonly known as:  Oxy IR/ROXICODONE Take 1 tablet (5 mg total) by mouth every 6 (six) hours as needed for severe pain.   PROBIOTIC ACIDOPHILUS PO Take by mouth.   triamterene-hydrochlorothiazide 37.5-25 MG capsule Commonly known as:  DYAZIDE Take 1 capsule by mouth daily.   Vitamin D3 5000 units Tabs Take by mouth.       Allergies:  Allergies  Allergen Reactions  . Acetaminophen Hives    Past Medical History, Surgical history, Social history, and Family History were reviewed and updated.  Review of Systems: All other 10 point review of systems is negative.   Physical Exam:  weight is 136 lb 4 oz (61.8 kg). Her oral temperature is 98.1 F (36.7 C). Her blood pressure is 181/84 (abnormal) and her pulse is 66. Her respiration is 18 and oxygen  saturation is 100%.   Wt Readings from Last 3 Encounters:  09/03/17 136 lb 4 oz (61.8 kg)  08/13/17 132 lb 4 oz (60 kg)  07/23/17 134 lb (60.8 kg)    Ocular: Sclerae unicteric, pupils equal, round and reactive to light Ear-nose-throat: Oropharynx clear, dentition fair Lymphatic: No cervical, supraclavicular or axillary adenopathy Lungs no rales or rhonchi, good excursion bilaterally Heart regular rate and rhythm, no murmur appreciated Abd soft, nontender, positive bowel sounds, no liver or spleen tip palpated on exam, no fluid wave  MSK no focal spinal tenderness, no joint edema Neuro: non-focal, well-oriented, appropriate affect Breasts: No change with today's breast  exam. No new mass, no lesion or rash noted. No discharge or tenderness.   Lab Results  Component Value Date   WBC 3.3 (L) 09/03/2017   HGB 10.3 (L) 06/11/2017   HCT 32.4 (L) 09/03/2017   MCV 88.5 09/03/2017   PLT 236 09/03/2017   No results found for: FERRITIN, IRON, TIBC, UIBC, IRONPCTSAT Lab Results  Component Value Date   RBC 3.66 (L) 09/03/2017   No results found for: KPAFRELGTCHN, LAMBDASER, KAPLAMBRATIO No results found for: IGGSERUM, IGA, IGMSERUM No results found for: Odetta Pink, SPEI   Chemistry      Component Value Date/Time   NA 142 08/13/2017 1145   NA 147 (H) 06/11/2017 1345   NA 139 04/23/2017 1256   K 3.5 08/13/2017 1145   K 4.3 06/11/2017 1345   K 4.2 04/23/2017 1256   CL 106 08/13/2017 1145   CL 106 06/11/2017 1345   CO2 28 08/13/2017 1145   CO2 27 06/11/2017 1345   CO2 28 04/23/2017 1256   BUN 18 08/13/2017 1145   BUN 13 06/11/2017 1345   BUN 10.0 04/23/2017 1256   CREATININE 1.00 08/13/2017 1145   CREATININE 1.1 06/11/2017 1345   CREATININE 0.8 04/23/2017 1256      Component Value Date/Time   CALCIUM 9.5 08/13/2017 1145   CALCIUM 8.9 06/11/2017 1345   CALCIUM 8.7 04/23/2017 1256   ALKPHOS 65 08/13/2017 1145   ALKPHOS 85 (H) 06/11/2017 1345   ALKPHOS 104 04/23/2017 1256   AST 20 08/13/2017 1145   AST 15 04/23/2017 1256   ALT 11 08/13/2017 1145   ALT 17 06/11/2017 1345   ALT <6 04/23/2017 1256   BILITOT 1.2 08/13/2017 1145   BILITOT 0.59 04/23/2017 1256      Impression and Plan: Michelle Bowen is a very pleasant 77 yo African American female with metastatic breast cancer, triple positive. She is doing well but is having some mild fatigue at times.  She is tolerating Herceptin and Femara nicely.CA 27.29 from today is pending.  Her ECHO a little over a week ago showed an EF of 60-65%.   We will proceed with treatment today as planned.  She states that she will follow-up [facility] primary care  for new patient appointment and management of her HTN.   We will plan to see her back in another 3 weeks for follow-up, lab and infusion.  She will contact our office with any questions or concerns. We can certainly see him sooner if need be.   Laverna Peace, NP 3/14/20191:44 PM

## 2017-09-04 LAB — CANCER ANTIGEN 27.29: CA 27.29: 23 U/mL (ref 0.0–38.6)

## 2017-09-24 ENCOUNTER — Other Ambulatory Visit: Payer: Self-pay

## 2017-09-24 ENCOUNTER — Inpatient Hospital Stay: Payer: Medicare Other

## 2017-09-24 ENCOUNTER — Inpatient Hospital Stay: Payer: Medicare Other | Attending: Hematology & Oncology | Admitting: Hematology & Oncology

## 2017-09-24 ENCOUNTER — Encounter: Payer: Self-pay | Admitting: Hematology & Oncology

## 2017-09-24 VITALS — BP 182/81 | HR 74 | Temp 98.1°F | Resp 17 | Wt 136.0 lb

## 2017-09-24 DIAGNOSIS — C7951 Secondary malignant neoplasm of bone: Secondary | ICD-10-CM

## 2017-09-24 DIAGNOSIS — Z79811 Long term (current) use of aromatase inhibitors: Secondary | ICD-10-CM | POA: Insufficient documentation

## 2017-09-24 DIAGNOSIS — C50411 Malignant neoplasm of upper-outer quadrant of right female breast: Secondary | ICD-10-CM

## 2017-09-24 DIAGNOSIS — Z5112 Encounter for antineoplastic immunotherapy: Secondary | ICD-10-CM | POA: Diagnosis present

## 2017-09-24 DIAGNOSIS — C50911 Malignant neoplasm of unspecified site of right female breast: Secondary | ICD-10-CM | POA: Diagnosis present

## 2017-09-24 DIAGNOSIS — Z170 Estrogen receptor positive status [ER+]: Principal | ICD-10-CM

## 2017-09-24 LAB — CBC WITH DIFFERENTIAL (CANCER CENTER ONLY)
BASOS PCT: 0 %
Basophils Absolute: 0 10*3/uL (ref 0.0–0.1)
EOS ABS: 0.1 10*3/uL (ref 0.0–0.5)
EOS PCT: 2 %
HCT: 32.6 % — ABNORMAL LOW (ref 34.8–46.6)
Hemoglobin: 10.5 g/dL — ABNORMAL LOW (ref 11.6–15.9)
LYMPHS ABS: 1 10*3/uL (ref 0.9–3.3)
Lymphocytes Relative: 32 %
MCH: 28.1 pg (ref 26.0–34.0)
MCHC: 32.2 g/dL (ref 32.0–36.0)
MCV: 87.2 fL (ref 81.0–101.0)
MONOS PCT: 14 %
Monocytes Absolute: 0.4 10*3/uL (ref 0.1–0.9)
Neutro Abs: 1.6 10*3/uL (ref 1.5–6.5)
Neutrophils Relative %: 52 %
PLATELETS: 248 10*3/uL (ref 145–400)
RBC: 3.74 MIL/uL (ref 3.70–5.32)
RDW: 14.1 % (ref 11.1–15.7)
WBC: 3 10*3/uL — AB (ref 3.9–10.0)

## 2017-09-24 LAB — CMP (CANCER CENTER ONLY)
ALK PHOS: 74 U/L (ref 26–84)
ALT: 12 U/L (ref 10–47)
ANION GAP: 11 (ref 5–15)
AST: 22 U/L (ref 11–38)
Albumin: 3.8 g/dL (ref 3.5–5.0)
BILIRUBIN TOTAL: 1 mg/dL (ref 0.2–1.6)
BUN: 20 mg/dL (ref 7–22)
CALCIUM: 10 mg/dL (ref 8.0–10.3)
CHLORIDE: 102 mmol/L (ref 98–108)
CO2: 31 mmol/L (ref 18–33)
CREATININE: 1.1 mg/dL (ref 0.60–1.20)
Glucose, Bld: 97 mg/dL (ref 73–118)
Potassium: 3.6 mmol/L (ref 3.3–4.7)
Sodium: 144 mmol/L (ref 128–145)
Total Protein: 8.1 g/dL (ref 6.4–8.1)

## 2017-09-24 MED ORDER — DIPHENHYDRAMINE HCL 50 MG/ML IJ SOLN
INTRAMUSCULAR | Status: AC
  Filled 2017-09-24: qty 1

## 2017-09-24 MED ORDER — SODIUM CHLORIDE 0.9 % IV SOLN
20.0000 mg | Freq: Once | INTRAVENOUS | Status: AC
  Administered 2017-09-24: 20 mg via INTRAVENOUS
  Filled 2017-09-24: qty 2

## 2017-09-24 MED ORDER — DEXAMETHASONE SODIUM PHOSPHATE 10 MG/ML IJ SOLN
INTRAMUSCULAR | Status: AC
  Filled 2017-09-24: qty 2

## 2017-09-24 MED ORDER — HEPARIN SOD (PORK) LOCK FLUSH 100 UNIT/ML IV SOLN
500.0000 [IU] | Freq: Once | INTRAVENOUS | Status: AC | PRN
  Administered 2017-09-24: 500 [IU]
  Filled 2017-09-24: qty 5

## 2017-09-24 MED ORDER — TRASTUZUMAB CHEMO 150 MG IV SOLR
6.0000 mg/kg | Freq: Once | INTRAVENOUS | Status: AC
  Administered 2017-09-24: 357 mg via INTRAVENOUS
  Filled 2017-09-24: qty 17

## 2017-09-24 MED ORDER — OXYCODONE HCL 5 MG PO TABS: 5.0000 mg | ORAL_TABLET | Freq: Four times a day (QID) | ORAL | 0 refills | Status: DC | PRN

## 2017-09-24 MED ORDER — SODIUM CHLORIDE 0.9% FLUSH
10.0000 mL | INTRAVENOUS | Status: DC | PRN
  Administered 2017-09-24: 10 mL
  Filled 2017-09-24: qty 10

## 2017-09-24 MED ORDER — DIPHENHYDRAMINE HCL 50 MG/ML IJ SOLN
12.5000 mg | Freq: Once | INTRAMUSCULAR | Status: AC
  Administered 2017-09-24: 12.5 mg via INTRAVENOUS

## 2017-09-24 MED ORDER — SODIUM CHLORIDE 0.9 % IV SOLN
Freq: Once | INTRAVENOUS | Status: AC
  Administered 2017-09-24: 10:00:00 via INTRAVENOUS

## 2017-09-24 NOTE — Patient Instructions (Signed)
Implanted Port Home Guide An implanted port is a type of central line that is placed under the skin. Central lines are used to provide IV access when treatment or nutrition needs to be given through a person's veins. Implanted ports are used for long-term IV access. An implanted port may be placed because:  You need IV medicine that would be irritating to the small veins in your hands or arms.  You need long-term IV medicines, such as antibiotics.  You need IV nutrition for a long period.  You need frequent blood draws for lab tests.  You need dialysis.  Implanted ports are usually placed in the chest area, but they can also be placed in the upper arm, the abdomen, or the leg. An implanted port has two main parts:  Reservoir. The reservoir is round and will appear as a small, raised area under your skin. The reservoir is the part where a needle is inserted to give medicines or draw blood.  Catheter. The catheter is a thin, flexible tube that extends from the reservoir. The catheter is placed into a large vein. Medicine that is inserted into the reservoir goes into the catheter and then into the vein.  How will I care for my incision site? Do not get the incision site wet. Bathe or shower as directed by your health care provider. How is my port accessed? Special steps must be taken to access the port:  Before the port is accessed, a numbing cream can be placed on the skin. This helps numb the skin over the port site.  Your health care provider uses a sterile technique to access the port. ? Your health care provider must put on a mask and sterile gloves. ? The skin over your port is cleaned carefully with an antiseptic and allowed to dry. ? The port is gently pinched between sterile gloves, and a needle is inserted into the port.  Only "non-coring" port needles should be used to access the port. Once the port is accessed, a blood return should be checked. This helps ensure that the port  is in the vein and is not clogged.  If your port needs to remain accessed for a constant infusion, a clear (transparent) bandage will be placed over the needle site. The bandage and needle will need to be changed every week, or as directed by your health care provider.  Keep the bandage covering the needle clean and dry. Do not get it wet. Follow your health care provider's instructions on how to take a shower or bath while the port is accessed.  If your port does not need to stay accessed, no bandage is needed over the port.  What is flushing? Flushing helps keep the port from getting clogged. Follow your health care provider's instructions on how and when to flush the port. Ports are usually flushed with saline solution or a medicine called heparin. The need for flushing will depend on how the port is used.  If the port is used for intermittent medicines or blood draws, the port will need to be flushed: ? After medicines have been given. ? After blood has been drawn. ? As part of routine maintenance.  If a constant infusion is running, the port may not need to be flushed.  How long will my port stay implanted? The port can stay in for as long as your health care provider thinks it is needed. When it is time for the port to come out, surgery will be   done to remove it. The procedure is similar to the one performed when the port was put in. When should I seek immediate medical care? When you have an implanted port, you should seek immediate medical care if:  You notice a bad smell coming from the incision site.  You have swelling, redness, or drainage at the incision site.  You have more swelling or pain at the port site or the surrounding area.  You have a fever that is not controlled with medicine.  This information is not intended to replace advice given to you by your health care provider. Make sure you discuss any questions you have with your health care provider. Document  Released: 06/09/2005 Document Revised: 11/15/2015 Document Reviewed: 02/14/2013 Elsevier Interactive Patient Education  2017 [employer]  

## 2017-09-24 NOTE — Progress Notes (Signed)
Hematology and Oncology Follow Up Visit  Michelle Bowen [id number] Feb 10, 1941 77 y.o. 09/24/2017   Principle Diagnosis:  Metastatic breast cancer - RIGHT - bone mets - TRIPLE POSITIVE  Current Therapy:   Herceptin 360 mg IV q 3 week - s/p cycle #6 Femara 2.5 mg po q day Xgeva 120 mg IM q 3 months - due again in June 2019    Interim History:  Michelle Bowen is here today with her friend for follow-up and treatment.  Overall, she is doing quite well nicely.  She does have some occasional pain in the left hip.  She takes oxycodone for this.  She only takes 2 oxycodone a day.  This really has helped her.  Her quality of life is so much better.  Her echocardiogram was done back in early March.  Her ejection fraction was 60-65%.  She is having no problems taking the Femara.  There is been no nausea or vomiting.  Her last studies for her breast scans were done back in January.  As such, we probably are going to have to get her set up with some additional studies after we see her again.  Her last CA 27.29 in March was 23.  This is holding relatively stable.  She is had no cough or shortness of breath.  Her appetite is good.  She has had no change in bowel or bladder habits.  There is been no leg swelling.  She is had no rashes.  Overall, I said her performance status is ECOG 1.   Medications:  Allergies as of 09/24/2017   No Active Allergies     Medication List        Accurate as of 09/24/17 10:07 AM. Always use your most recent med list.          cyanocobalamin 500 MCG tablet Take 500 mcg by mouth daily.   gabapentin 300 MG capsule Commonly known as:  NEURONTIN Take 1 capsule (300 mg total) by mouth at bedtime.   letrozole 2.5 MG tablet Commonly known as:  FEMARA Take 1 tablet (2.5 mg total) by mouth daily.   Misc. Devices Misc For Bilateral hip pain   oxyCODONE 5 MG immediate release tablet Commonly known as:  Oxy IR/ROXICODONE Take 1 tablet (5 mg total) by mouth every 6  (six) hours as needed for severe pain.   PROBIOTIC ACIDOPHILUS PO Take by mouth.   triamterene-hydrochlorothiazide 37.5-25 MG capsule Commonly known as:  DYAZIDE Take 1 capsule by mouth daily.   Vitamin D3 5000 units Tabs Take by mouth.       Allergies:  No Active Allergies  Past Medical History, Surgical history, Social history, and Family History were reviewed and updated.  Review of Systems: Review of Systems  Constitutional: Positive for malaise/fatigue.  HENT: Negative.   Eyes: Negative.   Respiratory: Negative.   Cardiovascular: Negative.   Gastrointestinal: Negative.   Genitourinary: Negative.   Musculoskeletal: Positive for joint pain and myalgias.  Skin: Negative.   Neurological: Negative.   Endo/Heme/Allergies: Negative.   Psychiatric/Behavioral: Negative.      Physical Exam:  weight is 136 lb (61.7 kg). Her oral temperature is 98.1 F (36.7 C). Her blood pressure is 182/81 (abnormal) and her pulse is 74. Her respiration is 17 and oxygen saturation is 100%.   Wt Readings from Last 3 Encounters:  09/24/17 136 lb (61.7 kg)  09/03/17 136 lb 4 oz (61.8 kg)  08/13/17 132 lb 4 oz (60 kg)    Physical  Exam  Constitutional: She is oriented to person, place, and time.  HENT:  Head: Normocephalic and atraumatic.  Mouth/Throat: Oropharynx is clear and moist.  Eyes: Pupils are equal, round, and reactive to light. EOM are normal.  Neck: Normal range of motion.  Cardiovascular: Normal rate, regular rhythm and normal heart sounds.  Pulmonary/Chest: Effort normal and breath sounds normal.  Abdominal: Soft. Bowel sounds are normal.  Musculoskeletal: Normal range of motion. She exhibits no edema, tenderness or deformity.  Lymphadenopathy:    She has no cervical adenopathy.  Neurological: She is alert and oriented to person, place, and time.  Skin: Skin is warm and dry. No rash noted. No erythema.  Psychiatric: She has a normal mood and affect. Her behavior is  normal. Judgment and thought content normal.  Vitals reviewed.    Lab Results  Component Value Date   WBC 3.0 (L) 09/24/2017   HGB 10.3 (L) 06/11/2017   HCT 32.6 (L) 09/24/2017   MCV 87.2 09/24/2017   PLT 248 09/24/2017   No results found for: FERRITIN, IRON, TIBC, UIBC, IRONPCTSAT Lab Results  Component Value Date   RBC 3.74 09/24/2017   No results found for: KPAFRELGTCHN, LAMBDASER, KAPLAMBRATIO No results found for: IGGSERUM, IGA, IGMSERUM No results found for: Odetta Pink, SPEI   Chemistry      Component Value Date/Time   NA 144 09/24/2017 0920   NA 147 (H) 06/11/2017 1345   NA 139 04/23/2017 1256   K 3.6 09/24/2017 0920   K 4.3 06/11/2017 1345   K 4.2 04/23/2017 1256   CL 102 09/24/2017 0920   CL 106 06/11/2017 1345   CO2 31 09/24/2017 0920   CO2 27 06/11/2017 1345   CO2 28 04/23/2017 1256   BUN 20 09/24/2017 0920   BUN 13 06/11/2017 1345   BUN 10.0 04/23/2017 1256   CREATININE 1.10 09/24/2017 0920   CREATININE 1.1 06/11/2017 1345   CREATININE 0.8 04/23/2017 1256      Component Value Date/Time   CALCIUM 10.0 09/24/2017 0920   CALCIUM 8.9 06/11/2017 1345   CALCIUM 8.7 04/23/2017 1256   ALKPHOS 74 09/24/2017 0920   ALKPHOS 85 (H) 06/11/2017 1345   ALKPHOS 104 04/23/2017 1256   AST 22 09/24/2017 0920   AST 15 04/23/2017 1256   ALT 12 09/24/2017 0920   ALT 17 06/11/2017 1345   ALT <6 04/23/2017 1256   BILITOT 1.0 09/24/2017 0920   BILITOT 0.59 04/23/2017 1256      Impression and Plan: Michelle Bowen is a very pleasant 77 yo African American female with metastatic breast cancer, triple positive.  We will go ahead with the Herceptin today.  She still does not want the Perjeta added.  May be, if we find that she does show some disease progression, we could add Perjeta.  The other option would be to switch her over to Faslodex from the Femara.  We will plan to see her back in 3 weeks.  We will talk to her  when we see her back about doing the next set of scans to assess her breast cancer status.    Michelle Napoleon, MD 4/4/201910:07 AM

## 2017-09-24 NOTE — Patient Instructions (Signed)
Trastuzumab injection for infusion What is this medicine? TRASTUZUMAB (tras TOO zoo mab) is a monoclonal antibody. It is used to treat breast cancer and stomach cancer. This medicine may be used for other purposes; ask your health care provider or pharmacist if you have questions. COMMON BRAND NAME(S): Herceptin What should I tell my health care provider before I take this medicine? They need to know if you have any of these conditions: -heart disease -heart failure -lung or breathing disease, like asthma -an unusual or allergic reaction to trastuzumab, benzyl alcohol, or other medications, foods, dyes, or preservatives -pregnant or trying to get pregnant -breast-feeding How should I use this medicine? This drug is given as an infusion into a vein. It is administered in a hospital or clinic by a specially trained health care professional. Talk to your pediatrician regarding the use of this medicine in children. This medicine is not approved for use in children. Overdosage: If you think you have taken too much of this medicine contact a poison control center or emergency room at once. NOTE: This medicine is only for you. Do not share this medicine with others. What if I miss a dose? It is important not to miss a dose. Call your doctor or health care professional if you are unable to keep an appointment. What may interact with this medicine? This medicine may interact with the following medications: -certain types of chemotherapy, such as daunorubicin, doxorubicin, epirubicin, and idarubicin This list may not describe all possible interactions. Give your health care provider a list of all the medicines, herbs, non-prescription drugs, or dietary supplements you use. Also tell them if you smoke, drink alcohol, or use illegal drugs. Some items may interact with your medicine. What should I watch for while using this medicine? Visit your doctor for checks on your progress. Report any side effects.  Continue your course of treatment even though you feel ill unless your doctor tells you to stop. Call your doctor or health care professional for advice if you get a fever, chills or sore throat, or other symptoms of a cold or flu. Do not treat yourself. Try to avoid being around people who are sick. You may experience fever, chills and shaking during your first infusion. These effects are usually mild and can be treated with other medicines. Report any side effects during the infusion to your health care professional. Fever and chills usually do not happen with later infusions. Do not become pregnant while taking this medicine or for 7 months after stopping it. Women should inform their doctor if they wish to become pregnant or think they might be pregnant. Women of child-bearing potential will need to have a negative pregnancy test before starting this medicine. There is a potential for serious side effects to an unborn child. Talk to your health care professional or pharmacist for more information. Do not breast-feed an infant while taking this medicine or for 7 months after stopping it. Women must use effective birth control with this medicine. What side effects may I notice from receiving this medicine? Side effects that you should report to your doctor or health care professional as soon as possible: -allergic reactions like skin rash, itching or hives, swelling of the face, lips, or tongue -chest pain or palpitations -cough -dizziness -feeling faint or lightheaded, falls -fever -general ill feeling or flu-like symptoms -signs of worsening heart failure like breathing problems; swelling in your legs and feet -unusually weak or tired Side effects that usually do not require medical   attention (report to your doctor or health care professional if they continue or are bothersome): -bone pain -changes in taste -diarrhea -joint pain -nausea/vomiting -weight loss This list may not describe all  possible side effects. Call your doctor for medical advice about side effects. You may report side effects to FDA at 1-800-FDA-1088. Where should I keep my medicine? This drug is given in a hospital or clinic and will not be stored at home. NOTE: This sheet is a summary. It may not cover all possible information. If you have questions about this medicine, talk to your doctor, pharmacist, or health care provider.  2018 Elsevier/Gold Standard (2016-06-03 14:37:52)  

## 2017-09-25 LAB — CANCER ANTIGEN 27.29: CAN 27.29: 25.5 U/mL (ref 0.0–38.6)

## 2017-10-15 ENCOUNTER — Inpatient Hospital Stay: Payer: Medicare Other

## 2017-10-15 ENCOUNTER — Inpatient Hospital Stay (HOSPITAL_BASED_OUTPATIENT_CLINIC_OR_DEPARTMENT_OTHER): Payer: Medicare Other | Admitting: Hematology & Oncology

## 2017-10-15 ENCOUNTER — Other Ambulatory Visit: Payer: Self-pay

## 2017-10-15 ENCOUNTER — Other Ambulatory Visit: Payer: Self-pay | Admitting: *Deleted

## 2017-10-15 VITALS — BP 187/81 | HR 65 | Temp 98.1°F | Resp 17 | Wt 140.0 lb

## 2017-10-15 VITALS — BP 184/75

## 2017-10-15 DIAGNOSIS — Z170 Estrogen receptor positive status [ER+]: Principal | ICD-10-CM

## 2017-10-15 DIAGNOSIS — D508 Other iron deficiency anemias: Secondary | ICD-10-CM

## 2017-10-15 DIAGNOSIS — C50411 Malignant neoplasm of upper-outer quadrant of right female breast: Secondary | ICD-10-CM

## 2017-10-15 DIAGNOSIS — C7951 Secondary malignant neoplasm of bone: Secondary | ICD-10-CM | POA: Diagnosis not present

## 2017-10-15 DIAGNOSIS — C50911 Malignant neoplasm of unspecified site of right female breast: Secondary | ICD-10-CM

## 2017-10-15 DIAGNOSIS — Z79811 Long term (current) use of aromatase inhibitors: Secondary | ICD-10-CM | POA: Diagnosis not present

## 2017-10-15 DIAGNOSIS — Z5112 Encounter for antineoplastic immunotherapy: Secondary | ICD-10-CM | POA: Diagnosis not present

## 2017-10-15 LAB — CBC WITH DIFFERENTIAL (CANCER CENTER ONLY)
Basophils Absolute: 0 10*3/uL (ref 0.0–0.1)
Basophils Relative: 0 %
EOS ABS: 0.1 10*3/uL (ref 0.0–0.5)
Eosinophils Relative: 1 %
HCT: 32.7 % — ABNORMAL LOW (ref 34.8–46.6)
HEMOGLOBIN: 10.5 g/dL — AB (ref 11.6–15.9)
LYMPHS ABS: 1 10*3/uL (ref 0.9–3.3)
Lymphocytes Relative: 26 %
MCH: 28.5 pg (ref 26.0–34.0)
MCHC: 32.1 g/dL (ref 32.0–36.0)
MCV: 88.6 fL (ref 81.0–101.0)
MONOS PCT: 14 %
Monocytes Absolute: 0.5 10*3/uL (ref 0.1–0.9)
NEUTROS PCT: 59 %
Neutro Abs: 2.1 10*3/uL (ref 1.5–6.5)
Platelet Count: 233 10*3/uL (ref 145–400)
RBC: 3.69 MIL/uL — ABNORMAL LOW (ref 3.70–5.32)
RDW: 13.8 % (ref 11.1–15.7)
WBC Count: 3.7 10*3/uL — ABNORMAL LOW (ref 3.9–10.0)

## 2017-10-15 LAB — CMP (CANCER CENTER ONLY)
ALT: 21 U/L (ref 10–47)
ANION GAP: 11 (ref 5–15)
AST: 24 U/L (ref 11–38)
Albumin: 3.8 g/dL (ref 3.5–5.0)
Alkaline Phosphatase: 72 U/L (ref 26–84)
BUN: 21 mg/dL (ref 7–22)
CHLORIDE: 104 mmol/L (ref 98–108)
CO2: 30 mmol/L (ref 18–33)
Calcium: 9.7 mg/dL (ref 8.0–10.3)
Creatinine: 1 mg/dL (ref 0.60–1.20)
Glucose, Bld: 96 mg/dL (ref 73–118)
Potassium: 3.5 mmol/L (ref 3.3–4.7)
Sodium: 145 mmol/L (ref 128–145)
Total Bilirubin: 1 mg/dL (ref 0.2–1.6)
Total Protein: 7.8 g/dL (ref 6.4–8.1)

## 2017-10-15 LAB — CEA (IN HOUSE-CHCC): CEA (CHCC-In House): 1.54 ng/mL (ref 0.00–5.00)

## 2017-10-15 LAB — LACTATE DEHYDROGENASE: LDH: 212 U/L (ref 125–245)

## 2017-10-15 MED ORDER — SODIUM CHLORIDE 0.9% FLUSH
10.0000 mL | INTRAVENOUS | Status: DC | PRN
  Administered 2017-10-15: 10 mL
  Filled 2017-10-15: qty 10

## 2017-10-15 MED ORDER — TRASTUZUMAB CHEMO 150 MG IV SOLR
6.0000 mg/kg | Freq: Once | INTRAVENOUS | Status: AC
  Administered 2017-10-15: 357 mg via INTRAVENOUS
  Filled 2017-10-15: qty 17

## 2017-10-15 MED ORDER — SODIUM CHLORIDE 0.9 % IV SOLN
20.0000 mg | Freq: Once | INTRAVENOUS | Status: AC
  Administered 2017-10-15: 20 mg via INTRAVENOUS
  Filled 2017-10-15: qty 2

## 2017-10-15 MED ORDER — LETROZOLE 2.5 MG PO TABS: 2.5000 mg | ORAL_TABLET | Freq: Every day | ORAL | 3 refills | Status: DC

## 2017-10-15 MED ORDER — HEPARIN SOD (PORK) LOCK FLUSH 100 UNIT/ML IV SOLN
500.0000 [IU] | Freq: Once | INTRAVENOUS | Status: AC | PRN
  Administered 2017-10-15: 500 [IU]
  Filled 2017-10-15: qty 5

## 2017-10-15 MED ORDER — SODIUM CHLORIDE 0.9 % IV SOLN
Freq: Once | INTRAVENOUS | Status: AC
  Administered 2017-10-15: 13:00:00 via INTRAVENOUS

## 2017-10-15 MED ORDER — DIPHENHYDRAMINE HCL 50 MG/ML IJ SOLN
12.5000 mg | Freq: Once | INTRAMUSCULAR | Status: AC
  Administered 2017-10-15: 12.5 mg via INTRAVENOUS

## 2017-10-15 MED ORDER — DIPHENHYDRAMINE HCL 50 MG/ML IJ SOLN
INTRAMUSCULAR | Status: AC
  Filled 2017-10-15: qty 1

## 2017-10-15 NOTE — Progress Notes (Signed)
Hematology and Oncology Follow Up Visit  Michelle Bowen [id number] [birth year] 77 y.o. 10/15/2017   Principle Diagnosis:  Metastatic breast cancer - RIGHT - bone mets - TRIPLE POSITIVE  Current Therapy:   Herceptin 360 mg IV q 3 week - s/p cycle #6 Femara 2.5 mg po q day Xgeva 120 mg IM q 3 months - due again in June 2019    Interim History:  Michelle Bowen is here today with her friend for follow-up and treatment.  Overall, she is doing quite well nicely.  She does have some occasional pain in the left hip.  She takes oxycodone for this.  She only takes 2 oxycodone a day.  This really has helped her.  Her quality of life is so much better.  Her echocardiogram was done back in early March.  Her ejection fraction was 60-65%.  She is having no problems taking the Femara.  There is been no nausea or vomiting.  Her last studies for her breast scans were done back in January.  As such, we probably are going to have to get her set up with some additional studies after we see her again.  Her last CA 27.29 in April was 26.  This is holding relatively stable.  She is had no cough or shortness of breath.  Her appetite is good.  She has had no change in bowel or bladder habits.  There is been no leg swelling.  She is had no rashes.  Overall, I said her performance status is ECOG 1.   Medications:  Allergies as of 10/15/2017   No Active Allergies     Medication List        Accurate as of 10/15/17 12:32 PM. Always use your most recent med list.          gabapentin 300 MG capsule Commonly known as:  NEURONTIN Take 1 capsule (300 mg total) by mouth at bedtime.   letrozole 2.5 MG tablet Commonly known as:  FEMARA Take 1 tablet (2.5 mg total) by mouth daily.   Misc. Devices Misc For Bilateral hip pain   oxyCODONE 5 MG immediate release tablet Commonly known as:  Oxy IR/ROXICODONE Take 1 tablet (5 mg total) by mouth every 6 (six) hours as needed for severe pain.   PROBIOTIC ACIDOPHILUS  PO Take by mouth.   triamterene-hydrochlorothiazide 37.5-25 MG capsule Commonly known as:  DYAZIDE Take 1 capsule by mouth daily.   vitamin B-12 500 MCG tablet Commonly known as:  CYANOCOBALAMIN Take 500 mcg by mouth daily.   Vitamin D3 5000 units Tabs Take by mouth.       Allergies:  No Active Allergies  Past Medical History, Surgical history, Social history, and Family History were reviewed and updated.  Review of Systems: Review of Systems  Constitutional: Positive for malaise/fatigue.  HENT: Negative.   Eyes: Negative.   Respiratory: Negative.   Cardiovascular: Negative.   Gastrointestinal: Negative.   Genitourinary: Negative.   Musculoskeletal: Positive for joint pain and myalgias.  Skin: Negative.   Neurological: Negative.   Endo/Heme/Allergies: Negative.   Psychiatric/Behavioral: Negative.      Physical Exam:  weight is 140 lb (63.5 kg). Her oral temperature is 98.1 F (36.7 C). Her blood pressure is 187/81 (abnormal) and her pulse is 65. Her respiration is 17 and oxygen saturation is 100%.   Wt Readings from Last 3 Encounters:  10/15/17 140 lb (63.5 kg)  09/24/17 136 lb (61.7 kg)  09/03/17 136 lb 4 oz (61.8 kg)  Physical Exam  Constitutional: She is oriented to person, place, and time.  HENT:  Head: Normocephalic and atraumatic.  Mouth/Throat: Oropharynx is clear and moist.  Eyes: Pupils are equal, round, and reactive to light. EOM are normal.  Neck: Normal range of motion.  Cardiovascular: Normal rate, regular rhythm and normal heart sounds.  Pulmonary/Chest: Effort normal and breath sounds normal.  Abdominal: Soft. Bowel sounds are normal.  Musculoskeletal: Normal range of motion. She exhibits no edema, tenderness or deformity.  Lymphadenopathy:    She has no cervical adenopathy.  Neurological: She is alert and oriented to person, place, and time.  Skin: Skin is warm and dry. No rash noted. No erythema.  Psychiatric: She has a normal mood  and affect. Her behavior is normal. Judgment and thought content normal.  Vitals reviewed.    Lab Results  Component Value Date   WBC 3.7 (L) 10/15/2017   HGB 10.5 (L) 10/15/2017   HCT 32.7 (L) 10/15/2017   MCV 88.6 10/15/2017   PLT 233 10/15/2017   No results found for: FERRITIN, IRON, TIBC, UIBC, IRONPCTSAT Lab Results  Component Value Date   RBC 3.69 (L) 10/15/2017   No results found for: KPAFRELGTCHN, LAMBDASER, KAPLAMBRATIO No results found for: IGGSERUM, IGA, IGMSERUM No results found for: Odetta Pink, SPEI   Chemistry      Component Value Date/Time   NA 145 10/15/2017 1055   NA 147 (H) 06/11/2017 1345   NA 139 04/23/2017 1256   K 3.5 10/15/2017 1055   K 4.3 06/11/2017 1345   K 4.2 04/23/2017 1256   CL 104 10/15/2017 1055   CL 106 06/11/2017 1345   CO2 30 10/15/2017 1055   CO2 27 06/11/2017 1345   CO2 28 04/23/2017 1256   BUN 21 10/15/2017 1055   BUN 13 06/11/2017 1345   BUN 10.0 04/23/2017 1256   CREATININE 1.00 10/15/2017 1055   CREATININE 1.1 06/11/2017 1345   CREATININE 0.8 04/23/2017 1256      Component Value Date/Time   CALCIUM 9.7 10/15/2017 1055   CALCIUM 8.9 06/11/2017 1345   CALCIUM 8.7 04/23/2017 1256   ALKPHOS 72 10/15/2017 1055   ALKPHOS 85 (H) 06/11/2017 1345   ALKPHOS 104 04/23/2017 1256   AST 24 10/15/2017 1055   AST 15 04/23/2017 1256   ALT 21 10/15/2017 1055   ALT 17 06/11/2017 1345   ALT <6 04/23/2017 1256   BILITOT 1.0 10/15/2017 1055   BILITOT 0.59 04/23/2017 1256      Impression and Plan: Michelle Bowen is a very pleasant 77 yo African American female with metastatic breast cancer, triple positive.  We will go ahead with the Herceptin today.  She still does not want the Perjeta added.  May be, if we find that she does show some disease progression, we could add Perjeta.  The other option would be to switch her over to Faslodex from the Femara.  We will plan to see her back in 3  weeks.  We will talk to her when we see her back about doing the next set of scans to assess her breast cancer status.    Volanda Napoleon, MD 4/25/201912:32 PM

## 2017-10-15 NOTE — Patient Instructions (Signed)
Trastuzumab injection for infusion What is this medicine? TRASTUZUMAB (tras TOO zoo mab) is a monoclonal antibody. It is used to treat breast cancer and stomach cancer. This medicine may be used for other purposes; ask your health care provider or pharmacist if you have questions. COMMON BRAND NAME(S): Herceptin What should I tell my health care provider before I take this medicine? They need to know if you have any of these conditions: -heart disease -heart failure -lung or breathing disease, like asthma -an unusual or allergic reaction to trastuzumab, benzyl alcohol, or other medications, foods, dyes, or preservatives -pregnant or trying to get pregnant -breast-feeding How should I use this medicine? This drug is given as an infusion into a vein. It is administered in a hospital or clinic by a specially trained health care professional. Talk to your pediatrician regarding the use of this medicine in children. This medicine is not approved for use in children. Overdosage: If you think you have taken too much of this medicine contact a poison control center or emergency room at once. NOTE: This medicine is only for you. Do not share this medicine with others. What if I miss a dose? It is important not to miss a dose. Call your doctor or health care professional if you are unable to keep an appointment. What may interact with this medicine? This medicine may interact with the following medications: -certain types of chemotherapy, such as daunorubicin, doxorubicin, epirubicin, and idarubicin This list may not describe all possible interactions. Give your health care provider a list of all the medicines, herbs, non-prescription drugs, or dietary supplements you use. Also tell them if you smoke, drink alcohol, or use illegal drugs. Some items may interact with your medicine. What should I watch for while using this medicine? Visit your doctor for checks on your progress. Report any side effects.  Continue your course of treatment even though you feel ill unless your doctor tells you to stop. Call your doctor or health care professional for advice if you get a fever, chills or sore throat, or other symptoms of a cold or flu. Do not treat yourself. Try to avoid being around people who are sick. You may experience fever, chills and shaking during your first infusion. These effects are usually mild and can be treated with other medicines. Report any side effects during the infusion to your health care professional. Fever and chills usually do not happen with later infusions. Do not become pregnant while taking this medicine or for 7 months after stopping it. Women should inform their doctor if they wish to become pregnant or think they might be pregnant. Women of child-bearing potential will need to have a negative pregnancy test before starting this medicine. There is a potential for serious side effects to an unborn child. Talk to your health care professional or pharmacist for more information. Do not breast-feed an infant while taking this medicine or for 7 months after stopping it. Women must use effective birth control with this medicine. What side effects may I notice from receiving this medicine? Side effects that you should report to your doctor or health care professional as soon as possible: -allergic reactions like skin rash, itching or hives, swelling of the face, lips, or tongue -chest pain or palpitations -cough -dizziness -feeling faint or lightheaded, falls -fever -general ill feeling or flu-like symptoms -signs of worsening heart failure like breathing problems; swelling in your legs and feet -unusually weak or tired Side effects that usually do not require medical   attention (report to your doctor or health care professional if they continue or are bothersome): -bone pain -changes in taste -diarrhea -joint pain -nausea/vomiting -weight loss This list may not describe all  possible side effects. Call your doctor for medical advice about side effects. You may report side effects to FDA at 1-800-FDA-1088. Where should I keep my medicine? This drug is given in a hospital or clinic and will not be stored at home. NOTE: This sheet is a summary. It may not cover all possible information. If you have questions about this medicine, talk to your doctor, pharmacist, or health care provider.  2018 Elsevier/Gold Standard (2016-06-03 14:37:52)  

## 2017-10-15 NOTE — Patient Instructions (Signed)
Implanted Port Home Guide An implanted port is a type of central line that is placed under the skin. Central lines are used to provide IV access when treatment or nutrition needs to be given through a person's veins. Implanted ports are used for long-term IV access. An implanted port may be placed because:  You need IV medicine that would be irritating to the small veins in your hands or arms.  You need long-term IV medicines, such as antibiotics.  You need IV nutrition for a long period.  You need frequent blood draws for lab tests.  You need dialysis.  Implanted ports are usually placed in the chest area, but they can also be placed in the upper arm, the abdomen, or the leg. An implanted port has two main parts:  Reservoir. The reservoir is round and will appear as a small, raised area under your skin. The reservoir is the part where a needle is inserted to give medicines or draw blood.  Catheter. The catheter is a thin, flexible tube that extends from the reservoir. The catheter is placed into a large vein. Medicine that is inserted into the reservoir goes into the catheter and then into the vein.  How will I care for my incision site? Do not get the incision site wet. Bathe or shower as directed by your health care provider. How is my port accessed? Special steps must be taken to access the port:  Before the port is accessed, a numbing cream can be placed on the skin. This helps numb the skin over the port site.  Your health care provider uses a sterile technique to access the port. ? Your health care provider must put on a mask and sterile gloves. ? The skin over your port is cleaned carefully with an antiseptic and allowed to dry. ? The port is gently pinched between sterile gloves, and a needle is inserted into the port.  Only "non-coring" port needles should be used to access the port. Once the port is accessed, a blood return should be checked. This helps ensure that the port  is in the vein and is not clogged.  If your port needs to remain accessed for a constant infusion, a clear (transparent) bandage will be placed over the needle site. The bandage and needle will need to be changed every week, or as directed by your health care provider.  Keep the bandage covering the needle clean and dry. Do not get it wet. Follow your health care provider's instructions on how to take a shower or bath while the port is accessed.  If your port does not need to stay accessed, no bandage is needed over the port.  What is flushing? Flushing helps keep the port from getting clogged. Follow your health care provider's instructions on how and when to flush the port. Ports are usually flushed with saline solution or a medicine called heparin. The need for flushing will depend on how the port is used.  If the port is used for intermittent medicines or blood draws, the port will need to be flushed: ? After medicines have been given. ? After blood has been drawn. ? As part of routine maintenance.  If a constant infusion is running, the port may not need to be flushed.  How long will my port stay implanted? The port can stay in for as long as your health care provider thinks it is needed. When it is time for the port to come out, surgery will be   done to remove it. The procedure is similar to the one performed when the port was put in. When should I seek immediate medical care? When you have an implanted port, you should seek immediate medical care if:  You notice a bad smell coming from the incision site.  You have swelling, redness, or drainage at the incision site.  You have more swelling or pain at the port site or the surrounding area.  You have a fever that is not controlled with medicine.  This information is not intended to replace advice given to you by your health care provider. Make sure you discuss any questions you have with your health care provider. Document  Released: 06/09/2005 Document Revised: 11/15/2015 Document Reviewed: 02/14/2013 Elsevier Interactive Patient Education  2017 [employer]  

## 2017-10-16 LAB — CANCER ANTIGEN 27.29: CA 27.29: 20 U/mL (ref 0.0–38.6)

## 2017-11-03 ENCOUNTER — Other Ambulatory Visit: Payer: Self-pay | Admitting: Hematology & Oncology

## 2017-11-05 ENCOUNTER — Inpatient Hospital Stay: Payer: Medicare Other

## 2017-11-05 ENCOUNTER — Inpatient Hospital Stay: Payer: Medicare Other | Attending: Hematology & Oncology | Admitting: Family

## 2017-11-05 ENCOUNTER — Other Ambulatory Visit: Payer: Self-pay | Admitting: *Deleted

## 2017-11-05 ENCOUNTER — Other Ambulatory Visit: Payer: Self-pay

## 2017-11-05 ENCOUNTER — Encounter: Payer: Self-pay | Admitting: Family

## 2017-11-05 VITALS — BP 182/72 | HR 70 | Temp 97.8°F | Resp 16 | Wt 143.0 lb

## 2017-11-05 DIAGNOSIS — Z5112 Encounter for antineoplastic immunotherapy: Secondary | ICD-10-CM | POA: Diagnosis present

## 2017-11-05 DIAGNOSIS — Z170 Estrogen receptor positive status [ER+]: Secondary | ICD-10-CM | POA: Insufficient documentation

## 2017-11-05 DIAGNOSIS — C7951 Secondary malignant neoplasm of bone: Secondary | ICD-10-CM | POA: Diagnosis not present

## 2017-11-05 DIAGNOSIS — C50411 Malignant neoplasm of upper-outer quadrant of right female breast: Secondary | ICD-10-CM

## 2017-11-05 DIAGNOSIS — C50911 Malignant neoplasm of unspecified site of right female breast: Secondary | ICD-10-CM | POA: Diagnosis not present

## 2017-11-05 DIAGNOSIS — D508 Other iron deficiency anemias: Secondary | ICD-10-CM

## 2017-11-05 LAB — CMP (CANCER CENTER ONLY)
ALK PHOS: 63 U/L (ref 26–84)
ALT: 20 U/L (ref 10–47)
ANION GAP: 13 (ref 5–15)
AST: 23 U/L (ref 11–38)
Albumin: 3.7 g/dL (ref 3.5–5.0)
BILIRUBIN TOTAL: 0.9 mg/dL (ref 0.2–1.6)
BUN: 20 mg/dL (ref 7–22)
CO2: 28 mmol/L (ref 18–33)
Calcium: 9.1 mg/dL (ref 8.0–10.3)
Chloride: 104 mmol/L (ref 98–108)
Creatinine: 1.2 mg/dL (ref 0.60–1.20)
Glucose, Bld: 77 mg/dL (ref 73–118)
POTASSIUM: 3.5 mmol/L (ref 3.3–4.7)
SODIUM: 145 mmol/L (ref 128–145)
TOTAL PROTEIN: 7.7 g/dL (ref 6.4–8.1)

## 2017-11-05 LAB — CBC WITH DIFFERENTIAL (CANCER CENTER ONLY)
Basophils Absolute: 0 10*3/uL (ref 0.0–0.1)
Basophils Relative: 0 %
EOS ABS: 0.1 10*3/uL (ref 0.0–0.5)
Eosinophils Relative: 2 %
HCT: 31.1 % — ABNORMAL LOW (ref 34.8–46.6)
Hemoglobin: 9.8 g/dL — ABNORMAL LOW (ref 11.6–15.9)
LYMPHS ABS: 0.9 10*3/uL (ref 0.9–3.3)
Lymphocytes Relative: 21 %
MCH: 28.2 pg (ref 26.0–34.0)
MCHC: 31.5 g/dL — AB (ref 32.0–36.0)
MCV: 89.6 fL (ref 81.0–101.0)
Monocytes Absolute: 0.4 10*3/uL (ref 0.1–0.9)
Monocytes Relative: 10 %
Neutro Abs: 2.7 10*3/uL (ref 1.5–6.5)
Neutrophils Relative %: 67 %
PLATELETS: 243 10*3/uL (ref 145–400)
RBC: 3.47 MIL/uL — ABNORMAL LOW (ref 3.70–5.32)
RDW: 13.6 % (ref 11.1–15.7)
WBC: 4.1 10*3/uL (ref 3.9–10.0)

## 2017-11-05 MED ORDER — DIPHENHYDRAMINE HCL 50 MG/ML IJ SOLN
INTRAMUSCULAR | Status: AC
  Filled 2017-11-05: qty 1

## 2017-11-05 MED ORDER — HEPARIN SOD (PORK) LOCK FLUSH 100 UNIT/ML IV SOLN
500.0000 [IU] | Freq: Once | INTRAVENOUS | Status: AC | PRN
  Administered 2017-11-05: 500 [IU]
  Filled 2017-11-05: qty 5

## 2017-11-05 MED ORDER — DIPHENHYDRAMINE HCL 50 MG/ML IJ SOLN
12.5000 mg | Freq: Once | INTRAMUSCULAR | Status: AC
  Administered 2017-11-05: 12.5 mg via INTRAVENOUS

## 2017-11-05 MED ORDER — SODIUM CHLORIDE 0.9 % IJ SOLN
10.0000 mL | Freq: Once | INTRAMUSCULAR | Status: AC
  Administered 2017-11-05: 10 mL
  Filled 2017-11-05: qty 10

## 2017-11-05 MED ORDER — TRASTUZUMAB CHEMO 150 MG IV SOLR
6.0000 mg/kg | Freq: Once | INTRAVENOUS | Status: AC
  Administered 2017-11-05: 357 mg via INTRAVENOUS
  Filled 2017-11-05: qty 17

## 2017-11-05 MED ORDER — SODIUM CHLORIDE 0.9 % IV SOLN
Freq: Once | INTRAVENOUS | Status: AC
  Administered 2017-11-05: 11:00:00 via INTRAVENOUS

## 2017-11-05 MED ORDER — SODIUM CHLORIDE 0.9 % IV SOLN
20.0000 mg | Freq: Once | INTRAVENOUS | Status: AC
  Administered 2017-11-05: 20 mg via INTRAVENOUS
  Filled 2017-11-05: qty 2

## 2017-11-05 MED ORDER — OXYCODONE HCL 5 MG PO TABS: 5.0000 mg | ORAL_TABLET | Freq: Four times a day (QID) | ORAL | 0 refills | Status: DC | PRN

## 2017-11-05 MED ORDER — SODIUM CHLORIDE 0.9% FLUSH
10.0000 mL | INTRAVENOUS | Status: DC | PRN
  Administered 2017-11-05: 10 mL
  Filled 2017-11-05: qty 10

## 2017-11-05 NOTE — Patient Instructions (Signed)
Trastuzumab injection for infusion What is this medicine? TRASTUZUMAB (tras TOO zoo mab) is a monoclonal antibody. It is used to treat breast cancer and stomach cancer. This medicine may be used for other purposes; ask your health care provider or pharmacist if you have questions. COMMON BRAND NAME(S): Herceptin What should I tell my health care provider before I take this medicine? They need to know if you have any of these conditions: -heart disease -heart failure -lung or breathing disease, like asthma -an unusual or allergic reaction to trastuzumab, benzyl alcohol, or other medications, foods, dyes, or preservatives -pregnant or trying to get pregnant -breast-feeding How should I use this medicine? This drug is given as an infusion into a vein. It is administered in a hospital or clinic by a specially trained health care professional. Talk to your pediatrician regarding the use of this medicine in children. This medicine is not approved for use in children. Overdosage: If you think you have taken too much of this medicine contact a poison control center or emergency room at once. NOTE: This medicine is only for you. Do not share this medicine with others. What if I miss a dose? It is important not to miss a dose. Call your doctor or health care professional if you are unable to keep an appointment. What may interact with this medicine? This medicine may interact with the following medications: -certain types of chemotherapy, such as daunorubicin, doxorubicin, epirubicin, and idarubicin This list may not describe all possible interactions. Give your health care provider a list of all the medicines, herbs, non-prescription drugs, or dietary supplements you use. Also tell them if you smoke, drink alcohol, or use illegal drugs. Some items may interact with your medicine. What should I watch for while using this medicine? Visit your doctor for checks on your progress. Report any side effects.  Continue your course of treatment even though you feel ill unless your doctor tells you to stop. Call your doctor or health care professional for advice if you get a fever, chills or sore throat, or other symptoms of a cold or flu. Do not treat yourself. Try to avoid being around people who are sick. You may experience fever, chills and shaking during your first infusion. These effects are usually mild and can be treated with other medicines. Report any side effects during the infusion to your health care professional. Fever and chills usually do not happen with later infusions. Do not become pregnant while taking this medicine or for 7 months after stopping it. Women should inform their doctor if they wish to become pregnant or think they might be pregnant. Women of child-bearing potential will need to have a negative pregnancy test before starting this medicine. There is a potential for serious side effects to an unborn child. Talk to your health care professional or pharmacist for more information. Do not breast-feed an infant while taking this medicine or for 7 months after stopping it. Women must use effective birth control with this medicine. What side effects may I notice from receiving this medicine? Side effects that you should report to your doctor or health care professional as soon as possible: -allergic reactions like skin rash, itching or hives, swelling of the face, lips, or tongue -chest pain or palpitations -cough -dizziness -feeling faint or lightheaded, falls -fever -general ill feeling or flu-like symptoms -signs of worsening heart failure like breathing problems; swelling in your legs and feet -unusually weak or tired Side effects that usually do not require medical   attention (report to your doctor or health care professional if they continue or are bothersome): -bone pain -changes in taste -diarrhea -joint pain -nausea/vomiting -weight loss This list may not describe all  possible side effects. Call your doctor for medical advice about side effects. You may report side effects to FDA at 1-800-FDA-1088. Where should I keep my medicine? This drug is given in a hospital or clinic and will not be stored at home. NOTE: This sheet is a summary. It may not cover all possible information. If you have questions about this medicine, talk to your doctor, pharmacist, or health care provider.  2018 Elsevier/Gold Standard (2016-06-03 14:37:52)  

## 2017-11-05 NOTE — Patient Instructions (Signed)
Implanted Port Home Guide An implanted port is a type of central line that is placed under the skin. Central lines are used to provide IV access when treatment or nutrition needs to be given through a person's veins. Implanted ports are used for long-term IV access. An implanted port may be placed because:  You need IV medicine that would be irritating to the small veins in your hands or arms.  You need long-term IV medicines, such as antibiotics.  You need IV nutrition for a long period.  You need frequent blood draws for lab tests.  You need dialysis.  Implanted ports are usually placed in the chest area, but they can also be placed in the upper arm, the abdomen, or the leg. An implanted port has two main parts:  Reservoir. The reservoir is round and will appear as a small, raised area under your skin. The reservoir is the part where a needle is inserted to give medicines or draw blood.  Catheter. The catheter is a thin, flexible tube that extends from the reservoir. The catheter is placed into a large vein. Medicine that is inserted into the reservoir goes into the catheter and then into the vein.  How will I care for my incision site? Do not get the incision site wet. Bathe or shower as directed by your health care provider. How is my port accessed? Special steps must be taken to access the port:  Before the port is accessed, a numbing cream can be placed on the skin. This helps numb the skin over the port site.  Your health care provider uses a sterile technique to access the port. ? Your health care provider must put on a mask and sterile gloves. ? The skin over your port is cleaned carefully with an antiseptic and allowed to dry. ? The port is gently pinched between sterile gloves, and a needle is inserted into the port.  Only "non-coring" port needles should be used to access the port. Once the port is accessed, a blood return should be checked. This helps ensure that the port  is in the vein and is not clogged.  If your port needs to remain accessed for a constant infusion, a clear (transparent) bandage will be placed over the needle site. The bandage and needle will need to be changed every week, or as directed by your health care provider.  Keep the bandage covering the needle clean and dry. Do not get it wet. Follow your health care provider's instructions on how to take a shower or bath while the port is accessed.  If your port does not need to stay accessed, no bandage is needed over the port.  What is flushing? Flushing helps keep the port from getting clogged. Follow your health care provider's instructions on how and when to flush the port. Ports are usually flushed with saline solution or a medicine called heparin. The need for flushing will depend on how the port is used.  If the port is used for intermittent medicines or blood draws, the port will need to be flushed: ? After medicines have been given. ? After blood has been drawn. ? As part of routine maintenance.  If a constant infusion is running, the port may not need to be flushed.  How long will my port stay implanted? The port can stay in for as long as your health care provider thinks it is needed. When it is time for the port to come out, surgery will be   done to remove it. The procedure is similar to the one performed when the port was put in. When should I seek immediate medical care? When you have an implanted port, you should seek immediate medical care if:  You notice a bad smell coming from the incision site.  You have swelling, redness, or drainage at the incision site.  You have more swelling or pain at the port site or the surrounding area.  You have a fever that is not controlled with medicine.  This information is not intended to replace advice given to you by your health care provider. Make sure you discuss any questions you have with your health care provider. Document  Released: 06/09/2005 Document Revised: 11/15/2015 Document Reviewed: 02/14/2013 Elsevier Interactive Patient Education  2017 [employer]  

## 2017-11-05 NOTE — Progress Notes (Signed)
Hematology and Oncology Follow Up Visit  Michelle Bowen [id number] 03-May-1941 77 y.o. 11/05/2017   Principle Diagnosis:  Metastatic breast cancer - RIGHT - bone mets - TRIPLE POSITIVE  Current Therapy:   Herceptin 360 mg IV q 3 week - s/p cycle 9 Femara 2.5 mg po q day Xgeva 120 mg IM q 3 months - due again in June 2019    Interim History:  Michelle Bowen is here today with her friend for follow-up and treatment. She is tolerating treatment well. She has had some mild fatigue at times.  Her CA 27.29 was 20 last month. Today's level is pending.  She has had no issues with fever, chills, n/v, cough, rash, dizziness, SOB, chest pain, palpitations, abdominal pain or changes in bowel or bladder habits.  No swelling or tenderness in her extremities at this time. 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 She uses a cane for support when ambulating. No falls or syncopal episodes.  She has the same pain in her left side and lower back. This is unchanged. She is still walking for exercise.  No lymphadenopathy found on exam.  No episodes of bleeding, no bruising or petechiae.  She has maintained a good appetite and is doing [name] to stay well hydrated. Her weight is stable.   ECOG Performance Status: 1 - Symptomatic but completely ambulatory  Medications:  Allergies as of 11/05/2017   No Active Allergies     Medication List        Accurate as of 11/05/17 10:33 AM. Always use your most recent med list.          gabapentin 300 MG capsule Commonly known as:  NEURONTIN Take 1 capsule (300 mg total) by mouth at bedtime.   letrozole 2.5 MG tablet Commonly known as:  FEMARA Take 1 tablet (2.5 mg total) by mouth daily.   Misc. Devices Misc For Bilateral hip pain   oxyCODONE 5 MG immediate release tablet Commonly known as:  Oxy IR/ROXICODONE Take 1 tablet (5 mg total) by mouth every 6 (six) hours as needed for severe pain.   PROBIOTIC  ACIDOPHILUS PO Take by mouth.   triamterene-hydrochlorothiazide 37.5-25 MG capsule Commonly known as:  DYAZIDE Take 1 capsule by mouth daily.   vitamin B-12 500 MCG tablet Commonly known as:  CYANOCOBALAMIN Take 500 mcg by mouth daily.   Vitamin D3 5000 units Tabs Take by mouth.       Allergies: No Active Allergies  Past Medical History, Surgical history, Social history, and Family History were reviewed and updated.  Review of Systems: All other 10 point review of systems is negative.   Physical Exam:  weight is 143 lb (64.9 kg). Her oral temperature is 97.8 F (36.6 C). Her blood pressure is 182/72 (abnormal) and her pulse is 70. Her respiration is 16 and oxygen saturation is 100%.   Wt Readings from Last 3 Encounters:  11/05/17 143 lb (64.9 kg)  10/15/17 140 lb (63.5 kg)  09/24/17 136 lb (61.7 kg)    Ocular: Sclerae unicteric, pupils equal, round and reactive to light Ear-nose-throat: Oropharynx clear, dentition fair Lymphatic: No cervical, supraclavicular or axillary adenopathy Lungs no rales or rhonchi, good excursion bilaterally Heart regular rate and rhythm, no murmur appreciated Abd soft, nontender, positive bowel sounds, no liver or spleen tip palpated on exam, no fluid wave  MSK no focal spinal tenderness, no joint edema Neuro: non-focal, well-oriented, appropriate affect  Breasts: Deferred   Lab Results  Component Value Date   WBC 4.1 11/05/2017   HGB 9.8 (L) 11/05/2017   HCT 31.1 (L) 11/05/2017   MCV 89.6 11/05/2017   PLT 243 11/05/2017   No results found for: FERRITIN, IRON, TIBC, UIBC, IRONPCTSAT Lab Results  Component Value Date   RBC 3.47 (L) 11/05/2017   No results found for: KPAFRELGTCHN, LAMBDASER, KAPLAMBRATIO No results found for: IGGSERUM, IGA, IGMSERUM No results found for: Odetta Pink, SPEI   Chemistry      Component Value Date/Time   NA 145 10/15/2017 1055   NA 147 (H)  06/11/2017 1345   NA 139 04/23/2017 1256   K 3.5 10/15/2017 1055   K 4.3 06/11/2017 1345   K 4.2 04/23/2017 1256   CL 104 10/15/2017 1055   CL 106 06/11/2017 1345   CO2 30 10/15/2017 1055   CO2 27 06/11/2017 1345   CO2 28 04/23/2017 1256   BUN 21 10/15/2017 1055   BUN 13 06/11/2017 1345   BUN 10.0 04/23/2017 1256   CREATININE 1.00 10/15/2017 1055   CREATININE 1.1 06/11/2017 1345   CREATININE 0.8 04/23/2017 1256      Component Value Date/Time   CALCIUM 9.7 10/15/2017 1055   CALCIUM 8.9 06/11/2017 1345   CALCIUM 8.7 04/23/2017 1256   ALKPHOS 72 10/15/2017 1055   ALKPHOS 85 (H) 06/11/2017 1345   ALKPHOS 104 04/23/2017 1256   AST 24 10/15/2017 1055   AST 15 04/23/2017 1256   ALT 21 10/15/2017 1055   ALT 17 06/11/2017 1345   ALT <6 04/23/2017 1256   BILITOT 1.0 10/15/2017 1055   BILITOT 0.59 04/23/2017 1256       Impression and Plan: Michelle Bowen is a very pleasant 77 yo African American female with metastatic breast cancer, triple positive. She continues to tolerate treatment well. We will proceed with Herceptin today as planned.  She will continue her same regimen with Femara.  We will plan to see her back in another 3 weeks for follow-up. She will contact our office with any questions or concerns. We can certainly see her sooner if need be.   Laverna Peace, NP 5/16/201910:33 AM

## 2017-11-06 LAB — IRON AND TIBC
Iron: 48 ug/dL (ref 41–142)
SATURATION RATIOS: 12 % — AB (ref 21–57)
TIBC: 413 ug/dL (ref 236–444)
UIBC: 364 ug/dL

## 2017-11-06 LAB — CANCER ANTIGEN 27.29: CAN 27.29: 19.1 U/mL (ref 0.0–38.6)

## 2017-11-06 LAB — FERRITIN: Ferritin: 13 ng/mL (ref 9–269)

## 2017-11-12 ENCOUNTER — Other Ambulatory Visit: Payer: Self-pay | Admitting: Family

## 2017-11-12 DIAGNOSIS — C7951 Secondary malignant neoplasm of bone: Secondary | ICD-10-CM

## 2017-11-12 DIAGNOSIS — Z170 Estrogen receptor positive status [ER+]: Principal | ICD-10-CM

## 2017-11-12 DIAGNOSIS — C50411 Malignant neoplasm of upper-outer quadrant of right female breast: Secondary | ICD-10-CM

## 2017-11-26 ENCOUNTER — Other Ambulatory Visit: Payer: Self-pay

## 2017-11-26 ENCOUNTER — Inpatient Hospital Stay: Payer: Medicare Other | Attending: Hematology & Oncology | Admitting: Family

## 2017-11-26 ENCOUNTER — Inpatient Hospital Stay: Payer: Medicare Other

## 2017-11-26 ENCOUNTER — Encounter: Payer: Self-pay | Admitting: Family

## 2017-11-26 VITALS — BP 189/87 | HR 72 | Temp 98.1°F | Resp 17 | Wt 143.0 lb

## 2017-11-26 DIAGNOSIS — C50411 Malignant neoplasm of upper-outer quadrant of right female breast: Secondary | ICD-10-CM

## 2017-11-26 DIAGNOSIS — C50911 Malignant neoplasm of unspecified site of right female breast: Secondary | ICD-10-CM | POA: Insufficient documentation

## 2017-11-26 DIAGNOSIS — D508 Other iron deficiency anemias: Secondary | ICD-10-CM

## 2017-11-26 DIAGNOSIS — C7951 Secondary malignant neoplasm of bone: Secondary | ICD-10-CM | POA: Diagnosis not present

## 2017-11-26 DIAGNOSIS — Z5112 Encounter for antineoplastic immunotherapy: Secondary | ICD-10-CM | POA: Insufficient documentation

## 2017-11-26 DIAGNOSIS — Z170 Estrogen receptor positive status [ER+]: Secondary | ICD-10-CM | POA: Insufficient documentation

## 2017-11-26 DIAGNOSIS — I427 Cardiomyopathy due to drug and external agent: Secondary | ICD-10-CM

## 2017-11-26 LAB — CBC WITH DIFFERENTIAL (CANCER CENTER ONLY)
Basophils Absolute: 0 10*3/uL (ref 0.0–0.1)
Basophils Relative: 0 %
EOS ABS: 0.1 10*3/uL (ref 0.0–0.5)
Eosinophils Relative: 1 %
HEMATOCRIT: 32.9 % — AB (ref 34.8–46.6)
HEMOGLOBIN: 10.4 g/dL — AB (ref 11.6–15.9)
LYMPHS ABS: 0.9 10*3/uL (ref 0.9–3.3)
LYMPHS PCT: 26 %
MCH: 27.7 pg (ref 26.0–34.0)
MCHC: 31.6 g/dL — AB (ref 32.0–36.0)
MCV: 87.7 fL (ref 81.0–101.0)
MONOS PCT: 12 %
Monocytes Absolute: 0.4 10*3/uL (ref 0.1–0.9)
NEUTROS PCT: 61 %
Neutro Abs: 2.2 10*3/uL (ref 1.5–6.5)
Platelet Count: 252 10*3/uL (ref 145–400)
RBC: 3.75 MIL/uL (ref 3.70–5.32)
RDW: 13.4 % (ref 11.1–15.7)
WBC Count: 3.6 10*3/uL — ABNORMAL LOW (ref 3.9–10.0)

## 2017-11-26 LAB — CMP (CANCER CENTER ONLY)
ALK PHOS: 66 U/L (ref 26–84)
ALT: 18 U/L (ref 10–47)
ANION GAP: 12 (ref 5–15)
AST: 28 U/L (ref 11–38)
Albumin: 3.9 g/dL (ref 3.5–5.0)
BILIRUBIN TOTAL: 1 mg/dL (ref 0.2–1.6)
BUN: 18 mg/dL (ref 7–22)
CHLORIDE: 103 mmol/L (ref 98–108)
CO2: 28 mmol/L (ref 18–33)
Calcium: 9.3 mg/dL (ref 8.0–10.3)
Creatinine: 0.9 mg/dL (ref 0.60–1.20)
Glucose, Bld: 94 mg/dL (ref 73–118)
POTASSIUM: 3.8 mmol/L (ref 3.3–4.7)
Sodium: 143 mmol/L (ref 128–145)
TOTAL PROTEIN: 8.3 g/dL — AB (ref 6.4–8.1)

## 2017-11-26 MED ORDER — TRASTUZUMAB CHEMO 150 MG IV SOLR
6.0000 mg/kg | Freq: Once | INTRAVENOUS | Status: AC
  Administered 2017-11-26: 357 mg via INTRAVENOUS
  Filled 2017-11-26: qty 17

## 2017-11-26 MED ORDER — DENOSUMAB 120 MG/1.7ML ~~LOC~~ SOLN
120.0000 mg | Freq: Once | SUBCUTANEOUS | Status: AC
  Administered 2017-11-26: 120 mg via SUBCUTANEOUS

## 2017-11-26 MED ORDER — HEPARIN SOD (PORK) LOCK FLUSH 100 UNIT/ML IV SOLN
500.0000 [IU] | Freq: Once | INTRAVENOUS | Status: AC | PRN
  Administered 2017-11-26: 500 [IU]
  Filled 2017-11-26: qty 5

## 2017-11-26 MED ORDER — DENOSUMAB 120 MG/1.7ML ~~LOC~~ SOLN
SUBCUTANEOUS | Status: AC
  Filled 2017-11-26: qty 1.7

## 2017-11-26 MED ORDER — DIPHENHYDRAMINE HCL 50 MG/ML IJ SOLN
12.5000 mg | Freq: Once | INTRAMUSCULAR | Status: AC
  Administered 2017-11-26: 12.5 mg via INTRAVENOUS

## 2017-11-26 MED ORDER — SODIUM CHLORIDE 0.9% FLUSH
10.0000 mL | INTRAVENOUS | Status: DC | PRN
  Administered 2017-11-26: 10 mL
  Filled 2017-11-26: qty 10

## 2017-11-26 MED ORDER — DIPHENHYDRAMINE HCL 50 MG/ML IJ SOLN
INTRAMUSCULAR | Status: AC
  Filled 2017-11-26: qty 1

## 2017-11-26 MED ORDER — SODIUM CHLORIDE 0.9 % IV SOLN
Freq: Once | INTRAVENOUS | Status: AC
  Administered 2017-11-26: 12:00:00 via INTRAVENOUS

## 2017-11-26 MED ORDER — SODIUM CHLORIDE 0.9 % IV SOLN
20.0000 mg | Freq: Once | INTRAVENOUS | Status: AC
  Administered 2017-11-26: 20 mg via INTRAVENOUS
  Filled 2017-11-26: qty 2

## 2017-11-26 NOTE — Patient Instructions (Addendum)
Trastuzumab injection for infusion What is this medicine? TRASTUZUMAB (tras TOO zoo mab) is a monoclonal antibody. It is used to treat breast cancer and stomach cancer. This medicine may be used for other purposes; ask your health care provider or pharmacist if you have questions. COMMON BRAND NAME(S): Herceptin What should I tell my health care provider before I take this medicine? They need to know if you have any of these conditions: -heart disease -heart failure -lung or breathing disease, like asthma -an unusual or allergic reaction to trastuzumab, benzyl alcohol, or other medications, foods, dyes, or preservatives -pregnant or trying to get pregnant -breast-feeding How should I use this medicine? This drug is given as an infusion into a vein. It is administered in a hospital or clinic by a specially trained health care professional. Talk to your pediatrician regarding the use of this medicine in children. This medicine is not approved for use in children. Overdosage: If you think you have taken too much of this medicine contact a poison control center or emergency room at once. NOTE: This medicine is only for you. Do not share this medicine with others. What if I miss a dose? It is important not to miss a dose. Call your doctor or health care professional if you are unable to keep an appointment. What may interact with this medicine? This medicine may interact with the following medications: -certain types of chemotherapy, such as daunorubicin, doxorubicin, epirubicin, and idarubicin This list may not describe all possible interactions. Give your health care provider a list of all the medicines, herbs, non-prescription drugs, or dietary supplements you use. Also tell them if you smoke, drink alcohol, or use illegal drugs. Some items may interact with your medicine. What should I watch for while using this medicine? Visit your doctor for checks on your progress. Report any side effects.  Continue your course of treatment even though you feel ill unless your doctor tells you to stop. Call your doctor or health care professional for advice if you get a fever, chills or sore throat, or other symptoms of a cold or flu. Do not treat yourself. Try to avoid being around people who are sick. You may experience fever, chills and shaking during your first infusion. These effects are usually mild and can be treated with other medicines. Report any side effects during the infusion to your health care professional. Fever and chills usually do not happen with later infusions. Do not become pregnant while taking this medicine or for 7 months after stopping it. Women should inform their doctor if they wish to become pregnant or think they might be pregnant. Women of child-bearing potential will need to have a negative pregnancy test before starting this medicine. There is a potential for serious side effects to an unborn child. Talk to your health care professional or pharmacist for more information. Do not breast-feed an infant while taking this medicine or for 7 months after stopping it. Women must use effective birth control with this medicine. What side effects may I notice from receiving this medicine? Side effects that you should report to your doctor or health care professional as soon as possible: -allergic reactions like skin rash, itching or hives, swelling of the face, lips, or tongue -chest pain or palpitations -cough -dizziness -feeling faint or lightheaded, falls -fever -general ill feeling or flu-like symptoms -signs of worsening heart failure like breathing problems; swelling in your legs and feet -unusually weak or tired Side effects that usually do not require medical  attention (report to your doctor or health care professional if they continue or are bothersome): -bone pain -changes in taste -diarrhea -joint pain -nausea/vomiting -weight loss This list may not describe all  possible side effects. Call your doctor for medical advice about side effects. You may report side effects to FDA at 1-800-FDA-1088. Where should I keep my medicine? This drug is given in a hospital or clinic and will not be stored at home. NOTE: This sheet is a summary. It may not cover all possible information. If you have questions about this medicine, talk to your doctor, pharmacist, or health care provider.  2018 Elsevier/Gold Standard (2016-06-03 14:37:52) Denosumab injection What is this medicine? DENOSUMAB (den oh sue mab) slows bone breakdown. Prolia is used to treat osteoporosis in women after menopause and in men. Delton See is used to treat a high calcium level due to cancer and to prevent bone fractures and other bone problems caused by multiple myeloma or cancer bone metastases. Delton See is also used to treat giant cell tumor of the bone. This medicine may be used for other purposes; ask your health care provider or pharmacist if you have questions. COMMON BRAND NAME(S): Prolia, XGEVA What should I tell my health care provider before I take this medicine? They need to know if you have any of these conditions: -dental disease -having surgery or tooth extraction -infection -kidney disease -low levels of calcium or Vitamin D in the blood -malnutrition -on hemodialysis -skin conditions or sensitivity -thyroid or parathyroid disease -an unusual reaction to denosumab, other medicines, foods, dyes, or preservatives -pregnant or trying to get pregnant -breast-feeding How should I use this medicine? This medicine is for injection under the skin. It is given by a health care professional in a hospital or clinic setting. If you are getting Prolia, a special MedGuide will be given to you by the pharmacist with each prescription and refill. Be sure to read this information carefully each time. For Prolia, talk to your pediatrician regarding the use of this medicine in children. Special care may  be needed. For Delton See, talk to your pediatrician regarding the use of this medicine in children. While this drug may be prescribed for children as young as 13 years for selected conditions, precautions do apply. Overdosage: If you think you have taken too much of this medicine contact a poison control center or emergency room at once. NOTE: This medicine is only for you. Do not share this medicine with others. What if I miss a dose? It is important not to miss your dose. Call your doctor or health care professional if you are unable to keep an appointment. What may interact with this medicine? Do not take this medicine with any of the following medications: -other medicines containing denosumab This medicine may also interact with the following medications: -medicines that lower your chance of fighting infection -steroid medicines like prednisone or cortisone This list may not describe all possible interactions. Give your health care provider a list of all the medicines, herbs, non-prescription drugs, or dietary supplements you use. Also tell them if you smoke, drink alcohol, or use illegal drugs. Some items may interact with your medicine. What should I watch for while using this medicine? Visit your doctor or health care professional for regular checks on your progress. Your doctor or health care professional may order blood tests and other tests to see how you are doing. Call your doctor or health care professional for advice if you get a fever, chills or sore  throat, or other symptoms of a cold or flu. Do not treat yourself. This drug may decrease your body's ability to fight infection. Try to avoid being around people who are sick. You should make sure you get enough calcium and vitamin D while you are taking this medicine, unless your doctor tells you not to. Discuss the foods you eat and the vitamins you take with your health care professional. See your dentist regularly. Brush and floss your  teeth as directed. Before you have any dental work done, tell your dentist you are receiving this medicine. Do not become pregnant while taking this medicine or for 5 months after stopping it. Talk with your doctor or health care professional about your birth control options while taking this medicine. Women should inform their doctor if they wish to become pregnant or think they might be pregnant. There is a potential for serious side effects to an unborn child. Talk to your health care professional or pharmacist for more information. What side effects may I notice from receiving this medicine? Side effects that you should report to your doctor or health care professional as soon as possible: -allergic reactions like skin rash, itching or hives, swelling of the face, lips, or tongue -bone pain -breathing problems -dizziness -jaw pain, especially after dental work -redness, blistering, peeling of the skin -signs and symptoms of infection like fever or chills; cough; sore throat; pain or trouble passing urine -signs of low calcium like fast heartbeat, muscle cramps or muscle pain; pain, tingling, numbness in the hands or feet; seizures -unusual bleeding or bruising -unusually weak or tired Side effects that usually do not require medical attention (report to your doctor or health care professional if they continue or are bothersome): -constipation -diarrhea -headache -joint pain -loss of appetite -muscle pain -runny nose -tiredness -upset stomach This list may not describe all possible side effects. Call your doctor for medical advice about side effects. You may report side effects to FDA at 1-800-FDA-1088. Where should I keep my medicine? This medicine is only given in a clinic, doctor's office, or other health care setting and will not be stored at home. NOTE: This sheet is a summary. It may not cover all possible information. If you have questions about this medicine, talk to your  doctor, pharmacist, or health care provider.  2018 Elsevier/Gold Standard (2016-07-01 19:17:21)

## 2017-11-26 NOTE — Progress Notes (Signed)
Hematology and Oncology Follow Up Visit  Michelle Bowen [id number] 1940-09-23 77 y.o. 11/26/2017   Principle Diagnosis:  Metastatic breast cancer - RIGHT - bone mets - TRIPLE POSITIVE  Current Therapy:   Herceptin 360 mg IV q 3 week - s/p cycle 10 Femara 2.5 mg po q day Xgeva 120 mg IM q 3 months - due again in June 2019   Interim History: Michelle Bowen is here today with her friend for follow-up. She continues to do well and has no new complaints at this time.  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 This has not effected her balance or gait. No falls or syncopal episodes.  She has occasional hot flashes and itching with the heat. No rashes.  She verbalized that she is taking her Femara as prescribed.  CA 27.29 in May was 19.1.  No changes on breast exam today. No lymphadenopathy noted.  No fever, chills, n/v, cough, dizziness, SOB, chest pain, palpitations, abdominal pain or changes in bowel or bladder habits.  She has had no episodes of bleeding. No bruising or petechiae.  No swelling in her extremities. She does have more limited ROM in the right arm. She has exercises that she does to help with this.  She has maintained a good appetite and is staying well hydrated. Her weight is stable.   ECOG Performance Status: 1 - Symptomatic but completely ambulatory  Medications:  Allergies as of 11/26/2017   No Active Allergies     Medication List        Accurate as of 11/26/17 11:06 AM. Always use your most recent med list.          gabapentin 300 MG capsule Commonly known as:  NEURONTIN Take 1 capsule (300 mg total) by mouth at bedtime.   letrozole 2.5 MG tablet Commonly known as:  FEMARA Take 1 tablet (2.5 mg total) by mouth daily.   Misc. Devices Misc For Bilateral hip pain   oxyCODONE 5 MG immediate release tablet Commonly known as:  Oxy IR/ROXICODONE Take 1 tablet (5 mg total) by mouth every 6 (six) hours as needed for severe pain.   PROBIOTIC  ACIDOPHILUS PO Take by mouth.   triamterene-hydrochlorothiazide 37.5-25 MG capsule Commonly known as:  DYAZIDE Take 1 capsule by mouth daily.   vitamin B-12 500 MCG tablet Commonly known as:  CYANOCOBALAMIN Take 500 mcg by mouth daily.   Vitamin D3 5000 units Tabs Take by mouth.       Allergies: No Active Allergies  Past Medical History, Surgical history, Social history, and Family History were reviewed and updated.  Review of Systems: All other 10 point review of systems is negative.   Physical Exam:  weight is 143 lb (64.9 kg). Her oral temperature is 98.1 F (36.7 C). Her blood pressure is 189/87 (abnormal) and her pulse is 72. Her respiration is 17 and oxygen saturation is 100%.   Wt Readings from Last 3 Encounters:  11/26/17 143 lb (64.9 kg)  11/05/17 143 lb (64.9 kg)  10/15/17 140 lb (63.5 kg)    Ocular: Sclerae unicteric, pupils equal, round and reactive to light Ear-nose-throat: Oropharynx clear, dentition fair Lymphatic: No cervical, supraclavicular or axillary adenopathy Lungs no rales or rhonchi, good excursion bilaterally Heart regular rate and rhythm, no murmur appreciated Abd soft, nontender, positive bowel sounds, no liver or spleen tip palpated on exam, no fluid wave  MSK no focal spinal tenderness, no joint edema Neuro: non-focal, well-oriented, appropriate affect  Breasts: Deferred   Lab Results  Component Value Date   WBC 3.6 (L) 11/26/2017   HGB 10.4 (L) 11/26/2017   HCT 32.9 (L) 11/26/2017   MCV 87.7 11/26/2017   PLT 252 11/26/2017   Lab Results  Component Value Date   FERRITIN 13 11/05/2017   IRON 48 11/05/2017   TIBC 413 11/05/2017   UIBC 364 11/05/2017   IRONPCTSAT 12 (L) 11/05/2017   Lab Results  Component Value Date   RBC 3.75 11/26/2017   No results found for: KPAFRELGTCHN, LAMBDASER, KAPLAMBRATIO No results found for: IGGSERUM, IGA, IGMSERUM No results found for: Odetta Pink, SPEI   Chemistry      Component Value Date/Time   NA 145 11/05/2017 1000   NA 147 (H) 06/11/2017 1345   NA 139 04/23/2017 1256   K 3.5 11/05/2017 1000   K 4.3 06/11/2017 1345   K 4.2 04/23/2017 1256   CL 104 11/05/2017 1000   CL 106 06/11/2017 1345   CO2 28 11/05/2017 1000   CO2 27 06/11/2017 1345   CO2 28 04/23/2017 1256   BUN 20 11/05/2017 1000   BUN 13 06/11/2017 1345   BUN 10.0 04/23/2017 1256   CREATININE 1.20 11/05/2017 1000   CREATININE 1.1 06/11/2017 1345   CREATININE 0.8 04/23/2017 1256      Component Value Date/Time   CALCIUM 9.1 11/05/2017 1000   CALCIUM 8.9 06/11/2017 1345   CALCIUM 8.7 04/23/2017 1256   ALKPHOS 63 11/05/2017 1000   ALKPHOS 85 (H) 06/11/2017 1345   ALKPHOS 104 04/23/2017 1256   AST 23 11/05/2017 1000   AST 15 04/23/2017 1256   ALT 20 11/05/2017 1000   ALT 17 06/11/2017 1345   ALT <6 04/23/2017 1256   BILITOT 0.9 11/05/2017 1000   BILITOT 0.59 04/23/2017 1256      Impression and Plan: Michelle Bowen is a very pleasant 77 yo African American female with metastatic breast cancer, triple positive. She continues to do well and will proceed with Herceptin today as planned.  She is scheduled for CT scans next week and will schedule her ECHO today before she leaves.  She will continue her same regimen with Femara.  We will plan to see her back in another 3 weeks for MD follow-up and treatment.  She will contact our office with any questions or concerns. We can certainly see her sooner if need be.   Michelle Peace, NP 6/6/201911:06 AM

## 2017-11-26 NOTE — Patient Instructions (Signed)
Implanted Port Home Guide An implanted port is a type of central line that is placed under the skin. Central lines are used to provide IV access when treatment or nutrition needs to be given through a person's veins. Implanted ports are used for long-term IV access. An implanted port may be placed because:  You need IV medicine that would be irritating to the small veins in your hands or arms.  You need long-term IV medicines, such as antibiotics.  You need IV nutrition for a long period.  You need frequent blood draws for lab tests.  You need dialysis.  Implanted ports are usually placed in the chest area, but they can also be placed in the upper arm, the abdomen, or the leg. An implanted port has two main parts:  Reservoir. The reservoir is round and will appear as a small, raised area under your skin. The reservoir is the part where a needle is inserted to give medicines or draw blood.  Catheter. The catheter is a thin, flexible tube that extends from the reservoir. The catheter is placed into a large vein. Medicine that is inserted into the reservoir goes into the catheter and then into the vein.  How will I care for my incision site? Do not get the incision site wet. Bathe or shower as directed by your health care provider. How is my port accessed? Special steps must be taken to access the port:  Before the port is accessed, a numbing cream can be placed on the skin. This helps numb the skin over the port site.  Your health care provider uses a sterile technique to access the port. ? Your health care provider must put on a mask and sterile gloves. ? The skin over your port is cleaned carefully with an antiseptic and allowed to dry. ? The port is gently pinched between sterile gloves, and a needle is inserted into the port.  Only "non-coring" port needles should be used to access the port. Once the port is accessed, a blood return should be checked. This helps ensure that the port  is in the vein and is not clogged.  If your port needs to remain accessed for a constant infusion, a clear (transparent) bandage will be placed over the needle site. The bandage and needle will need to be changed every week, or as directed by your health care provider.  Keep the bandage covering the needle clean and dry. Do not get it wet. Follow your health care provider's instructions on how to take a shower or bath while the port is accessed.  If your port does not need to stay accessed, no bandage is needed over the port.  What is flushing? Flushing helps keep the port from getting clogged. Follow your health care provider's instructions on how and when to flush the port. Ports are usually flushed with saline solution or a medicine called heparin. The need for flushing will depend on how the port is used.  If the port is used for intermittent medicines or blood draws, the port will need to be flushed: ? After medicines have been given. ? After blood has been drawn. ? As part of routine maintenance.  If a constant infusion is running, the port may not need to be flushed.  How long will my port stay implanted? The port can stay in for as long as your health care provider thinks it is needed. When it is time for the port to come out, surgery will be   done to remove it. The procedure is similar to the one performed when the port was put in. When should I seek immediate medical care? When you have an implanted port, you should seek immediate medical care if:  You notice a bad smell coming from the incision site.  You have swelling, redness, or drainage at the incision site.  You have more swelling or pain at the port site or the surrounding area.  You have a fever that is not controlled with medicine.  This information is not intended to replace advice given to you by your health care provider. Make sure you discuss any questions you have with your health care provider. Document  Released: 06/09/2005 Document Revised: 11/15/2015 Document Reviewed: 02/14/2013 Elsevier Interactive Patient Education  2017 [employer]  

## 2017-11-27 ENCOUNTER — Other Ambulatory Visit: Payer: Self-pay | Admitting: Family

## 2017-11-27 LAB — IRON AND TIBC
IRON: 54 ug/dL (ref 41–142)
Saturation Ratios: 13 % — ABNORMAL LOW (ref 21–57)
TIBC: 424 ug/dL (ref 236–444)
UIBC: 370 ug/dL

## 2017-11-27 LAB — FERRITIN: Ferritin: 13 ng/mL (ref 9–269)

## 2017-11-27 LAB — CANCER ANTIGEN 27.29: CAN 27.29: 23.3 U/mL (ref 0.0–38.6)

## 2017-12-01 ENCOUNTER — Other Ambulatory Visit (HOSPITAL_COMMUNITY): Payer: Medicare Other

## 2017-12-02 ENCOUNTER — Ambulatory Visit (HOSPITAL_BASED_OUTPATIENT_CLINIC_OR_DEPARTMENT_OTHER): Admission: RE | Admit: 2017-12-02 | Payer: Medicare Other | Source: Ambulatory Visit

## 2017-12-02 ENCOUNTER — Ambulatory Visit (HOSPITAL_BASED_OUTPATIENT_CLINIC_OR_DEPARTMENT_OTHER): Payer: Medicare Other

## 2017-12-09 ENCOUNTER — Encounter (HOSPITAL_BASED_OUTPATIENT_CLINIC_OR_DEPARTMENT_OTHER): Payer: Self-pay

## 2017-12-09 ENCOUNTER — Ambulatory Visit (HOSPITAL_BASED_OUTPATIENT_CLINIC_OR_DEPARTMENT_OTHER)
Admission: RE | Admit: 2017-12-09 | Discharge: 2017-12-09 | Disposition: A | Discharge status: Home / Self Care | Payer: Medicare Other | Source: Ambulatory Visit | Attending: Family | Admitting: Family

## 2017-12-09 ENCOUNTER — Inpatient Hospital Stay: Payer: Medicare Other

## 2017-12-09 DIAGNOSIS — R9389 Abnormal findings on diagnostic imaging of other specified body structures: Secondary | ICD-10-CM | POA: Diagnosis not present

## 2017-12-09 DIAGNOSIS — R911 Solitary pulmonary nodule: Secondary | ICD-10-CM | POA: Diagnosis not present

## 2017-12-09 DIAGNOSIS — Z170 Estrogen receptor positive status [ER+]: Principal | ICD-10-CM

## 2017-12-09 DIAGNOSIS — C7951 Secondary malignant neoplasm of bone: Secondary | ICD-10-CM | POA: Insufficient documentation

## 2017-12-09 DIAGNOSIS — C50411 Malignant neoplasm of upper-outer quadrant of right female breast: Secondary | ICD-10-CM | POA: Diagnosis not present

## 2017-12-09 DIAGNOSIS — R918 Other nonspecific abnormal finding of lung field: Secondary | ICD-10-CM | POA: Insufficient documentation

## 2017-12-09 MED ORDER — IOPAMIDOL (ISOVUE-300) INJECTION 61%
100.0000 mL | Freq: Once | INTRAVENOUS | Status: AC | PRN
  Administered 2017-12-09: 100 mL via INTRAVENOUS

## 2017-12-10 ENCOUNTER — Telehealth: Payer: Self-pay | Admitting: *Deleted

## 2017-12-10 NOTE — Telephone Encounter (Addendum)
Patient is aware of results.   ----- Message from Volanda Napoleon, MD sent at 12/09/2017  2:37 PM EDT ----- Call - NO obvious cancer on the CT scan. Laurey Arrow

## 2017-12-17 ENCOUNTER — Encounter: Payer: Self-pay | Admitting: Hematology & Oncology

## 2017-12-17 ENCOUNTER — Inpatient Hospital Stay: Payer: Medicare Other

## 2017-12-17 ENCOUNTER — Other Ambulatory Visit: Payer: Self-pay

## 2017-12-17 ENCOUNTER — Inpatient Hospital Stay (HOSPITAL_BASED_OUTPATIENT_CLINIC_OR_DEPARTMENT_OTHER): Payer: Medicare Other | Admitting: Hematology & Oncology

## 2017-12-17 VITALS — BP 186/72 | HR 63 | Temp 98.4°F | Resp 17 | Wt 144.0 lb

## 2017-12-17 DIAGNOSIS — C7951 Secondary malignant neoplasm of bone: Secondary | ICD-10-CM

## 2017-12-17 DIAGNOSIS — C50411 Malignant neoplasm of upper-outer quadrant of right female breast: Secondary | ICD-10-CM

## 2017-12-17 DIAGNOSIS — Z170 Estrogen receptor positive status [ER+]: Secondary | ICD-10-CM | POA: Diagnosis not present

## 2017-12-17 DIAGNOSIS — C50911 Malignant neoplasm of unspecified site of right female breast: Secondary | ICD-10-CM

## 2017-12-17 DIAGNOSIS — D508 Other iron deficiency anemias: Secondary | ICD-10-CM

## 2017-12-17 DIAGNOSIS — Z5112 Encounter for antineoplastic immunotherapy: Secondary | ICD-10-CM | POA: Diagnosis not present

## 2017-12-17 LAB — CBC WITH DIFFERENTIAL (CANCER CENTER ONLY)
BASOS PCT: 0 %
Basophils Absolute: 0 10*3/uL (ref 0.0–0.1)
Eosinophils Absolute: 0.1 10*3/uL (ref 0.0–0.5)
Eosinophils Relative: 2 %
HCT: 31.8 % — ABNORMAL LOW (ref 34.8–46.6)
HEMOGLOBIN: 10 g/dL — AB (ref 11.6–15.9)
LYMPHS PCT: 31 %
Lymphs Abs: 1.1 10*3/uL (ref 0.9–3.3)
MCH: 27.7 pg (ref 26.0–34.0)
MCHC: 31.4 g/dL — AB (ref 32.0–36.0)
MCV: 88.1 fL (ref 81.0–101.0)
MONO ABS: 0.3 10*3/uL (ref 0.1–0.9)
MONOS PCT: 10 %
NEUTROS ABS: 2 10*3/uL (ref 1.5–6.5)
NEUTROS PCT: 57 %
Platelet Count: 236 10*3/uL (ref 145–400)
RBC: 3.61 MIL/uL — ABNORMAL LOW (ref 3.70–5.32)
RDW: 13.6 % (ref 11.1–15.7)
WBC Count: 3.5 10*3/uL — ABNORMAL LOW (ref 3.9–10.0)

## 2017-12-17 LAB — CMP (CANCER CENTER ONLY)
ALBUMIN: 3.8 g/dL (ref 3.5–5.0)
ALT: 15 U/L (ref 10–47)
ANION GAP: 12 (ref 5–15)
AST: 25 U/L (ref 11–38)
Alkaline Phosphatase: 58 U/L (ref 26–84)
BUN: 17 mg/dL (ref 7–22)
CO2: 29 mmol/L (ref 18–33)
Calcium: 9.3 mg/dL (ref 8.0–10.3)
Chloride: 102 mmol/L (ref 98–108)
Creatinine: 1 mg/dL (ref 0.60–1.20)
Glucose, Bld: 86 mg/dL (ref 73–118)
POTASSIUM: 3.8 mmol/L (ref 3.3–4.7)
Sodium: 143 mmol/L (ref 128–145)
TOTAL PROTEIN: 7.6 g/dL (ref 6.4–8.1)
Total Bilirubin: 0.9 mg/dL (ref 0.2–1.6)

## 2017-12-17 MED ORDER — HEPARIN SOD (PORK) LOCK FLUSH 100 UNIT/ML IV SOLN
500.0000 [IU] | Freq: Once | INTRAVENOUS | Status: AC | PRN
  Administered 2017-12-17: 500 [IU]
  Filled 2017-12-17: qty 5

## 2017-12-17 MED ORDER — DIPHENHYDRAMINE HCL 50 MG/ML IJ SOLN
12.5000 mg | Freq: Once | INTRAMUSCULAR | Status: AC
  Administered 2017-12-17: 12.5 mg via INTRAVENOUS

## 2017-12-17 MED ORDER — TRASTUZUMAB CHEMO 150 MG IV SOLR
6.0000 mg/kg | Freq: Once | INTRAVENOUS | Status: AC
  Administered 2017-12-17: 357 mg via INTRAVENOUS
  Filled 2017-12-17: qty 17

## 2017-12-17 MED ORDER — DIPHENHYDRAMINE HCL 50 MG/ML IJ SOLN
INTRAMUSCULAR | Status: AC
  Filled 2017-12-17: qty 1

## 2017-12-17 MED ORDER — SODIUM CHLORIDE 0.9% FLUSH
10.0000 mL | INTRAVENOUS | Status: DC | PRN
  Administered 2017-12-17: 10 mL
  Filled 2017-12-17: qty 10

## 2017-12-17 MED ORDER — SODIUM CHLORIDE 0.9 % IV SOLN
20.0000 mg | Freq: Once | INTRAVENOUS | Status: AC
  Administered 2017-12-17: 20 mg via INTRAVENOUS
  Filled 2017-12-17: qty 2

## 2017-12-17 MED ORDER — SODIUM CHLORIDE 0.9 % IV SOLN
Freq: Once | INTRAVENOUS | Status: AC
  Administered 2017-12-17: 12:00:00 via INTRAVENOUS

## 2017-12-17 NOTE — Patient Instructions (Signed)
Trastuzumab injection for infusion What is this medicine? TRASTUZUMAB (tras TOO zoo mab) is a monoclonal antibody. It is used to treat breast cancer and stomach cancer. This medicine may be used for other purposes; ask your health care provider or pharmacist if you have questions. COMMON BRAND NAME(S): Herceptin What should I tell my health care provider before I take this medicine? They need to know if you have any of these conditions: -heart disease -heart failure -lung or breathing disease, like asthma -an unusual or allergic reaction to trastuzumab, benzyl alcohol, or other medications, foods, dyes, or preservatives -pregnant or trying to get pregnant -breast-feeding How should I use this medicine? This drug is given as an infusion into a vein. It is administered in a hospital or clinic by a specially trained health care professional. Talk to your pediatrician regarding the use of this medicine in children. This medicine is not approved for use in children. Overdosage: If you think you have taken too much of this medicine contact a poison control center or emergency room at once. NOTE: This medicine is only for you. Do not share this medicine with others. What if I miss a dose? It is important not to miss a dose. Call your doctor or health care professional if you are unable to keep an appointment. What may interact with this medicine? This medicine may interact with the following medications: -certain types of chemotherapy, such as daunorubicin, doxorubicin, epirubicin, and idarubicin This list may not describe all possible interactions. Give your health care provider a list of all the medicines, herbs, non-prescription drugs, or dietary supplements you use. Also tell them if you smoke, drink alcohol, or use illegal drugs. Some items may interact with your medicine. What should I watch for while using this medicine? Visit your doctor for checks on your progress. Report any side effects.  Continue your course of treatment even though you feel ill unless your doctor tells you to stop. Call your doctor or health care professional for advice if you get a fever, chills or sore throat, or other symptoms of a cold or flu. Do not treat yourself. Try to avoid being around people who are sick. You may experience fever, chills and shaking during your first infusion. These effects are usually mild and can be treated with other medicines. Report any side effects during the infusion to your health care professional. Fever and chills usually do not happen with later infusions. Do not become pregnant while taking this medicine or for 7 months after stopping it. Women should inform their doctor if they wish to become pregnant or think they might be pregnant. Women of child-bearing potential will need to have a negative pregnancy test before starting this medicine. There is a potential for serious side effects to an unborn child. Talk to your health care professional or pharmacist for more information. Do not breast-feed an infant while taking this medicine or for 7 months after stopping it. Women must use effective birth control with this medicine. What side effects may I notice from receiving this medicine? Side effects that you should report to your doctor or health care professional as soon as possible: -allergic reactions like skin rash, itching or hives, swelling of the face, lips, or tongue -chest pain or palpitations -cough -dizziness -feeling faint or lightheaded, falls -fever -general ill feeling or flu-like symptoms -signs of worsening heart failure like breathing problems; swelling in your legs and feet -unusually weak or tired Side effects that usually do not require medical   attention (report to your doctor or health care professional if they continue or are bothersome): -bone pain -changes in taste -diarrhea -joint pain -nausea/vomiting -weight loss This list may not describe all  possible side effects. Call your doctor for medical advice about side effects. You may report side effects to FDA at 1-800-FDA-1088. Where should I keep my medicine? This drug is given in a hospital or clinic and will not be stored at home. NOTE: This sheet is a summary. It may not cover all possible information. If you have questions about this medicine, talk to your doctor, pharmacist, or health care provider.  2018 Elsevier/Gold Standard (2016-06-03 14:37:52)  

## 2017-12-17 NOTE — Patient Instructions (Signed)
Implanted Port Home Guide An implanted port is a type of central line that is placed under the skin. Central lines are used to provide IV access when treatment or nutrition needs to be given through a person's veins. Implanted ports are used for long-term IV access. An implanted port may be placed because:  You need IV medicine that would be irritating to the small veins in your hands or arms.  You need long-term IV medicines, such as antibiotics.  You need IV nutrition for a long period.  You need frequent blood draws for lab tests.  You need dialysis.  Implanted ports are usually placed in the chest area, but they can also be placed in the upper arm, the abdomen, or the leg. An implanted port has two main parts:  Reservoir. The reservoir is round and will appear as a small, raised area under your skin. The reservoir is the part where a needle is inserted to give medicines or draw blood.  Catheter. The catheter is a thin, flexible tube that extends from the reservoir. The catheter is placed into a large vein. Medicine that is inserted into the reservoir goes into the catheter and then into the vein.  How will I care for my incision site? Do not get the incision site wet. Bathe or shower as directed by your health care provider. How is my port accessed? Special steps must be taken to access the port:  Before the port is accessed, a numbing cream can be placed on the skin. This helps numb the skin over the port site.  Your health care provider uses a sterile technique to access the port. ? Your health care provider must put on a mask and sterile gloves. ? The skin over your port is cleaned carefully with an antiseptic and allowed to dry. ? The port is gently pinched between sterile gloves, and a needle is inserted into the port.  Only "non-coring" port needles should be used to access the port. Once the port is accessed, a blood return should be checked. This helps ensure that the port  is in the vein and is not clogged.  If your port needs to remain accessed for a constant infusion, a clear (transparent) bandage will be placed over the needle site. The bandage and needle will need to be changed every week, or as directed by your health care provider.  Keep the bandage covering the needle clean and dry. Do not get it wet. Follow your health care provider's instructions on how to take a shower or bath while the port is accessed.  If your port does not need to stay accessed, no bandage is needed over the port.  What is flushing? Flushing helps keep the port from getting clogged. Follow your health care provider's instructions on how and when to flush the port. Ports are usually flushed with saline solution or a medicine called heparin. The need for flushing will depend on how the port is used.  If the port is used for intermittent medicines or blood draws, the port will need to be flushed: ? After medicines have been given. ? After blood has been drawn. ? As part of routine maintenance.  If a constant infusion is running, the port may not need to be flushed.  How long will my port stay implanted? The port can stay in for as long as your health care provider thinks it is needed. When it is time for the port to come out, surgery will be   done to remove it. The procedure is similar to the one performed when the port was put in. When should I seek immediate medical care? When you have an implanted port, you should seek immediate medical care if:  You notice a bad smell coming from the incision site.  You have swelling, redness, or drainage at the incision site.  You have more swelling or pain at the port site or the surrounding area.  You have a fever that is not controlled with medicine.  This information is not intended to replace advice given to you by your health care provider. Make sure you discuss any questions you have with your health care provider. Document  Released: 06/09/2005 Document Revised: 11/15/2015 Document Reviewed: 02/14/2013 Elsevier Interactive Patient Education  2017 [employer]  

## 2017-12-17 NOTE — Progress Notes (Signed)
Hematology and Oncology Follow Up Visit  Michelle Bowen [id number] 1941/02/13 77 y.o. 12/17/2017   Principle Diagnosis:  Metastatic breast cancer - RIGHT - bone mets - TRIPLE POSITIVE  Current Therapy:   Herceptin 360 mg IV q 4 week - s/p cycle #11 Femara 2.5 mg po q day Xgeva 120 mg IM q 3 months - due again in June 2019   Interim History: Michelle Bowen is here today with her friend for follow-up.  Everything is going quite well.  She really has no specific complaints.  She has a vegetable and fruit-based diet which she really enjoys.  We did do scans on her.  The CT scans were done on December 09, 2017.  Hopefully, the scans did not show any obvious progressive disease.  She has stable osseous metastasis.  The past liver lesion is no longer seen.  She has a stable 4 mm left lower lobe nodule.  She has had no issues with fever.  She is had no change in bowel or bladder habits.  She has had no nausea or vomiting.  Overall, her last CA 27.29 was stable at 23.  Her performance status is ECOG 1.  Medications:  Allergies as of 12/17/2017   No Active Allergies     Medication List        Accurate as of 12/17/17 11:02 AM. Always use your most recent med list.          gabapentin 300 MG capsule Commonly known as:  NEURONTIN Take 1 capsule (300 mg total) by mouth at bedtime.   letrozole 2.5 MG tablet Commonly known as:  FEMARA Take 1 tablet (2.5 mg total) by mouth daily.   Misc. Devices Misc For Bilateral hip pain   oxyCODONE 5 MG immediate release tablet Commonly known as:  Oxy IR/ROXICODONE Take 1 tablet (5 mg total) by mouth every 6 (six) hours as needed for severe pain.   PROBIOTIC ACIDOPHILUS PO Take by mouth.   triamterene-hydrochlorothiazide 37.5-25 MG capsule Commonly known as:  DYAZIDE Take 1 capsule by mouth daily.   vitamin B-12 500 MCG tablet Commonly known as:  CYANOCOBALAMIN Take 500 mcg by mouth daily.   Vitamin D3 5000 units Tabs Take by mouth.        Allergies: No Active Allergies  Past Medical History, Surgical history, Social history, and Family History were reviewed and updated.  Review of Systems: Review of Systems  Constitutional: Negative.   HENT: Negative.   Eyes: Negative.   Respiratory: Negative.   Cardiovascular: Negative.   Gastrointestinal: Negative.   Genitourinary: Negative.   Musculoskeletal: Negative.   Skin: Negative.   Neurological: Negative.   Endo/Heme/Allergies: Negative.   Psychiatric/Behavioral: Negative.       Physical Exam:  weight is 144 lb (65.3 kg). Her oral temperature is 98.4 F (36.9 C). Her blood pressure is 186/72 (abnormal) and her pulse is 63. Her respiration is 17 and oxygen saturation is 100%.   Wt Readings from Last 3 Encounters:  12/17/17 144 lb (65.3 kg)  11/26/17 143 lb (64.9 kg)  11/05/17 143 lb (64.9 kg)    Physical Exam  Constitutional: She is oriented to person, place, and time.  HENT:  Head: Normocephalic and atraumatic.  Mouth/Throat: Oropharynx is clear and moist.  Eyes: Pupils are equal, round, and reactive to light. EOM are normal.  Neck: Normal range of motion.  Cardiovascular: Normal rate, regular rhythm and normal heart sounds.  Pulmonary/Chest: Effort normal and breath sounds normal.  Abdominal: Soft.  Bowel sounds are normal.  Musculoskeletal: Normal range of motion. She exhibits no edema, tenderness or deformity.  Lymphadenopathy:    She has no cervical adenopathy.  Neurological: She is alert and oriented to person, place, and time.  Skin: Skin is warm and dry. No rash noted. No erythema.  Psychiatric: She has a normal mood and affect. Her behavior is normal. Judgment and thought content normal.  Vitals reviewed.    Lab Results  Component Value Date   WBC 3.6 (L) 11/26/2017   HGB 10.4 (L) 11/26/2017   HCT 32.9 (L) 11/26/2017   MCV 87.7 11/26/2017   PLT 252 11/26/2017   Lab Results  Component Value Date   FERRITIN 13 11/26/2017   IRON 54  11/26/2017   TIBC 424 11/26/2017   UIBC 370 11/26/2017   IRONPCTSAT 13 (L) 11/26/2017   Lab Results  Component Value Date   RBC 3.75 11/26/2017   No results found for: KPAFRELGTCHN, LAMBDASER, KAPLAMBRATIO No results found for: IGGSERUM, IGA, IGMSERUM No results found for: Odetta Pink, SPEI   Chemistry      Component Value Date/Time   NA 143 11/26/2017 1043   NA 147 (H) 06/11/2017 1345   NA 139 04/23/2017 1256   K 3.8 11/26/2017 1043   K 4.3 06/11/2017 1345   K 4.2 04/23/2017 1256   CL 103 11/26/2017 1043   CL 106 06/11/2017 1345   CO2 28 11/26/2017 1043   CO2 27 06/11/2017 1345   CO2 28 04/23/2017 1256   BUN 18 11/26/2017 1043   BUN 13 06/11/2017 1345   BUN 10.0 04/23/2017 1256   CREATININE 0.90 11/26/2017 1043   CREATININE 1.1 06/11/2017 1345   CREATININE 0.8 04/23/2017 1256      Component Value Date/Time   CALCIUM 9.3 11/26/2017 1043   CALCIUM 8.9 06/11/2017 1345   CALCIUM 8.7 04/23/2017 1256   ALKPHOS 66 11/26/2017 1043   ALKPHOS 85 (H) 06/11/2017 1345   ALKPHOS 104 04/23/2017 1256   AST 28 11/26/2017 1043   AST 15 04/23/2017 1256   ALT 18 11/26/2017 1043   ALT 17 06/11/2017 1345   ALT <6 04/23/2017 1256   BILITOT 1.0 11/26/2017 1043   BILITOT 0.59 04/23/2017 1256      Impression and Plan: Michelle Bowen is a very pleasant 77 yo African American female with metastatic breast cancer, triple positive. She continues to do well and will proceed with Herceptin today as planned.    I think that we can move her Herceptin appointments out to every 4 weeks now.  I think this would be very reasonable.  She agrees.  I do not think we need any scans on her probably for about 3 or 4 months.  She will continue to enjoy her vegetarian-based diet.  We will see her back in 4 weeks.  Volanda Napoleon, MD 6/27/201911:02 AM

## 2017-12-18 LAB — IRON AND TIBC
Iron: 57 ug/dL (ref 28–170)
Saturation Ratios: 13 % (ref 10.4–31.8)
TIBC: 447 ug/dL (ref 250–450)
UIBC: 390 ug/dL

## 2017-12-21 ENCOUNTER — Ambulatory Visit (HOSPITAL_BASED_OUTPATIENT_CLINIC_OR_DEPARTMENT_OTHER)
Admission: RE | Admit: 2017-12-21 | Discharge: 2017-12-21 | Disposition: A | Discharge status: Home / Self Care | Payer: Medicare Other | Source: Ambulatory Visit | Attending: Family | Admitting: Family

## 2017-12-21 DIAGNOSIS — I427 Cardiomyopathy due to drug and external agent: Secondary | ICD-10-CM | POA: Insufficient documentation

## 2017-12-21 DIAGNOSIS — I083 Combined rheumatic disorders of mitral, aortic and tricuspid valves: Secondary | ICD-10-CM | POA: Diagnosis not present

## 2017-12-21 DIAGNOSIS — C7951 Secondary malignant neoplasm of bone: Secondary | ICD-10-CM | POA: Diagnosis not present

## 2017-12-21 DIAGNOSIS — I10 Essential (primary) hypertension: Secondary | ICD-10-CM | POA: Insufficient documentation

## 2017-12-21 DIAGNOSIS — I110 Hypertensive heart disease with heart failure: Secondary | ICD-10-CM | POA: Diagnosis not present

## 2017-12-21 DIAGNOSIS — C50411 Malignant neoplasm of upper-outer quadrant of right female breast: Secondary | ICD-10-CM | POA: Diagnosis present

## 2017-12-21 DIAGNOSIS — I509 Heart failure, unspecified: Secondary | ICD-10-CM | POA: Diagnosis not present

## 2017-12-21 NOTE — Progress Notes (Signed)
  Echocardiogram 2D Echocardiogram has been performed.  Olof Marcil T Anacristina Steffek 12/21/2017, 1:39 PM

## 2017-12-22 ENCOUNTER — Telehealth: Payer: Self-pay | Admitting: *Deleted

## 2017-12-22 LAB — FERRITIN: Ferritin: 12 ng/mL (ref 11–307)

## 2017-12-22 NOTE — Telephone Encounter (Signed)
-----   Message from Volanda Napoleon, MD sent at 12/22/2017  1:04 PM EDT ----- Call - the echo shows that your heart is pumping like a champion!!!  pete

## 2018-01-07 ENCOUNTER — Ambulatory Visit: Payer: Medicare Other

## 2018-01-07 ENCOUNTER — Other Ambulatory Visit: Payer: Medicare Other

## 2018-01-07 ENCOUNTER — Ambulatory Visit: Payer: Medicare Other | Admitting: Hematology & Oncology

## 2018-01-14 ENCOUNTER — Inpatient Hospital Stay: Payer: Medicare Other

## 2018-01-14 ENCOUNTER — Other Ambulatory Visit: Payer: Self-pay

## 2018-01-14 ENCOUNTER — Encounter: Payer: Self-pay | Admitting: Family

## 2018-01-14 ENCOUNTER — Inpatient Hospital Stay: Payer: Medicare Other | Attending: Hematology & Oncology

## 2018-01-14 ENCOUNTER — Inpatient Hospital Stay (HOSPITAL_BASED_OUTPATIENT_CLINIC_OR_DEPARTMENT_OTHER): Payer: Medicare Other | Admitting: Family

## 2018-01-14 VITALS — BP 197/81 | HR 66 | Wt 147.0 lb

## 2018-01-14 DIAGNOSIS — C50911 Malignant neoplasm of unspecified site of right female breast: Secondary | ICD-10-CM

## 2018-01-14 DIAGNOSIS — C7951 Secondary malignant neoplasm of bone: Secondary | ICD-10-CM | POA: Insufficient documentation

## 2018-01-14 DIAGNOSIS — C50411 Malignant neoplasm of upper-outer quadrant of right female breast: Secondary | ICD-10-CM

## 2018-01-14 DIAGNOSIS — Z170 Estrogen receptor positive status [ER+]: Secondary | ICD-10-CM | POA: Diagnosis not present

## 2018-01-14 DIAGNOSIS — Z5112 Encounter for antineoplastic immunotherapy: Secondary | ICD-10-CM | POA: Insufficient documentation

## 2018-01-14 LAB — CBC WITH DIFFERENTIAL (CANCER CENTER ONLY)
BASOS PCT: 0 %
Basophils Absolute: 0 10*3/uL (ref 0.0–0.1)
EOS ABS: 0.1 10*3/uL (ref 0.0–0.5)
EOS PCT: 2 %
HCT: 32.1 % — ABNORMAL LOW (ref 34.8–46.6)
Hemoglobin: 10.1 g/dL — ABNORMAL LOW (ref 11.6–15.9)
Lymphocytes Relative: 28 %
Lymphs Abs: 1.2 10*3/uL (ref 0.9–3.3)
MCH: 27.7 pg (ref 26.0–34.0)
MCHC: 31.5 g/dL — ABNORMAL LOW (ref 32.0–36.0)
MCV: 87.9 fL (ref 81.0–101.0)
MONO ABS: 0.4 10*3/uL (ref 0.1–0.9)
MONOS PCT: 10 %
Neutro Abs: 2.6 10*3/uL (ref 1.5–6.5)
Neutrophils Relative %: 60 %
Platelet Count: 257 10*3/uL (ref 145–400)
RBC: 3.65 MIL/uL — ABNORMAL LOW (ref 3.70–5.32)
RDW: 13.8 % (ref 11.1–15.7)
WBC Count: 4.3 10*3/uL (ref 3.9–10.0)

## 2018-01-14 LAB — CMP (CANCER CENTER ONLY)
ALBUMIN: 4 g/dL (ref 3.5–5.0)
ALT: 14 U/L (ref 10–47)
AST: 23 U/L (ref 11–38)
Alkaline Phosphatase: 71 U/L (ref 26–84)
Anion gap: 10 (ref 5–15)
BUN: 19 mg/dL (ref 7–22)
CALCIUM: 9.5 mg/dL (ref 8.0–10.3)
CO2: 30 mmol/L (ref 18–33)
CREATININE: 1.3 mg/dL — AB (ref 0.60–1.20)
Chloride: 102 mmol/L (ref 98–108)
GLUCOSE: 89 mg/dL (ref 73–118)
Potassium: 3.7 mmol/L (ref 3.3–4.7)
Sodium: 142 mmol/L (ref 128–145)
TOTAL PROTEIN: 8 g/dL (ref 6.4–8.1)
Total Bilirubin: 0.9 mg/dL (ref 0.2–1.6)

## 2018-01-14 MED ORDER — SODIUM CHLORIDE 0.9 % IV SOLN
Freq: Once | INTRAVENOUS | Status: AC
  Administered 2018-01-14: 12:00:00 via INTRAVENOUS
  Filled 2018-01-14: qty 250

## 2018-01-14 MED ORDER — DIPHENHYDRAMINE HCL 50 MG/ML IJ SOLN
12.5000 mg | Freq: Once | INTRAMUSCULAR | Status: AC
  Administered 2018-01-14: 12.5 mg via INTRAVENOUS

## 2018-01-14 MED ORDER — DIPHENHYDRAMINE HCL 50 MG/ML IJ SOLN
INTRAMUSCULAR | Status: AC
  Filled 2018-01-14: qty 1

## 2018-01-14 MED ORDER — OXYCODONE HCL 5 MG PO TABS: 5.0000 mg | ORAL_TABLET | Freq: Four times a day (QID) | ORAL | 0 refills | Status: DC | PRN

## 2018-01-14 MED ORDER — SODIUM CHLORIDE 0.9 % IV SOLN
20.0000 mg | Freq: Once | INTRAVENOUS | Status: AC
  Administered 2018-01-14: 20 mg via INTRAVENOUS
  Filled 2018-01-14: qty 2

## 2018-01-14 MED ORDER — TRASTUZUMAB CHEMO 150 MG IV SOLR
6.0000 mg/kg | Freq: Once | INTRAVENOUS | Status: AC
  Administered 2018-01-14: 357 mg via INTRAVENOUS
  Filled 2018-01-14: qty 17

## 2018-01-14 MED ORDER — SODIUM CHLORIDE 0.9% FLUSH
10.0000 mL | INTRAVENOUS | Status: DC | PRN
  Administered 2018-01-14: 10 mL
  Filled 2018-01-14: qty 10

## 2018-01-14 MED ORDER — HEPARIN SOD (PORK) LOCK FLUSH 100 UNIT/ML IV SOLN
500.0000 [IU] | Freq: Once | INTRAVENOUS | Status: AC | PRN
  Administered 2018-01-14: 500 [IU]
  Filled 2018-01-14: qty 5

## 2018-01-14 NOTE — Progress Notes (Signed)
Hematology and Oncology Follow Up Visit  Michelle Bowen [id number] [birth year] 77 y.o. 01/14/2018   Principle Diagnosis:  Metastatic breast cancer - RIGHT - bone mets - TRIPLE POSITIVE  Current Therapy:   Herceptin 360 mg IV q 4 week - s/p cycle#11 Femara 2.5 mg po q day Xgeva 120 mg IM q 3 months - due again in June 2019   Interim History:  Michelle Bowen is here today for follow-up. She is doing well and states that her only complaint is the same intermittent left flank pain. The comes and goes and she takes her pain medication as needed which helps.  She verbalized that she is taking her Femara daily as prescribed.  ECHO earlier this month showed an EF of 60-65%.  CA 27.29 in June was 23.3.  She has had no issue with infections. No fever, chills, n/v, cough, rash, dizziness, SOB, chest pain, palpitations, abdominal pain or changes in bowel or bladder habits.   No swelling or tenderness in his extremities. The numbness and tingling in her hands and feet is unchanged.  She is using her cane when ambulating for support.  No lymphadenopathy noted on exam.  No episodes of bleeding, no bruising or petechiae.  She has maintained a good appetite and is staying well hydrated. Her weight is stable.   ECOG Performance Status: 1 - Symptomatic but completely ambulatory  Medications:  Allergies as of 01/14/2018   No Active Allergies     Medication List        Accurate as of 01/14/18 11:07 AM. Always use your most recent med list.          gabapentin 300 MG capsule Commonly known as:  NEURONTIN Take 1 capsule (300 mg total) by mouth at bedtime.   letrozole 2.5 MG tablet Commonly known as:  FEMARA Take 1 tablet (2.5 mg total) by mouth daily.   Misc. Devices Misc For Bilateral hip pain   oxyCODONE 5 MG immediate release tablet Commonly known as:  Oxy IR/ROXICODONE Take 1 tablet (5 mg total) by mouth every 6 (six) hours as needed for severe pain.   PROBIOTIC ACIDOPHILUS PO Take by  mouth.   triamterene-hydrochlorothiazide 37.5-25 MG capsule Commonly known as:  DYAZIDE Take 1 capsule by mouth daily.   vitamin B-12 500 MCG tablet Commonly known as:  CYANOCOBALAMIN Take 500 mcg by mouth daily.   Vitamin D3 5000 units Tabs Take by mouth.       Allergies: No Active Allergies  Past Medical History, Surgical history, Social history, and Family History were reviewed and updated.  Review of Systems: All other 10 point review of systems is negative.   Physical Exam:  weight is 147 lb (66.7 kg).   Wt Readings from Last 3 Encounters:  01/14/18 147 lb (66.7 kg)  01/14/18 147 lb 4 oz (66.8 kg)  12/17/17 144 lb (65.3 kg)    Ocular: Sclerae unicteric, pupils equal, round and reactive to light Ear-nose-throat: Oropharynx clear, dentition fair Lymphatic: No cervical, supraclavicular or axillary adenopathy Lungs no rales or rhonchi, good excursion bilaterally Heart regular rate and rhythm, no murmur appreciated Abd soft, nontender, positive bowel sounds, no liver or spleen tip palpated on exam, no fluid wave  MSK no focal spinal tenderness, no joint edema Neuro: non-focal, well-oriented, appropriate affect Breasts: No changes on today's exam, no lesion or rash, no new mass  Lab Results  Component Value Date   WBC 4.3 01/14/2018   HGB 10.1 (L) 01/14/2018   HCT  32.1 (L) 01/14/2018   MCV 87.9 01/14/2018   PLT 257 01/14/2018   Lab Results  Component Value Date   FERRITIN 12 12/17/2017   IRON 57 12/17/2017   TIBC 447 12/17/2017   UIBC 390 12/17/2017   IRONPCTSAT 13 12/17/2017   Lab Results  Component Value Date   RBC 3.65 (L) 01/14/2018   No results found for: KPAFRELGTCHN, LAMBDASER, KAPLAMBRATIO No results found for: IGGSERUM, IGA, IGMSERUM No results found for: Kathrynn Ducking, MSPIKE, SPEI   Chemistry      Component Value Date/Time   NA 142 01/14/2018 0950   NA 147 (H) 06/11/2017 1345   NA 139  04/23/2017 1256   K 3.7 01/14/2018 0950   K 4.3 06/11/2017 1345   K 4.2 04/23/2017 1256   CL 102 01/14/2018 0950   CL 106 06/11/2017 1345   CO2 30 01/14/2018 0950   CO2 27 06/11/2017 1345   CO2 28 04/23/2017 1256   BUN 19 01/14/2018 0950   BUN 13 06/11/2017 1345   BUN 10.0 04/23/2017 1256   CREATININE 1.30 (H) 01/14/2018 0950   CREATININE 1.1 06/11/2017 1345   CREATININE 0.8 04/23/2017 1256      Component Value Date/Time   CALCIUM 9.5 01/14/2018 0950   CALCIUM 8.9 06/11/2017 1345   CALCIUM 8.7 04/23/2017 1256   ALKPHOS 71 01/14/2018 0950   ALKPHOS 85 (H) 06/11/2017 1345   ALKPHOS 104 04/23/2017 1256   AST 23 01/14/2018 0950   AST 15 04/23/2017 1256   ALT 14 01/14/2018 0950   ALT 17 06/11/2017 1345   ALT <6 04/23/2017 1256   BILITOT 0.9 01/14/2018 0950   BILITOT 0.59 04/23/2017 1256      Impression and Plan: Michelle Bowen is a very pleasant 77 yo African American female with metastatic breast cancer, triple positive. She continues to tolerate treatment with Femara and Herceptin nicely.  She will continue her same regimen with Femara and we will proceed with Herceptin today as planned.  CA 27.29 result is pending.  Oxycodone prescription refilled today.  We will plan to repeat scans in September or October.  We will see her back in another 4 weeks for follow-up.  She will contact our office with any questions or concerns. We can certainly see her sooner if need be.  Michelle Peace, NP 7/25/201911:07 AM

## 2018-01-14 NOTE — Patient Instructions (Signed)
Diphenhydramine injection O que  este medicamento? A DIFENIDRAMINA  um anti-histamnico.  usada para tratar os sintomas de uma reao alrgica e o enjoo do movimento. Tambm  usada para tratar o mal de Parkinson. Este medicamento pode ser usado para outros propsitos; em caso de dvidas, pergunte ao seu profissional de sade ou farmacutico. NOMES DE MARCAS COMUNS: Benadryl O que devo dizer a meu profissional de sade antes de tomar este medicamento? Precisam saber se voc tem algum dos seguintes problemas ou estados de sade: -asma ou doenas pulmonares -glaucoma -presso alta ou doenas cardacas -doenas hepticas -dor ou dificuldade para urinar -problemas de prstata -lceras ou outros problemas estomacais -reao estranha ou alergia  difenidramina ou aos anti-histamnicos -reao estranha ou alergia a outros medicamentos, alimentos, corantes ou conservantes -est grvida ou tentando Hospital doctor -est amamentando Como devo usar este medicamento? Este medicamento deve ser injetado por via intravenosa ou intramuscular. Este medicamento costuma ser administrado por um profissional da sade no hospital ou em consultrio. Se este medicamento for [employer], voc ser ensinado a preparar e aplicar o medicamento. Use exatamente como indicado. Tome este medicamento em intervalos regulares. No tome este medicamento com frequncia maior do que a indicada.  muito importante que as Social worker e seringas usadas sejam descartadas em um coletor especial para materiais perfurocortantes. No as coloque na lata de lixo. Se no tiver um coletor para materiais perfurocortantes, pea um a seu farmacutico ou profissional de sade. Fale com seu pediatra a respeito do uso deste medicamento em crianas. Embora este medicamento possa ser receitado para certas condies, algumas precaues so necessrias. Este medicamento no foi aprovado para uso em recm-nascidos e bebs prematuros. Pacientes acima  de 60 anos podem ter uma reao mais forte ao medicamento e precisar tomar doses menores. Superdosagem: Se achar que tomou uma superdosagem deste medicamento, entre em contato imediatamente com o [facility] ou v a [employer]. OBSERVAO: Este medicamento  s para voc. No compartilhe este medicamento com outras pessoas. E se eu deixar de tomar uma dose? Se perder uma dose, tome-a assim que possvel. Se j estiver quase na hora da sua prxima dose, tome somente essa dose. No tome o remdio em dobro, nem tome uma dose adicional. O que pode interagir com este medicamento? No tome este medicamento com nenhum dos seguintes: -medicamentos chamados inibidores da MAO, como Nardil, Parnate, Marplan, Eldepryl, Carbex Este medicamento tambm pode interagir com os seguintes remdios: -lcool -barbitricos, como o fenobarbital -alguns medicamentos para espasmo da bexiga, como oxibutinina, tolterodina -medicamentos para hipertenso -medicamentos para depresso, ansiedade ou transtornos psicticos -medicamentos para distrbios do movimento ou mal de Parkinson -medicamentos para dormir -outros medicamentos para resfriado, tosse ou alergia -alguns medicamentos para o Paramedic, como clordiazepxido e diciclomina Esta lista pode no descrever todas as interaes possveis. D ao seu profissional de sade uma lista de todos os medicamentos, ervas medicinais, remdios de venda livre, ou suplementos alimentares que voc [place]. Diga tambm se voc fuma, bebe, ou [place] drogas ilcitas. Alguns destes podem interagir com o seu medicamento. Ao que devo ficar atento quando estiver [employer] medicamento? Voc ser monitorado(a) atentamente enquanto estiver [employer]. Avise seu mdico ou profissional de sade se os seus sintomas no melhorarem ou se piorarem. Voc pode sentir sonolncia ou tontura. No dirija, no opere mquinas e no faa nada que exija concentrao mental  at [employer] como o medicamento lhe afeta. No se sente nem se levante rpido demais, principalmente se for um paciente  idoso. Isso diminui o risco de tontura ou desmaio. O lcool pode interferir com os efeitos deste medicamento. Evite bebidas alcolicas. Voc pode ficar com a boca seca. Mascar chiclete sem acar ou chupar balas, alm de beber bastante gua, pode ajudar. Entre em contato com seu mdico se o problema Writer. Que efeitos colaterais posso sentir aps usar este medicamento? Efeitos colaterais que devem ser informados ao seu mdico ou profissional de sade o mais rpido possvel: -reaes alrgicas, como erupo na pele, coceira, urticria, ou inchao do rosto, dos lbios ou da lngua -dificuldade para respirar -alteraes na viso -calafrios -confuso, agitao, nervosismo -batimento cardaco acelerado ou irregular -presso baixa -convulses -tremores -dificuldade para urinar -sangramentos ou hematomas fora do comum -fraqueza ou cansao fora do comum Efeitos colaterais que normalmente no precisam de cuidados mdicos (avise ao seu mdico ou profissional de sade se persistirem ou forem incmodos): -priso de ventre ou diarreia -sonolncia -dor de cabea -perda de apetite -desarranjo estomacal, vmitos -suores -muco espesso Esta lista pode no descrever todos os efeitos colaterais possveis. Para mais orientaes sobre efeitos colaterais, consulte o seu mdico. Voc pode relatar a ocorrncia de efeitos colaterais  FDA pelo telefone [id number]. Onde devo guardar meu medicamento? [facility] fora do [employer]. Se estiver [employer] em casa, ser instrudo sobre como conserv-lo. Descartar qualquer medicamento no utilizado aps a data de validade impressa no rtulo ou embalagem. OBSERVAO: Este folheto  um resumo. Pode no cobrir todas as informaes possveis. Se tiver dvidas a respeito deste medicamento, fale com seu mdico,  farmacutico ou profissional de sade.  2018 Elsevier/Gold Standard (2010-03-13 00:00:00) Dexamethasone injection What is this medicine? DEXAMETHASONE (dex a METH a sone) is a corticosteroid. It is used to treat inflammation of the skin, joints, lungs, and other organs. Common conditions treated include asthma, allergies, and arthritis. It is also used for other conditions, like blood disorders and diseases of the adrenal glands. This medicine may be used for other purposes; ask your health care provider or pharmacist if you have questions. COMMON BRAND NAME(S): Decadron, DoubleDex, Simplist Dexamethasone, Solurex What should I tell my health care provider before I take this medicine? They need to know if you have any of these conditions: -blood clotting problems -Cushing's syndrome -diabetes -glaucoma -heart problems or disease -high blood pressure -infection like herpes, measles, tuberculosis, or chickenpox -kidney disease -liver disease -mental problems -myasthenia gravis -osteoporosis -previous heart attack -seizures -stomach, ulcer or intestine disease including colitis and diverticulitis -thyroid problem -an unusual or allergic reaction to dexamethasone, corticosteroids, other medicines, lactose, foods, dyes, or preservatives -pregnant or trying to get pregnant -breast-feeding How should I use this medicine? This medicine is for injection into a muscle, joint, lesion, soft tissue, or vein. It is given by a health care professional in a hospital or clinic setting. Talk to your pediatrician regarding the use of this medicine in children. Special care may be needed. Overdosage: If you think you have taken too much of this medicine contact a poison control center or emergency room at once. NOTE: This medicine is only for you. Do not share this medicine with others. What if I miss a dose? This may not apply. If you are having a series of injections over a prolonged period, try not  to miss an appointment. Call your doctor or health care professional to reschedule if you are unable to keep an appointment. What may interact with this medicine? Do not take this medicine with any of the following medications: -mifepristone,  RU-486 -vaccines This medicine may also interact with the following medications: -amphotericin B -antibiotics like clarithromycin, erythromycin, and troleandomycin -aspirin and aspirin-like drugs -barbiturates like phenobarbital -carbamazepine -cholestyramine -cholinesterase inhibitors like donepezil, galantamine, rivastigmine, and tacrine -cyclosporine -digoxin -diuretics -ephedrine -female hormones, like estrogens or progestins and birth control pills -indinavir -isoniazid -ketoconazole -medicines for diabetes -medicines that improve muscle tone or strength for conditions like myasthenia gravis -NSAIDs, medicines for pain and inflammation, like ibuprofen or naproxen -phenytoin -rifampin -thalidomide -warfarin This list may not describe all possible interactions. Give your health care provider a list of all the medicines, herbs, non-prescription drugs, or dietary supplements you use. Also tell them if you smoke, drink alcohol, or use illegal drugs. Some items may interact with your medicine. What should I watch for while using this medicine? Your condition will be monitored carefully while you are receiving this medicine. If you are taking this medicine for a long time, carry an identification card with your name and address, the type and dose of your medicine, and your doctor's name and address. This medicine may increase your risk of getting an infection. Stay away from people who are sick. Tell your doctor or health care professional if you are around anyone with measles or chickenpox. Talk to your health care provider before you get any vaccines that you take this medicine. If you are going to have surgery, tell your doctor or health care  professional that you have taken this medicine within the last twelve months. Ask your doctor or health care professional about your diet. You may need to lower the amount of salt you eat. The medicine can increase your blood sugar. If you are a diabetic check with your doctor if you need help adjusting the dose of your diabetic medicine. What side effects may I notice from receiving this medicine? Side effects that you should report to your doctor or health care professional as soon as possible: -allergic reactions like skin rash, itching or hives, swelling of the face, lips, or tongue -black or tarry stools -change in the amount of urine -changes in vision -confusion, excitement, restlessness, a false sense of well-being -fever, sore throat, sneezing, cough, or other signs of infection, wounds that will not heal -hallucinations -increased thirst -mental depression, mood swings, mistaken feelings of self importance or of being mistreated -pain in hips, back, ribs, arms, shoulders, or legs -pain, redness, or irritation at the injection site -redness, blistering, peeling or loosening of the skin, including inside the mouth -rounding out of face -swelling of feet or lower legs -unusual bleeding or bruising -unusual tired or weak -wounds that do not heal Side effects that usually do not require medical attention (report to your doctor or health care professional if they continue or are bothersome): -diarrhea or constipation -change in taste -headache -nausea, vomiting -skin problems, acne, thin and shiny skin -touble sleeping -unusual growth of hair on the face or body -weight gain This list may not describe all possible side effects. Call your doctor for medical advice about side effects. You may report side effects to FDA at 1-800-FDA-1088. Where should I keep my medicine? This drug is given in a hospital or clinic and will not be stored at home. NOTE: This sheet is a summary. It may  not cover all possible information. If you have questions about this medicine, talk to your doctor, pharmacist, or health care provider.  2018 Elsevier/Gold Standard (2007-09-30 14:04:12) Trastuzumab injection for infusion What is this medicine? TRASTUZUMAB (tras TOO zoo mab)  is a monoclonal antibody. It is used to treat breast cancer and stomach cancer. This medicine may be used for other purposes; ask your health care provider or pharmacist if you have questions. COMMON BRAND NAME(S): Herceptin What should I tell my health care provider before I take this medicine? They need to know if you have any of these conditions: -heart disease -heart failure -lung or breathing disease, like asthma -an unusual or allergic reaction to trastuzumab, benzyl alcohol, or other medications, foods, dyes, or preservatives -pregnant or trying to get pregnant -breast-feeding How should I use this medicine? This drug is given as an infusion into a vein. It is administered in a hospital or clinic by a specially trained health care professional. Talk to your pediatrician regarding the use of this medicine in children. This medicine is not approved for use in children. Overdosage: If you think you have taken too much of this medicine contact a poison control center or emergency room at once. NOTE: This medicine is only for you. Do not share this medicine with others. What if I miss a dose? It is important not to miss a dose. Call your doctor or health care professional if you are unable to keep an appointment. What may interact with this medicine? This medicine may interact with the following medications: -certain types of chemotherapy, such as daunorubicin, doxorubicin, epirubicin, and idarubicin This list may not describe all possible interactions. Give your health care provider a list of all the medicines, herbs, non-prescription drugs, or dietary supplements you use. Also tell them if you smoke, drink alcohol,  or use illegal drugs. Some items may interact with your medicine. What should I watch for while using this medicine? Visit your doctor for checks on your progress. Report any side effects. Continue your course of treatment even though you feel ill unless your doctor tells you to stop. Call your doctor or health care professional for advice if you get a fever, chills or sore throat, or other symptoms of a cold or flu. Do not treat yourself. Try to avoid being around people who are sick. You may experience fever, chills and shaking during your first infusion. These effects are usually mild and can be treated with other medicines. Report any side effects during the infusion to your health care professional. Fever and chills usually do not happen with later infusions. Do not become pregnant while taking this medicine or for 7 months after stopping it. Women should inform their doctor if they wish to become pregnant or think they might be pregnant. Women of child-bearing potential will need to have a negative pregnancy test before starting this medicine. There is a potential for serious side effects to an unborn child. Talk to your health care professional or pharmacist for more information. Do not breast-feed an infant while taking this medicine or for 7 months after stopping it. Women must use effective birth control with this medicine. What side effects may I notice from receiving this medicine? Side effects that you should report to your doctor or health care professional as soon as possible: -allergic reactions like skin rash, itching or hives, swelling of the face, lips, or tongue -chest pain or palpitations -cough -dizziness -feeling faint or lightheaded, falls -fever -general ill feeling or flu-like symptoms -signs of worsening heart failure like breathing problems; swelling in your legs and feet -unusually weak or tired Side effects that usually do not require medical attention (report to your  doctor or health care professional if they continue  or are bothersome): -bone pain -changes in taste -diarrhea -joint pain -nausea/vomiting -weight loss This list may not describe all possible side effects. Call your doctor for medical advice about side effects. You may report side effects to FDA at 1-800-FDA-1088. Where should I keep my medicine? This drug is given in a hospital or clinic and will not be stored at home. NOTE: This sheet is a summary. It may not cover all possible information. If you have questions about this medicine, talk to your doctor, pharmacist, or health care provider.  2018 Elsevier/Gold Standard (2016-06-03 14:37:52)

## 2018-01-15 LAB — CANCER ANTIGEN 27.29: CA 27.29: 24.3 U/mL (ref 0.0–38.6)

## 2018-02-11 ENCOUNTER — Encounter: Payer: Self-pay | Admitting: Hematology & Oncology

## 2018-02-11 ENCOUNTER — Inpatient Hospital Stay: Payer: Medicare Other

## 2018-02-11 ENCOUNTER — Inpatient Hospital Stay: Payer: Medicare Other | Attending: Hematology & Oncology | Admitting: Hematology & Oncology

## 2018-02-11 ENCOUNTER — Other Ambulatory Visit: Payer: Self-pay

## 2018-02-11 VITALS — BP 202/72 | HR 67 | Temp 98.3°F | Resp 18 | Wt 148.0 lb

## 2018-02-11 DIAGNOSIS — Z170 Estrogen receptor positive status [ER+]: Principal | ICD-10-CM

## 2018-02-11 DIAGNOSIS — C50411 Malignant neoplasm of upper-outer quadrant of right female breast: Secondary | ICD-10-CM

## 2018-02-11 DIAGNOSIS — C7951 Secondary malignant neoplasm of bone: Secondary | ICD-10-CM | POA: Diagnosis not present

## 2018-02-11 DIAGNOSIS — C50911 Malignant neoplasm of unspecified site of right female breast: Secondary | ICD-10-CM | POA: Insufficient documentation

## 2018-02-11 DIAGNOSIS — Z5112 Encounter for antineoplastic immunotherapy: Secondary | ICD-10-CM | POA: Diagnosis present

## 2018-02-11 LAB — CBC WITH DIFFERENTIAL (CANCER CENTER ONLY)
BASOS ABS: 0 10*3/uL (ref 0.0–0.1)
Basophils Relative: 0 %
EOS PCT: 1 %
Eosinophils Absolute: 0.1 10*3/uL (ref 0.0–0.5)
HEMATOCRIT: 32.3 % — AB (ref 34.8–46.6)
Hemoglobin: 10 g/dL — ABNORMAL LOW (ref 11.6–15.9)
LYMPHS ABS: 1.1 10*3/uL (ref 0.9–3.3)
LYMPHS PCT: 25 %
MCH: 27 pg (ref 26.0–34.0)
MCHC: 31 g/dL — AB (ref 32.0–36.0)
MCV: 87.3 fL (ref 81.0–101.0)
MONO ABS: 0.5 10*3/uL (ref 0.1–0.9)
MONOS PCT: 11 %
NEUTROS ABS: 2.7 10*3/uL (ref 1.5–6.5)
Neutrophils Relative %: 63 %
Platelet Count: 249 10*3/uL (ref 145–400)
RBC: 3.7 MIL/uL (ref 3.70–5.32)
RDW: 14.2 % (ref 11.1–15.7)
WBC Count: 4.3 10*3/uL (ref 3.9–10.0)

## 2018-02-11 LAB — CMP (CANCER CENTER ONLY)
ALT: 11 U/L (ref 10–47)
ANION GAP: 3 — AB (ref 5–15)
AST: 20 U/L (ref 11–38)
Albumin: 4 g/dL (ref 3.5–5.0)
Alkaline Phosphatase: 64 U/L (ref 26–84)
BUN: 20 mg/dL (ref 7–22)
CHLORIDE: 102 mmol/L (ref 98–108)
CO2: 30 mmol/L (ref 18–33)
Calcium: 9.6 mg/dL (ref 8.0–10.3)
Creatinine: 1.3 mg/dL — ABNORMAL HIGH (ref 0.60–1.20)
GLUCOSE: 91 mg/dL (ref 73–118)
POTASSIUM: 3.8 mmol/L (ref 3.3–4.7)
Sodium: 135 mmol/L (ref 128–145)
Total Bilirubin: 0.9 mg/dL (ref 0.2–1.6)
Total Protein: 8.1 g/dL (ref 6.4–8.1)

## 2018-02-11 MED ORDER — HEPARIN SOD (PORK) LOCK FLUSH 100 UNIT/ML IV SOLN
500.0000 [IU] | Freq: Once | INTRAVENOUS | Status: AC | PRN
  Administered 2018-02-11: 500 [IU]
  Filled 2018-02-11: qty 5

## 2018-02-11 MED ORDER — SODIUM CHLORIDE 0.9 % IV SOLN
Freq: Once | INTRAVENOUS | Status: AC
  Administered 2018-02-11: 11:00:00 via INTRAVENOUS
  Filled 2018-02-11: qty 250

## 2018-02-11 MED ORDER — DIPHENHYDRAMINE HCL 50 MG/ML IJ SOLN
INTRAMUSCULAR | Status: AC
  Filled 2018-02-11: qty 1

## 2018-02-11 MED ORDER — DIPHENHYDRAMINE HCL 50 MG/ML IJ SOLN
12.5000 mg | Freq: Once | INTRAMUSCULAR | Status: AC
  Administered 2018-02-11: 12.5 mg via INTRAVENOUS

## 2018-02-11 MED ORDER — SODIUM CHLORIDE 0.9 % IV SOLN
20.0000 mg | Freq: Once | INTRAVENOUS | Status: AC
  Administered 2018-02-11: 20 mg via INTRAVENOUS
  Filled 2018-02-11: qty 2

## 2018-02-11 MED ORDER — SODIUM CHLORIDE 0.9% FLUSH
10.0000 mL | INTRAVENOUS | Status: DC | PRN
  Administered 2018-02-11: 10 mL
  Filled 2018-02-11: qty 10

## 2018-02-11 MED ORDER — TRASTUZUMAB CHEMO 150 MG IV SOLR
6.0000 mg/kg | Freq: Once | INTRAVENOUS | Status: AC
  Administered 2018-02-11: 357 mg via INTRAVENOUS
  Filled 2018-02-11: qty 17

## 2018-02-11 NOTE — Patient Instructions (Signed)
Trastuzumab injection for infusion What is this medicine? TRASTUZUMAB (tras TOO zoo mab) is a monoclonal antibody. It is used to treat breast cancer and stomach cancer. This medicine may be used for other purposes; ask your health care provider or pharmacist if you have questions. COMMON BRAND NAME(S): Herceptin What should I tell my health care provider before I take this medicine? They need to know if you have any of these conditions: -heart disease -heart failure -lung or breathing disease, like asthma -an unusual or allergic reaction to trastuzumab, benzyl alcohol, or other medications, foods, dyes, or preservatives -pregnant or trying to get pregnant -breast-feeding How should I use this medicine? This drug is given as an infusion into a vein. It is administered in a hospital or clinic by a specially trained health care professional. Talk to your pediatrician regarding the use of this medicine in children. This medicine is not approved for use in children. Overdosage: If you think you have taken too much of this medicine contact a poison control center or emergency room at once. NOTE: This medicine is only for you. Do not share this medicine with others. What if I miss a dose? It is important not to miss a dose. Call your doctor or health care professional if you are unable to keep an appointment. What may interact with this medicine? This medicine may interact with the following medications: -certain types of chemotherapy, such as daunorubicin, doxorubicin, epirubicin, and idarubicin This list may not describe all possible interactions. Give your health care provider a list of all the medicines, herbs, non-prescription drugs, or dietary supplements you use. Also tell them if you smoke, drink alcohol, or use illegal drugs. Some items may interact with your medicine. What should I watch for while using this medicine? Visit your doctor for checks on your progress. Report any side effects.  Continue your course of treatment even though you feel ill unless your doctor tells you to stop. Call your doctor or health care professional for advice if you get a fever, chills or sore throat, or other symptoms of a cold or flu. Do not treat yourself. Try to avoid being around people who are sick. You may experience fever, chills and shaking during your first infusion. These effects are usually mild and can be treated with other medicines. Report any side effects during the infusion to your health care professional. Fever and chills usually do not happen with later infusions. Do not become pregnant while taking this medicine or for 7 months after stopping it. Women should inform their doctor if they wish to become pregnant or think they might be pregnant. Women of child-bearing potential will need to have a negative pregnancy test before starting this medicine. There is a potential for serious side effects to an unborn child. Talk to your health care professional or pharmacist for more information. Do not breast-feed an infant while taking this medicine or for 7 months after stopping it. Women must use effective birth control with this medicine. What side effects may I notice from receiving this medicine? Side effects that you should report to your doctor or health care professional as soon as possible: -allergic reactions like skin rash, itching or hives, swelling of the face, lips, or tongue -chest pain or palpitations -cough -dizziness -feeling faint or lightheaded, falls -fever -general ill feeling or flu-like symptoms -signs of worsening heart failure like breathing problems; swelling in your legs and feet -unusually weak or tired Side effects that usually do not require medical   attention (report to your doctor or health care professional if they continue or are bothersome): -bone pain -changes in taste -diarrhea -joint pain -nausea/vomiting -weight loss This list may not describe all  possible side effects. Call your doctor for medical advice about side effects. You may report side effects to FDA at 1-800-FDA-1088. Where should I keep my medicine? This drug is given in a hospital or clinic and will not be stored at home. NOTE: This sheet is a summary. It may not cover all possible information. If you have questions about this medicine, talk to your doctor, pharmacist, or health care provider.  2018 Elsevier/Gold Standard (2016-06-03 14:37:52)  

## 2018-02-11 NOTE — Progress Notes (Signed)
Hematology and Oncology Follow Up Visit  Michelle Bowen [id number] [birth year] 77 y.o. 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  She continues to do quite well.  She really has no complaints.  She gets around okay.  She does have a cane.  She has had no nausea or vomiting.  She has had no cough.  She has had no change in bowel or bladder habits.  Her last CA 27.29 back in July was 24.3 which is holding steady.  She did have an echocardiogram done in July.  This showed a left ventricular EF of 60-65%.    Overall, her performance status is ECOG 1.  Medications:  Allergies as of 02/11/2018   No Active Allergies     Medication List        Accurate as of 02/11/18 10:50 AM. Always use your most recent med list.          gabapentin 300 MG capsule Commonly known as:  NEURONTIN Take 1 capsule (300 mg total) by mouth at bedtime.   letrozole 2.5 MG tablet Commonly known as:  FEMARA Take 1 tablet (2.5 mg total) by mouth daily.   Misc. Devices Misc For Bilateral hip pain   oxyCODONE 5 MG immediate release tablet Commonly known as:  Oxy IR/ROXICODONE Take 1 tablet (5 mg total) by mouth every 6 (six) hours as needed for severe pain.   PROBIOTIC ACIDOPHILUS PO Take by mouth.   triamterene-hydrochlorothiazide 37.5-25 MG capsule Commonly known as:  DYAZIDE Take 1 capsule by mouth daily.   vitamin B-12 500 MCG tablet Commonly known as:  CYANOCOBALAMIN Take 500 mcg by mouth daily.   Vitamin D3 5000 units Tabs Take by mouth.       Allergies: No Active Allergies  Past Medical History, Surgical history, Social history, and Family History were reviewed and updated.  Review of Systems: Review of Systems    Constitutional: Negative.   HENT: Negative.   Eyes: Negative.   Respiratory: Negative.   Cardiovascular: Negative.   Gastrointestinal: Negative.   Genitourinary: Negative.   Musculoskeletal: Negative.   Skin: Negative.   Neurological: Negative.   Endo/Heme/Allergies: Negative.   Psychiatric/Behavioral: Negative.       Physical Exam:  weight is 148 lb (67.1 kg). Her oral temperature is 98.3 F (36.8 C). Her blood pressure is 202/72 (abnormal) and her pulse is 67. Her respiration is 18 and oxygen saturation is 100%.   Wt Readings from Last 3 Encounters:  02/11/18 148 lb (67.1 kg)  02/11/18 148 lb 4 oz (67.2 kg)  01/14/18 147 lb (66.7 kg)    Physical Exam  Constitutional: She is oriented to person, place, and time.  HENT:  Head: Normocephalic and atraumatic.  Mouth/Throat: Oropharynx is clear and moist.  Eyes: Pupils are equal, round, and reactive to light. EOM are normal.  Neck: Normal range of motion.  Cardiovascular: Normal rate, regular rhythm and normal heart sounds.  Pulmonary/Chest: Effort normal and breath sounds normal.  Abdominal: Soft. Bowel sounds are normal.  Musculoskeletal: Normal range of motion. She exhibits no edema, tenderness or deformity.  Lymphadenopathy:    She has no cervical adenopathy.  Neurological: She is alert  and oriented to person, place, and time.  Skin: Skin is warm and dry. No rash noted. No erythema.  Psychiatric: She has a normal mood and affect. Her behavior is normal. Judgment and thought content normal.  Vitals reviewed.    Lab Results  Component Value Date   WBC 4.3 02/11/2018   HGB 10.0 (L) 02/11/2018   HCT 32.3 (L) 02/11/2018   MCV 87.3 02/11/2018   PLT 249 02/11/2018   Lab Results  Component Value Date   FERRITIN 12 12/17/2017   IRON 57 12/17/2017   TIBC 447 12/17/2017   UIBC 390 12/17/2017   IRONPCTSAT 13 12/17/2017   Lab Results  Component Value Date   RBC 3.70 02/11/2018   No results found for: KPAFRELGTCHN,  LAMBDASER, KAPLAMBRATIO No results found for: IGGSERUM, IGA, IGMSERUM No results found for: Kathrynn Ducking, MSPIKE, SPEI   Chemistry      Component Value Date/Time   NA 135 02/11/2018 0941   NA 147 (H) 06/11/2017 1345   NA 139 04/23/2017 1256   K 3.8 02/11/2018 0941   K 4.3 06/11/2017 1345   K 4.2 04/23/2017 1256   CL 102 02/11/2018 0941   CL 106 06/11/2017 1345   CO2 30 02/11/2018 0941   CO2 27 06/11/2017 1345   CO2 28 04/23/2017 1256   BUN 20 02/11/2018 0941   BUN 13 06/11/2017 1345   BUN 10.0 04/23/2017 1256   CREATININE 1.30 (H) 02/11/2018 0941   CREATININE 1.1 06/11/2017 1345   CREATININE 0.8 04/23/2017 1256      Component Value Date/Time   CALCIUM 9.6 02/11/2018 0941   CALCIUM 8.9 06/11/2017 1345   CALCIUM 8.7 04/23/2017 1256   ALKPHOS 64 02/11/2018 0941   ALKPHOS 85 (H) 06/11/2017 1345   ALKPHOS 104 04/23/2017 1256   AST 20 02/11/2018 0941   AST 15 04/23/2017 1256   ALT 11 02/11/2018 0941   ALT 17 06/11/2017 1345   ALT <6 04/23/2017 1256   BILITOT 0.9 02/11/2018 0941   BILITOT 0.59 04/23/2017 1256      Impression and Plan: Michelle Bowen is a very pleasant 77 yo African American female with metastatic breast cancer, triple positive. She continues to do well and will proceed with Herceptin today as planned.   She is doing well with the every 4-week Herceptin.  Her last scans were done in June.  We probably will set her up with some scans in October.  We will plan to get her back in another 4 weeks.  Volanda Napoleon, MD 8/22/201910:50 AM

## 2018-02-12 LAB — CANCER ANTIGEN 27.29: CA 27.29: 24 U/mL (ref 0.0–38.6)

## 2018-03-11 ENCOUNTER — Other Ambulatory Visit: Payer: Self-pay

## 2018-03-11 ENCOUNTER — Inpatient Hospital Stay: Payer: Medicare Other

## 2018-03-11 ENCOUNTER — Inpatient Hospital Stay: Payer: Medicare Other | Attending: Hematology & Oncology | Admitting: Family

## 2018-03-11 ENCOUNTER — Encounter: Payer: Self-pay | Admitting: Family

## 2018-03-11 VITALS — BP 172/75 | HR 72 | Temp 98.2°F | Resp 17 | Wt 149.5 lb

## 2018-03-11 DIAGNOSIS — G629 Polyneuropathy, unspecified: Secondary | ICD-10-CM | POA: Diagnosis not present

## 2018-03-11 DIAGNOSIS — C7951 Secondary malignant neoplasm of bone: Secondary | ICD-10-CM

## 2018-03-11 DIAGNOSIS — Z5112 Encounter for antineoplastic immunotherapy: Secondary | ICD-10-CM | POA: Insufficient documentation

## 2018-03-11 DIAGNOSIS — Z170 Estrogen receptor positive status [ER+]: Secondary | ICD-10-CM | POA: Insufficient documentation

## 2018-03-11 DIAGNOSIS — C50411 Malignant neoplasm of upper-outer quadrant of right female breast: Secondary | ICD-10-CM

## 2018-03-11 DIAGNOSIS — C50911 Malignant neoplasm of unspecified site of right female breast: Secondary | ICD-10-CM | POA: Insufficient documentation

## 2018-03-11 LAB — CMP (CANCER CENTER ONLY)
ALT: 11 U/L (ref 10–47)
AST: 24 U/L (ref 11–38)
Albumin: 4.2 g/dL (ref 3.5–5.0)
Alkaline Phosphatase: 71 U/L (ref 26–84)
Anion gap: 10 (ref 5–15)
BUN: 19 mg/dL (ref 7–22)
CO2: 29 mmol/L (ref 18–33)
CREATININE: 1.5 mg/dL — AB (ref 0.60–1.20)
Calcium: 9.6 mg/dL (ref 8.0–10.3)
Chloride: 104 mmol/L (ref 98–108)
Glucose, Bld: 90 mg/dL (ref 73–118)
POTASSIUM: 3.8 mmol/L (ref 3.3–4.7)
SODIUM: 143 mmol/L (ref 128–145)
TOTAL PROTEIN: 8.5 g/dL — AB (ref 6.4–8.1)
Total Bilirubin: 1 mg/dL (ref 0.2–1.6)

## 2018-03-11 LAB — CBC WITH DIFFERENTIAL (CANCER CENTER ONLY)
Basophils Absolute: 0 10*3/uL (ref 0.0–0.1)
Basophils Relative: 0 %
EOS ABS: 0 10*3/uL (ref 0.0–0.5)
Eosinophils Relative: 1 %
HCT: 32.5 % — ABNORMAL LOW (ref 34.8–46.6)
HEMOGLOBIN: 10.1 g/dL — AB (ref 11.6–15.9)
LYMPHS ABS: 1 10*3/uL (ref 0.9–3.3)
LYMPHS PCT: 24 %
MCH: 26.9 pg (ref 26.0–34.0)
MCHC: 31.1 g/dL — ABNORMAL LOW (ref 32.0–36.0)
MCV: 86.4 fL (ref 81.0–101.0)
Monocytes Absolute: 0.4 10*3/uL (ref 0.1–0.9)
Monocytes Relative: 11 %
NEUTROS ABS: 2.6 10*3/uL (ref 1.5–6.5)
Neutrophils Relative %: 64 %
Platelet Count: 283 10*3/uL (ref 145–400)
RBC: 3.76 MIL/uL (ref 3.70–5.32)
RDW: 14.4 % (ref 11.1–15.7)
WBC: 4 10*3/uL (ref 3.9–10.0)

## 2018-03-11 MED ORDER — DENOSUMAB 120 MG/1.7ML ~~LOC~~ SOLN
120.0000 mg | Freq: Once | SUBCUTANEOUS | Status: AC
  Administered 2018-03-11: 120 mg via SUBCUTANEOUS

## 2018-03-11 MED ORDER — SODIUM CHLORIDE 0.9 % IV SOLN
Freq: Once | INTRAVENOUS | Status: AC
  Administered 2018-03-11: 11:00:00 via INTRAVENOUS
  Filled 2018-03-11: qty 250

## 2018-03-11 MED ORDER — GABAPENTIN 300 MG PO CAPS: 300.0000 mg | ORAL_CAPSULE | Freq: Three times a day (TID) | ORAL | 2 refills | Status: DC

## 2018-03-11 MED ORDER — SODIUM CHLORIDE 0.9 % IV SOLN
20.0000 mg | Freq: Once | INTRAVENOUS | Status: AC
  Administered 2018-03-11: 20 mg via INTRAVENOUS
  Filled 2018-03-11: qty 2

## 2018-03-11 MED ORDER — SODIUM CHLORIDE 0.9% FLUSH
10.0000 mL | INTRAVENOUS | Status: DC | PRN
  Administered 2018-03-11: 10 mL
  Filled 2018-03-11: qty 10

## 2018-03-11 MED ORDER — DIPHENHYDRAMINE HCL 50 MG/ML IJ SOLN
12.5000 mg | Freq: Once | INTRAMUSCULAR | Status: AC
  Administered 2018-03-11: 12.5 mg via INTRAVENOUS

## 2018-03-11 MED ORDER — TRASTUZUMAB CHEMO 150 MG IV SOLR
400.0000 mg | Freq: Once | INTRAVENOUS | Status: AC
  Administered 2018-03-11: 400 mg via INTRAVENOUS
  Filled 2018-03-11: qty 19.05

## 2018-03-11 MED ORDER — DIPHENHYDRAMINE HCL 50 MG/ML IJ SOLN
INTRAMUSCULAR | Status: AC
  Filled 2018-03-11: qty 1

## 2018-03-11 MED ORDER — DENOSUMAB 120 MG/1.7ML ~~LOC~~ SOLN
SUBCUTANEOUS | Status: AC
  Filled 2018-03-11: qty 1.7

## 2018-03-11 MED ORDER — HEPARIN SOD (PORK) LOCK FLUSH 100 UNIT/ML IV SOLN
500.0000 [IU] | Freq: Once | INTRAVENOUS | Status: AC | PRN
  Administered 2018-03-11: 500 [IU]
  Filled 2018-03-11: qty 5

## 2018-03-11 NOTE — Progress Notes (Signed)
Michelle Bowen weight has increased.  Discussed with Dr. Marin Olp.  He would like to increase the dose of Herceptin to reflect her current weight.

## 2018-03-11 NOTE — Progress Notes (Addendum)
Hematology and Oncology Follow Up Visit  ROY SNUFFER [id number] 03-26-1941 77 y.o. 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 is here today with her friend for follow-up. She is doing well and getting aruond nicely using her cane for support.  CA 27.29 was 24.0 in August.  She verbalized that she is taking her Femara daily as prescribed.  No fever, chills, n/v, cough, rash, dizziness, SOB, chest pain, palpitations, abdominal pain or changes in bowel or bladder habits.  No episode sof bleeding, no bruising or petechiae.  No hot flashes or night sweats.  No swelling or tenderness in her extremities. 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 She states that the numbness and tingling in her feet is starting to effect her gait. She has not had any falls or syncopal episodes. She takes Neurontin 300 mg PO at bedtimes.  She has maintained a good appetite and is staying well hydrated. Her weight is stable.   ECOG Performance Status: 1 - Symptomatic but completely ambulatory  Medications:  Allergies as of 03/11/2018   No Active Allergies     Medication List        Accurate as of 03/11/18 12:23 PM. Always use your most recent med list.          gabapentin 300 MG capsule Commonly known as:  NEURONTIN Take 1 capsule (300 mg total) by mouth 3 (three) times daily.   letrozole 2.5 MG tablet Commonly known as:  FEMARA Take 1 tablet (2.5 mg total) by mouth daily.   Misc. Devices Misc For Bilateral hip pain   oxyCODONE 5 MG immediate release tablet Commonly known as:  Oxy IR/ROXICODONE Take 1 tablet (5 mg total) by mouth every 6 (six) hours as needed for severe pain.   PROBIOTIC ACIDOPHILUS PO Take by mouth.     triamterene-hydrochlorothiazide 37.5-25 MG capsule Commonly known as:  DYAZIDE Take 1 capsule by mouth daily.   vitamin B-12 500 MCG tablet Commonly known as:  CYANOCOBALAMIN Take 500 mcg by mouth daily.   Vitamin D3 5000 units Tabs Take by mouth.       Allergies: No Active Allergies  Past Medical History, Surgical history, Social history, and Family History were reviewed and updated.  Review of Systems: All other 10 point review of systems is negative.   Physical Exam:  weight is 149 lb 8 oz (67.8 kg). Her oral temperature is 98.2 F (36.8 C). Her blood pressure is 172/75 (abnormal) and her pulse is 72. Her respiration is 17 and oxygen saturation is 100%.   Wt Readings from Last 3 Encounters:  03/11/18 149 lb 8 oz (67.8 kg)  03/11/18 149 lb 8 oz (67.8 kg)  02/11/18 148 lb (67.1 kg)    Ocular: Sclerae unicteric, pupils equal, round and reactive to light Ear-nose-throat: Oropharynx clear, dentition fair Lymphatic: No cervical, supraclavicular or axillary adenopathy Lungs no rales or rhonchi, good excursion bilaterally Heart regular rate and rhythm, no murmur appreciated Abd soft, nontender, positive bowel sounds, no liver or spleen tip palpated on exam, no fluid wave  MSK no focal spinal tenderness, no joint edema Neuro: non-focal, well-oriented, appropriate affect Breasts: No changes, no lesion or rash  Lab Results  Component Value Date   WBC 4.0 03/11/2018   HGB 10.1 (L) 03/11/2018   HCT 32.5 (L) 03/11/2018   MCV 86.4 03/11/2018   PLT 283 03/11/2018   Lab Results  Component Value Date   FERRITIN 12 12/17/2017   IRON 57 12/17/2017   TIBC 447 12/17/2017   UIBC 390 12/17/2017   IRONPCTSAT 13 12/17/2017   Lab Results  Component Value Date   RBC 3.76 03/11/2018   No results found for: KPAFRELGTCHN, LAMBDASER, KAPLAMBRATIO No results found for: IGGSERUM, IGA, IGMSERUM No results found for: Odetta Pink,  SPEI   Chemistry      Component Value Date/Time   NA 143 03/11/2018 1006   NA 147 (H) 06/11/2017 1345   NA 139 04/23/2017 1256   K 3.8 03/11/2018 1006   K 4.3 06/11/2017 1345   K 4.2 04/23/2017 1256   CL 104 03/11/2018 1006   CL 106 06/11/2017 1345   CO2 29 03/11/2018 1006   CO2 27 06/11/2017 1345   CO2 28 04/23/2017 1256   BUN 19 03/11/2018 1006   BUN 13 06/11/2017 1345   BUN 10.0 04/23/2017 1256   CREATININE 1.50 (H) 03/11/2018 1006   CREATININE 1.1 06/11/2017 1345   CREATININE 0.8 04/23/2017 1256      Component Value Date/Time   CALCIUM 9.6 03/11/2018 1006   CALCIUM 8.9 06/11/2017 1345   CALCIUM 8.7 04/23/2017 1256   ALKPHOS 71 03/11/2018 1006   ALKPHOS 85 (H) 06/11/2017 1345   ALKPHOS 104 04/23/2017 1256   AST 24 03/11/2018 1006   AST 15 04/23/2017 1256   ALT 11 03/11/2018 1006   ALT 17 06/11/2017 1345   ALT <6 04/23/2017 1256   BILITOT 1.0 03/11/2018 1006   BILITOT 0.59 04/23/2017 1256      Impression and Plan: Ms. Casella is a very pleasant 77 yo African American female with metastatic breast cancer, triple positive. CA 27.29 has remained stable.  She feels that the neuropathy in her feet is starting to effect her gait. We will have her try increasing her Neurontin to TID and see if this helps. We discussed side effects and she verbalized understanding.  We will proceed with treatment today as planned.  I spoke with Dr. Marin Olp and we will hold off on scans for now.  We will see her back in another 4 weeks.  She will contact our office with any questions or concerns. We can certainly see her sooner if need be.   Laverna Peace, NP 9/19/201912:23 PM

## 2018-03-11 NOTE — Patient Instructions (Signed)
Trastuzumab injection for infusion What is this medicine? TRASTUZUMAB (tras TOO zoo mab) is a monoclonal antibody. It is used to treat breast cancer and stomach cancer. This medicine may be used for other purposes; ask your health care provider or pharmacist if you have questions. COMMON BRAND NAME(S): Herceptin What should I tell my health care provider before I take this medicine? They need to know if you have any of these conditions: -heart disease -heart failure -lung or breathing disease, like asthma -an unusual or allergic reaction to trastuzumab, benzyl alcohol, or other medications, foods, dyes, or preservatives -pregnant or trying to get pregnant -breast-feeding How should I use this medicine? This drug is given as an infusion into a vein. It is administered in a hospital or clinic by a specially trained health care professional. Talk to your pediatrician regarding the use of this medicine in children. This medicine is not approved for use in children. Overdosage: If you think you have taken too much of this medicine contact a poison control center or emergency room at once. NOTE: This medicine is only for you. Do not share this medicine with others. What if I miss a dose? It is important not to miss a dose. Call your doctor or health care professional if you are unable to keep an appointment. What may interact with this medicine? This medicine may interact with the following medications: -certain types of chemotherapy, such as daunorubicin, doxorubicin, epirubicin, and idarubicin This list may not describe all possible interactions. Give your health care provider a list of all the medicines, herbs, non-prescription drugs, or dietary supplements you use. Also tell them if you smoke, drink alcohol, or use illegal drugs. Some items may interact with your medicine. What should I watch for while using this medicine? Visit your doctor for checks on your progress. Report any side effects.  Continue your course of treatment even though you feel ill unless your doctor tells you to stop. Call your doctor or health care professional for advice if you get a fever, chills or sore throat, or other symptoms of a cold or flu. Do not treat yourself. Try to avoid being around people who are sick. You may experience fever, chills and shaking during your first infusion. These effects are usually mild and can be treated with other medicines. Report any side effects during the infusion to your health care professional. Fever and chills usually do not happen with later infusions. Do not become pregnant while taking this medicine or for 7 months after stopping it. Women should inform their doctor if they wish to become pregnant or think they might be pregnant. Women of child-bearing potential will need to have a negative pregnancy test before starting this medicine. There is a potential for serious side effects to an unborn child. Talk to your health care professional or pharmacist for more information. Do not breast-feed an infant while taking this medicine or for 7 months after stopping it. Women must use effective birth control with this medicine. What side effects may I notice from receiving this medicine? Side effects that you should report to your doctor or health care professional as soon as possible: -allergic reactions like skin rash, itching or hives, swelling of the face, lips, or tongue -chest pain or palpitations -cough -dizziness -feeling faint or lightheaded, falls -fever -general ill feeling or flu-like symptoms -signs of worsening heart failure like breathing problems; swelling in your legs and feet -unusually weak or tired Side effects that usually do not require medical  attention (report to your doctor or health care professional if they continue or are bothersome): -bone pain -changes in taste -diarrhea -joint pain -nausea/vomiting -weight loss This list may not describe all  possible side effects. Call your doctor for medical advice about side effects. You may report side effects to FDA at 1-800-FDA-1088. Where should I keep my medicine? This drug is given in a hospital or clinic and will not be stored at home. NOTE: This sheet is a summary. It may not cover all possible information. If you have questions about this medicine, talk to your doctor, pharmacist, or health care provider.  2018 Elsevier/Gold Standard (2016-06-03 14:37:52) Denosumab injection What is this medicine? DENOSUMAB (den oh sue mab) slows bone breakdown. Prolia is used to treat osteoporosis in women after menopause and in men. Xgeva is used to treat a high calcium level due to cancer and to prevent bone fractures and other bone problems caused by multiple myeloma or cancer bone metastases. Xgeva is also used to treat giant cell tumor of the bone. This medicine may be used for other purposes; ask your health care provider or pharmacist if you have questions. COMMON BRAND NAME(S): Prolia, XGEVA What should I tell my health care provider before I take this medicine? They need to know if you have any of these conditions: -dental disease -having surgery or tooth extraction -infection -kidney disease -low levels of calcium or Vitamin D in the blood -malnutrition -on hemodialysis -skin conditions or sensitivity -thyroid or parathyroid disease -an unusual reaction to denosumab, other medicines, foods, dyes, or preservatives -pregnant or trying to get pregnant -breast-feeding How should I use this medicine? This medicine is for injection under the skin. It is given by a health care professional in a hospital or clinic setting. If you are getting Prolia, a special MedGuide will be given to you by the pharmacist with each prescription and refill. Be sure to read this information carefully each time. For Prolia, talk to your pediatrician regarding the use of this medicine in children. Special care may  be needed. For Xgeva, talk to your pediatrician regarding the use of this medicine in children. While this drug may be prescribed for children as young as 13 years for selected conditions, precautions do apply. Overdosage: If you think you have taken too much of this medicine contact a poison control center or emergency room at once. NOTE: This medicine is only for you. Do not share this medicine with others. What if I miss a dose? It is important not to miss your dose. Call your doctor or health care professional if you are unable to keep an appointment. What may interact with this medicine? Do not take this medicine with any of the following medications: -other medicines containing denosumab This medicine may also interact with the following medications: -medicines that lower your chance of fighting infection -steroid medicines like prednisone or cortisone This list may not describe all possible interactions. Give your health care provider a list of all the medicines, herbs, non-prescription drugs, or dietary supplements you use. Also tell them if you smoke, drink alcohol, or use illegal drugs. Some items may interact with your medicine. What should I watch for while using this medicine? Visit your doctor or health care professional for regular checks on your progress. Your doctor or health care professional may order blood tests and other tests to see how you are doing. Call your doctor or health care professional for advice if you get a fever, chills or sore   throat, or other symptoms of a cold or flu. Do not treat yourself. This drug may decrease your body's ability to fight infection. Try to avoid being around people who are sick. You should make sure you get enough calcium and vitamin D while you are taking this medicine, unless your doctor tells you not to. Discuss the foods you eat and the vitamins you take with your health care professional. See your dentist regularly. Brush and floss your  teeth as directed. Before you have any dental work done, tell your dentist you are receiving this medicine. Do not become pregnant while taking this medicine or for 5 months after stopping it. Talk with your doctor or health care professional about your birth control options while taking this medicine. Women should inform their doctor if they wish to become pregnant or think they might be pregnant. There is a potential for serious side effects to an unborn child. Talk to your health care professional or pharmacist for more information. What side effects may I notice from receiving this medicine? Side effects that you should report to your doctor or health care professional as soon as possible: -allergic reactions like skin rash, itching or hives, swelling of the face, lips, or tongue -bone pain -breathing problems -dizziness -jaw pain, especially after dental work -redness, blistering, peeling of the skin -signs and symptoms of infection like fever or chills; cough; sore throat; pain or trouble passing urine -signs of low calcium like fast heartbeat, muscle cramps or muscle pain; pain, tingling, numbness in the hands or feet; seizures -unusual bleeding or bruising -unusually weak or tired Side effects that usually do not require medical attention (report to your doctor or health care professional if they continue or are bothersome): -constipation -diarrhea -headache -joint pain -loss of appetite -muscle pain -runny nose -tiredness -upset stomach This list may not describe all possible side effects. Call your doctor for medical advice about side effects. You may report side effects to FDA at 1-800-FDA-1088. Where should I keep my medicine? This medicine is only given in a clinic, doctor's office, or other health care setting and will not be stored at home. NOTE: This sheet is a summary. It may not cover all possible information. If you have questions about this medicine, talk to your  doctor, pharmacist, or health care provider.  2018 Elsevier/Gold Standard (2016-07-01 19:17:21)  

## 2018-03-12 LAB — CANCER ANTIGEN 27.29: CA 27.29: 25.1 U/mL (ref 0.0–38.6)

## 2018-04-08 ENCOUNTER — Inpatient Hospital Stay: Payer: Medicare Other | Attending: Hematology & Oncology | Admitting: Family

## 2018-04-08 ENCOUNTER — Inpatient Hospital Stay: Payer: Medicare Other

## 2018-04-08 ENCOUNTER — Encounter: Payer: Self-pay | Admitting: Family

## 2018-04-08 ENCOUNTER — Other Ambulatory Visit: Payer: Self-pay

## 2018-04-08 VITALS — BP 151/71 | HR 73 | Temp 97.9°F | Resp 18 | Wt 153.5 lb

## 2018-04-08 DIAGNOSIS — C7951 Secondary malignant neoplasm of bone: Secondary | ICD-10-CM | POA: Diagnosis not present

## 2018-04-08 DIAGNOSIS — Z5112 Encounter for antineoplastic immunotherapy: Secondary | ICD-10-CM | POA: Insufficient documentation

## 2018-04-08 DIAGNOSIS — C50411 Malignant neoplasm of upper-outer quadrant of right female breast: Secondary | ICD-10-CM

## 2018-04-08 DIAGNOSIS — C50911 Malignant neoplasm of unspecified site of right female breast: Secondary | ICD-10-CM

## 2018-04-08 DIAGNOSIS — Z170 Estrogen receptor positive status [ER+]: Principal | ICD-10-CM

## 2018-04-08 DIAGNOSIS — G629 Polyneuropathy, unspecified: Secondary | ICD-10-CM

## 2018-04-08 LAB — CMP (CANCER CENTER ONLY)
ALBUMIN: 4 g/dL (ref 3.5–5.0)
ALT: 20 U/L (ref 10–47)
ANION GAP: 4 — AB (ref 5–15)
AST: 26 U/L (ref 11–38)
Alkaline Phosphatase: 86 U/L — ABNORMAL HIGH (ref 26–84)
BUN: 18 mg/dL (ref 7–22)
CALCIUM: 8.7 mg/dL (ref 8.0–10.3)
CO2: 29 mmol/L (ref 18–33)
CREATININE: 1.3 mg/dL — AB (ref 0.60–1.20)
Chloride: 107 mmol/L (ref 98–108)
GLUCOSE: 99 mg/dL (ref 73–118)
Potassium: 3.5 mmol/L (ref 3.3–4.7)
Sodium: 140 mmol/L (ref 128–145)
Total Bilirubin: 0.9 mg/dL (ref 0.2–1.6)
Total Protein: 8.1 g/dL (ref 6.4–8.1)

## 2018-04-08 LAB — CBC WITH DIFFERENTIAL (CANCER CENTER ONLY)
Abs Immature Granulocytes: 0.01 10*3/uL (ref 0.00–0.07)
BASOS ABS: 0 10*3/uL (ref 0.0–0.1)
BASOS PCT: 1 %
EOS ABS: 0.1 10*3/uL (ref 0.0–0.5)
Eosinophils Relative: 2 %
HCT: 32.8 % — ABNORMAL LOW (ref 36.0–46.0)
Hemoglobin: 9.7 g/dL — ABNORMAL LOW (ref 12.0–15.0)
IMMATURE GRANULOCYTES: 0 %
Lymphocytes Relative: 28 %
Lymphs Abs: 1.1 10*3/uL (ref 0.7–4.0)
MCH: 25.3 pg — ABNORMAL LOW (ref 26.0–34.0)
MCHC: 29.6 g/dL — ABNORMAL LOW (ref 30.0–36.0)
MCV: 85.6 fL (ref 80.0–100.0)
Monocytes Absolute: 0.6 10*3/uL (ref 0.1–1.0)
Monocytes Relative: 14 %
NEUTROS PCT: 55 %
NRBC: 0 % (ref 0.0–0.2)
Neutro Abs: 2.3 10*3/uL (ref 1.7–7.7)
PLATELETS: 279 10*3/uL (ref 150–400)
RBC: 3.83 MIL/uL — AB (ref 3.87–5.11)
RDW: 14.7 % (ref 11.5–15.5)
WBC Count: 4.1 10*3/uL (ref 4.0–10.5)

## 2018-04-08 MED ORDER — SODIUM CHLORIDE 0.9 % IV SOLN
Freq: Once | INTRAVENOUS | Status: AC
  Administered 2018-04-08: 11:00:00 via INTRAVENOUS
  Filled 2018-04-08: qty 250

## 2018-04-08 MED ORDER — SODIUM CHLORIDE 0.9 % IV SOLN
20.0000 mg | Freq: Once | INTRAVENOUS | Status: AC
  Administered 2018-04-08: 20 mg via INTRAVENOUS
  Filled 2018-04-08: qty 2

## 2018-04-08 MED ORDER — HEPARIN SOD (PORK) LOCK FLUSH 100 UNIT/ML IV SOLN
500.0000 [IU] | Freq: Once | INTRAVENOUS | Status: AC | PRN
  Administered 2018-04-08: 500 [IU]
  Filled 2018-04-08: qty 5

## 2018-04-08 MED ORDER — DIPHENHYDRAMINE HCL 50 MG/ML IJ SOLN
12.5000 mg | Freq: Once | INTRAMUSCULAR | Status: AC
  Administered 2018-04-08: 12.5 mg via INTRAVENOUS

## 2018-04-08 MED ORDER — DIPHENHYDRAMINE HCL 50 MG/ML IJ SOLN
INTRAMUSCULAR | Status: AC
  Filled 2018-04-08: qty 1

## 2018-04-08 MED ORDER — TRASTUZUMAB CHEMO 150 MG IV SOLR
400.0000 mg | Freq: Once | INTRAVENOUS | Status: AC
  Administered 2018-04-08: 400 mg via INTRAVENOUS
  Filled 2018-04-08: qty 19.05

## 2018-04-08 MED ORDER — SODIUM CHLORIDE 0.9% FLUSH
10.0000 mL | INTRAVENOUS | Status: DC | PRN
  Administered 2018-04-08: 10 mL
  Filled 2018-04-08: qty 10

## 2018-04-08 NOTE — Patient Instructions (Signed)
Implanted Port Home Guide An implanted port is a type of central line that is placed under the skin. Central lines are used to provide IV access when treatment or nutrition needs to be given through a person's veins. Implanted ports are used for long-term IV access. An implanted port may be placed because:  You need IV medicine that would be irritating to the small veins in your hands or arms.  You need long-term IV medicines, such as antibiotics.  You need IV nutrition for a long period.  You need frequent blood draws for lab tests.  You need dialysis.  Implanted ports are usually placed in the chest area, but they can also be placed in the upper arm, the abdomen, or the leg. An implanted port has two main parts:  Reservoir. The reservoir is round and will appear as a small, raised area under your skin. The reservoir is the part where a needle is inserted to give medicines or draw blood.  Catheter. The catheter is a thin, flexible tube that extends from the reservoir. The catheter is placed into a large vein. Medicine that is inserted into the reservoir goes into the catheter and then into the vein.  How will I care for my incision site? Do not get the incision site wet. Bathe or shower as directed by your health care provider. How is my port accessed? Special steps must be taken to access the port:  Before the port is accessed, a numbing cream can be placed on the skin. This helps numb the skin over the port site.  Your health care provider uses a sterile technique to access the port. ? Your health care provider must put on a mask and sterile gloves. ? The skin over your port is cleaned carefully with an antiseptic and allowed to dry. ? The port is gently pinched between sterile gloves, and a needle is inserted into the port.  Only "non-coring" port needles should be used to access the port. Once the port is accessed, a blood return should be checked. This helps ensure that the port  is in the vein and is not clogged.  If your port needs to remain accessed for a constant infusion, a clear (transparent) bandage will be placed over the needle site. The bandage and needle will need to be changed every week, or as directed by your health care provider.  Keep the bandage covering the needle clean and dry. Do not get it wet. Follow your health care provider's instructions on how to take a shower or bath while the port is accessed.  If your port does not need to stay accessed, no bandage is needed over the port.  What is flushing? Flushing helps keep the port from getting clogged. Follow your health care provider's instructions on how and when to flush the port. Ports are usually flushed with saline solution or a medicine called heparin. The need for flushing will depend on how the port is used.  If the port is used for intermittent medicines or blood draws, the port will need to be flushed: ? After medicines have been given. ? After blood has been drawn. ? As part of routine maintenance.  If a constant infusion is running, the port may not need to be flushed.  How long will my port stay implanted? The port can stay in for as long as your health care provider thinks it is needed. When it is time for the port to come out, surgery will be   done to remove it. The procedure is similar to the one performed when the port was put in. When should I seek immediate medical care? When you have an implanted port, you should seek immediate medical care if:  You notice a bad smell coming from the incision site.  You have swelling, redness, or drainage at the incision site.  You have more swelling or pain at the port site or the surrounding area.  You have a fever that is not controlled with medicine.  This information is not intended to replace advice given to you by your health care provider. Make sure you discuss any questions you have with your health care provider. Document  Released: 06/09/2005 Document Revised: 11/15/2015 Document Reviewed: 02/14/2013 Elsevier Interactive Patient Education  2017 [employer]  

## 2018-04-08 NOTE — Progress Notes (Signed)
Hematology and Oncology Follow Up Visit  Michelle Bowen [id number] Apr 13, 1941 77 y.o. 04/08/2018   Principle Diagnosis:  Metastatic breast cancer - RIGHT - bone mets - TRIPLE POSITIVE  Current Therapy:   Herceptin 360 mg IV q 4 week - s/p cycle 15 Femara 2.5 mg po q day Xgeva 120 mg IM q 3 months - due again inDecember2019   Interim History:  Michelle Bowen is here today for follow-up. She is doing well and has no complaints at this time.  CA 27.29 was 25.1 in September. Today's level is pending.  She states that she has not been taking her Femara regularly lately but will do better.  No fever, chills, n/v, cough, rash, dizziness, SOB, chest pain, palpitations, abdominal pain or changes in bowel or bladder habits.  No weakness, swelling or tendenress in her extremities. The neuropathy in her feet and hands is stable and unchanged.  She uses her cane when ambulating. No falls or syncopal episodes to report.  No lymphadenopathy noted on exam.  No episodes of bleeding, no bruising or petechiae.  She has been eating well and staying hydrated. Her weight is stable.   ECOG Performance Status: 1 - Symptomatic but completely ambulatory  Medications:  Allergies as of 04/08/2018   No Active Allergies     Medication List        Accurate as of 04/08/18 10:35 AM. Always use your most recent med list.          gabapentin 300 MG capsule Commonly known as:  NEURONTIN Take 1 capsule (300 mg total) by mouth 3 (three) times daily.   letrozole 2.5 MG tablet Commonly known as:  FEMARA Take 1 tablet (2.5 mg total) by mouth daily.   Misc. Devices Misc For Bilateral hip pain   oxyCODONE 5 MG immediate release tablet Commonly known as:  Oxy IR/ROXICODONE Take 1 tablet (5 mg total) by mouth every 6 (six) hours as needed for severe pain.   PROBIOTIC ACIDOPHILUS PO Take by mouth.   triamterene-hydrochlorothiazide 37.5-25 MG capsule Commonly known as:  DYAZIDE Take 1 capsule by mouth  daily.   vitamin B-12 500 MCG tablet Commonly known as:  CYANOCOBALAMIN Take 500 mcg by mouth daily.   Vitamin D3 5000 units Tabs Take by mouth.       Allergies: No Active Allergies  Past Medical History, Surgical history, Social history, and Family History were reviewed and updated.  Review of Systems: All other 10 point review of systems is negative.   Physical Exam:  weight is 153 lb 8 oz (69.6 kg). Her oral temperature is 97.9 F (36.6 C). Her blood pressure is 151/71 (abnormal) and her pulse is 73. Her respiration is 18 and oxygen saturation is 100%.   Wt Readings from Last 3 Encounters:  04/08/18 153 lb 8 oz (69.6 kg)  03/11/18 149 lb 8 oz (67.8 kg)  03/11/18 149 lb 8 oz (67.8 kg)    Ocular: Sclerae unicteric, pupils equal, round and reactive to light Ear-nose-throat: Oropharynx clear, dentition fair Lymphatic: No cervical, supraclavicular or axillary adenopathy Lungs no rales or rhonchi, good excursion bilaterally Heart regular rate and rhythm, no murmur appreciated Abd soft, nontender, positive bowel sounds, no liver or spleen tip palpated on exam, no fluid wave  MSK no focal spinal tenderness, no joint edema Neuro: non-focal, well-oriented, appropriate affect Breasts: No changes. No mass, lesion or rash.   Lab Results  Component Value Date   WBC 4.1 04/08/2018   HGB 9.7 (L)  04/08/2018   HCT 32.8 (L) 04/08/2018   MCV 85.6 04/08/2018   PLT 279 04/08/2018   Lab Results  Component Value Date   FERRITIN 12 12/17/2017   IRON 57 12/17/2017   TIBC 447 12/17/2017   UIBC 390 12/17/2017   IRONPCTSAT 13 12/17/2017   Lab Results  Component Value Date   RBC 3.83 (L) 04/08/2018   No results found for: KPAFRELGTCHN, LAMBDASER, KAPLAMBRATIO No results found for: IGGSERUM, IGA, IGMSERUM No results found for: Kathrynn Ducking, MSPIKE, SPEI   Chemistry      Component Value Date/Time   NA 140 04/08/2018 0934   NA 147 (H)  06/11/2017 1345   NA 139 04/23/2017 1256   K 3.5 04/08/2018 0934   K 4.3 06/11/2017 1345   K 4.2 04/23/2017 1256   CL 107 04/08/2018 0934   CL 106 06/11/2017 1345   CO2 29 04/08/2018 0934   CO2 27 06/11/2017 1345   CO2 28 04/23/2017 1256   BUN 18 04/08/2018 0934   BUN 13 06/11/2017 1345   BUN 10.0 04/23/2017 1256   CREATININE 1.30 (H) 04/08/2018 0934   CREATININE 1.1 06/11/2017 1345   CREATININE 0.8 04/23/2017 1256      Component Value Date/Time   CALCIUM 8.7 04/08/2018 0934   CALCIUM 8.9 06/11/2017 1345   CALCIUM 8.7 04/23/2017 1256   ALKPHOS 86 (H) 04/08/2018 0934   ALKPHOS 85 (H) 06/11/2017 1345   ALKPHOS 104 04/23/2017 1256   AST 26 04/08/2018 0934   AST 15 04/23/2017 1256   ALT 20 04/08/2018 0934   ALT 17 06/11/2017 1345   ALT <6 04/23/2017 1256   BILITOT 0.9 04/08/2018 0934   BILITOT 0.59 04/23/2017 1256       Impression and Plan: Michelle Bowen is a very pleasant 77 yo African [place] female with metastatic breast cancer, triple positive. She is doing well and has no complaints at this time.  We will proceed with Herceptin today as planned.  She will do her Hirschmann to take her Femara daily as prescribed.  CA 27.29 is pending.  We will plan to see her back in another 4 weeks.  She will contact our office with any questions or concerns. We can certainly see her sooner if need be.   Michelle Peace, NP 10/17/201910:35 AM

## 2018-04-08 NOTE — Patient Instructions (Signed)
[facility] Discharge Instructions for Patients Receiving Chemotherapy Today you received the following chemotherapy agents:  Herceptin To help prevent nausea and vomiting after your treatment, we encourage you to take your nausea medication as prescribed.   If you develop nausea and vomiting that is not controlled by your nausea medication, call the clinic.   BELOW ARE SYMPTOMS THAT SHOULD BE REPORTED IMMEDIATELY:  *FEVER GREATER THAN 100.5 F  *CHILLS WITH OR WITHOUT FEVER  NAUSEA AND VOMITING THAT IS NOT CONTROLLED WITH YOUR NAUSEA MEDICATION  *UNUSUAL SHORTNESS OF BREATH  *UNUSUAL BRUISING OR BLEEDING  TENDERNESS IN MOUTH AND THROAT WITH OR WITHOUT PRESENCE OF ULCERS  *URINARY PROBLEMS  *BOWEL PROBLEMS  UNUSUAL RASH Items with * indicate a potential emergency and should be followed up as soon as possible.  Feel free to call the clinic should you have any questions or concerns. The clinic phone number is [phone].  Please show the CHEMO ALERT CARD at check-in to the Emergency Department and triage nurse.   

## 2018-04-09 LAB — CANCER ANTIGEN 27.29: CAN 27.29: 31.5 U/mL (ref 0.0–38.6)

## 2018-05-06 ENCOUNTER — Inpatient Hospital Stay: Payer: Medicare Other | Attending: Hematology & Oncology | Admitting: Family

## 2018-05-06 ENCOUNTER — Inpatient Hospital Stay: Payer: Medicare Other

## 2018-05-06 ENCOUNTER — Encounter: Payer: Self-pay | Admitting: Family

## 2018-05-06 ENCOUNTER — Other Ambulatory Visit: Payer: Self-pay

## 2018-05-06 ENCOUNTER — Other Ambulatory Visit: Payer: Self-pay | Admitting: Family

## 2018-05-06 ENCOUNTER — Telehealth: Payer: Self-pay | Admitting: Hematology & Oncology

## 2018-05-06 VITALS — BP 175/84 | HR 66 | Temp 97.8°F | Resp 18 | Wt 155.8 lb

## 2018-05-06 DIAGNOSIS — Z170 Estrogen receptor positive status [ER+]: Secondary | ICD-10-CM

## 2018-05-06 DIAGNOSIS — Z5112 Encounter for antineoplastic immunotherapy: Secondary | ICD-10-CM | POA: Diagnosis present

## 2018-05-06 DIAGNOSIS — C50411 Malignant neoplasm of upper-outer quadrant of right female breast: Secondary | ICD-10-CM

## 2018-05-06 DIAGNOSIS — C7951 Secondary malignant neoplasm of bone: Secondary | ICD-10-CM

## 2018-05-06 DIAGNOSIS — D508 Other iron deficiency anemias: Secondary | ICD-10-CM

## 2018-05-06 DIAGNOSIS — C50911 Malignant neoplasm of unspecified site of right female breast: Secondary | ICD-10-CM

## 2018-05-06 LAB — CBC WITH DIFFERENTIAL (CANCER CENTER ONLY)
ABS IMMATURE GRANULOCYTES: 0.01 10*3/uL (ref 0.00–0.07)
Basophils Absolute: 0 10*3/uL (ref 0.0–0.1)
Basophils Relative: 0 %
EOS PCT: 2 %
Eosinophils Absolute: 0.1 10*3/uL (ref 0.0–0.5)
HEMATOCRIT: 32.2 % — AB (ref 36.0–46.0)
HEMOGLOBIN: 9.7 g/dL — AB (ref 12.0–15.0)
Immature Granulocytes: 0 %
LYMPHS ABS: 1.3 10*3/uL (ref 0.7–4.0)
LYMPHS PCT: 27 %
MCH: 25.9 pg — AB (ref 26.0–34.0)
MCHC: 30.1 g/dL (ref 30.0–36.0)
MCV: 85.9 fL (ref 80.0–100.0)
MONO ABS: 0.4 10*3/uL (ref 0.1–1.0)
MONOS PCT: 9 %
NEUTROS ABS: 3 10*3/uL (ref 1.7–7.7)
Neutrophils Relative %: 62 %
Platelet Count: 252 10*3/uL (ref 150–400)
RBC: 3.75 MIL/uL — AB (ref 3.87–5.11)
RDW: 14.7 % (ref 11.5–15.5)
WBC: 4.8 10*3/uL (ref 4.0–10.5)
nRBC: 0 % (ref 0.0–0.2)

## 2018-05-06 LAB — CMP (CANCER CENTER ONLY)
ALT: 10 U/L (ref 10–47)
ANION GAP: 8 (ref 5–15)
AST: 24 U/L (ref 11–38)
Albumin: 4.2 g/dL (ref 3.5–5.0)
Alkaline Phosphatase: 62 U/L (ref 26–84)
BUN: 22 mg/dL (ref 7–22)
CO2: 30 mmol/L (ref 18–33)
Calcium: 9.6 mg/dL (ref 8.0–10.3)
Chloride: 102 mmol/L (ref 98–108)
Creatinine: 1.1 mg/dL (ref 0.60–1.20)
Glucose, Bld: 95 mg/dL (ref 73–118)
POTASSIUM: 3.8 mmol/L (ref 3.3–4.7)
SODIUM: 140 mmol/L (ref 128–145)
Total Bilirubin: 1 mg/dL (ref 0.2–1.6)
Total Protein: 8.1 g/dL (ref 6.4–8.1)

## 2018-05-06 MED ORDER — SODIUM CHLORIDE 0.9 % IV SOLN
20.0000 mg | Freq: Once | INTRAVENOUS | Status: AC
  Administered 2018-05-06: 20 mg via INTRAVENOUS
  Filled 2018-05-06: qty 2

## 2018-05-06 MED ORDER — SODIUM CHLORIDE 0.9 % IV SOLN
Freq: Once | INTRAVENOUS | Status: AC
  Administered 2018-05-06: 12:00:00 via INTRAVENOUS
  Filled 2018-05-06: qty 250

## 2018-05-06 MED ORDER — SODIUM CHLORIDE 0.9% FLUSH
10.0000 mL | INTRAVENOUS | Status: DC | PRN
  Administered 2018-05-06: 10 mL
  Filled 2018-05-06: qty 10

## 2018-05-06 MED ORDER — HEPARIN SOD (PORK) LOCK FLUSH 100 UNIT/ML IV SOLN
500.0000 [IU] | Freq: Once | INTRAVENOUS | Status: AC | PRN
  Administered 2018-05-06: 500 [IU]
  Filled 2018-05-06: qty 5

## 2018-05-06 MED ORDER — DIPHENHYDRAMINE HCL 50 MG/ML IJ SOLN
12.5000 mg | Freq: Once | INTRAMUSCULAR | Status: AC
  Administered 2018-05-06: 12.5 mg via INTRAVENOUS

## 2018-05-06 MED ORDER — OXYCODONE HCL 5 MG PO TABS: 5.0000 mg | ORAL_TABLET | Freq: Four times a day (QID) | ORAL | 0 refills | Status: DC | PRN

## 2018-05-06 MED ORDER — TRASTUZUMAB CHEMO 150 MG IV SOLR
400.0000 mg | Freq: Once | INTRAVENOUS | Status: AC
  Administered 2018-05-06: 400 mg via INTRAVENOUS
  Filled 2018-05-06: qty 19.05

## 2018-05-06 MED ORDER — DIPHENHYDRAMINE HCL 50 MG/ML IJ SOLN
INTRAMUSCULAR | Status: AC
  Filled 2018-05-06: qty 1

## 2018-05-06 NOTE — Progress Notes (Signed)
Hematology and Oncology Follow Up Visit  SONIYA ASHRAF [id number] 1940-09-11 77 y.o. 05/06/2018   Principle Diagnosis:  Metastatic breast cancer - RIGHT - bone mets - TRIPLE POSITIVE  Current Therapy:   Herceptin 360 mg IV q 4 week - s/p cycle 15 Femara 2.5 mg po q day Xgeva 120 mg IM q 3 months - due again inDecember2019   Interim History:  Ms. Beckers is here today for follow-up. She states that she is always a little anxious when she comes in which contributes to her HTN.  She has had some mild fatigue and will take a break to rest when needed. She is sleeping well at night.  She rarely has night sweats. No hot flashes. She verbalized that she is taking her Femara daily as prescribed.  No fever, chills, n/v, cough, rash, dizziness, SOB, chest pain, palpitations, abdominal pain or changes in bowel or bladder habits.  No swelling, tenderness, numbness ir tingling in her extremities at this time.  She has chronic lower back pain that comes and goes.  No lymphadenopathy noted on exam.  She has maintained a good appetite and is staying well hydrated. Her weight is stable.   ECOG Performance Status: 1 - Symptomatic but completely ambulatory  Medications:  Allergies as of 05/06/2018   No Active Allergies     Medication List        Accurate as of 05/06/18 10:51 AM. Always use your most recent med list.          gabapentin 300 MG capsule Commonly known as:  NEURONTIN Take 1 capsule (300 mg total) by mouth 3 (three) times daily.   letrozole 2.5 MG tablet Commonly known as:  FEMARA Take 1 tablet (2.5 mg total) by mouth daily.   Misc. Devices Misc For Bilateral hip pain   oxyCODONE 5 MG immediate release tablet Commonly known as:  Oxy IR/ROXICODONE Take 1 tablet (5 mg total) by mouth every 6 (six) hours as needed for severe pain.   PROBIOTIC ACIDOPHILUS PO Take by mouth.   triamterene-hydrochlorothiazide 37.5-25 MG capsule Commonly known as:  DYAZIDE Take 1 capsule  by mouth daily.   vitamin B-12 500 MCG tablet Commonly known as:  CYANOCOBALAMIN Take 500 mcg by mouth daily.   Vitamin D3 125 MCG (5000 UT) Tabs Take by mouth.       Allergies: No Active Allergies  Past Medical History, Surgical history, Social history, and Family History were reviewed and updated.  Review of Systems: All other 10 point review of systems is negative.   Physical Exam:  weight is 155 lb 12 oz (70.6 kg). Her oral temperature is 97.8 F (36.6 C). Her blood pressure is 175/84 (abnormal) and her pulse is 66. Her respiration is 18 and oxygen saturation is 100%.   Wt Readings from Last 3 Encounters:  05/06/18 155 lb 12 oz (70.6 kg)  04/08/18 153 lb 8 oz (69.6 kg)  03/11/18 149 lb 8 oz (67.8 kg)    Ocular: Sclerae unicteric, pupils equal, round and reactive to light Ear-nose-throat: Oropharynx clear, dentition fair Lymphatic: No cervical, supraclavicular or axillary adenopathy Lungs no rales or rhonchi, good excursion bilaterally Heart regular rate and rhythm, no murmur appreciated Abd soft, nontender, positive bowel sounds, no liver or spleen tip palpated on exam, no fluid wave  MSK no focal spinal tenderness, no joint edema Neuro: non-focal, well-oriented, appropriate affect Breasts: Deferred   Lab Results  Component Value Date   WBC 4.8 05/06/2018   HGB 9.7 (  L) 05/06/2018   HCT 32.2 (L) 05/06/2018   MCV 85.9 05/06/2018   PLT 252 05/06/2018   Lab Results  Component Value Date   FERRITIN 12 12/17/2017   IRON 57 12/17/2017   TIBC 447 12/17/2017   UIBC 390 12/17/2017   IRONPCTSAT 13 12/17/2017   Lab Results  Component Value Date   RBC 3.75 (L) 05/06/2018   No results found for: KPAFRELGTCHN, LAMBDASER, KAPLAMBRATIO No results found for: IGGSERUM, IGA, IGMSERUM No results found for: Odetta Pink, SPEI   Chemistry      Component Value Date/Time   NA 140 05/06/2018 1010   NA 147 (H) 06/11/2017  1345   NA 139 04/23/2017 1256   K 3.8 05/06/2018 1010   K 4.3 06/11/2017 1345   K 4.2 04/23/2017 1256   CL 102 05/06/2018 1010   CL 106 06/11/2017 1345   CO2 30 05/06/2018 1010   CO2 27 06/11/2017 1345   CO2 28 04/23/2017 1256   BUN 22 05/06/2018 1010   BUN 13 06/11/2017 1345   BUN 10.0 04/23/2017 1256   CREATININE 1.10 05/06/2018 1010   CREATININE 1.1 06/11/2017 1345   CREATININE 0.8 04/23/2017 1256      Component Value Date/Time   CALCIUM 9.6 05/06/2018 1010   CALCIUM 8.9 06/11/2017 1345   CALCIUM 8.7 04/23/2017 1256   ALKPHOS 62 05/06/2018 1010   ALKPHOS 85 (H) 06/11/2017 1345   ALKPHOS 104 04/23/2017 1256   AST 24 05/06/2018 1010   AST 15 04/23/2017 1256   ALT 10 05/06/2018 1010   ALT 17 06/11/2017 1345   ALT <6 04/23/2017 1256   BILITOT 1.0 05/06/2018 1010   BILITOT 0.59 04/23/2017 1256       Impression and Plan: Ms. Busbin is a very pleasant 78 yo African American female with metastatic breast cancer, triple positive. She continues to do well and is tolerating treatment nicely.  We will proceed with treatment today as planned.  CA 27.29 pending. Last level in October was 31.5.  We will scheduled her for CT of the chest, abdomen and pelvis as well as a NM whole body bone scan.  Once we have these results if her disease is stable we will move her treatments out to ever 6 weeks per Dr. Marin Olp.  She has her current schedule with follow-up in 4 weeks. We will keep this in place for now.  She will contact our office with any questions or concerns. We can certainly see her sooner if need be.   Laverna Peace, NP 11/14/201910:51 AM

## 2018-05-06 NOTE — Patient Instructions (Signed)
Trastuzumab injection for infusion What is this medicine? TRASTUZUMAB (tras TOO zoo mab) is a monoclonal antibody. It is used to treat breast cancer and stomach cancer. This medicine may be used for other purposes; ask your health care provider or pharmacist if you have questions. COMMON BRAND NAME(S): Herceptin What should I tell my health care provider before I take this medicine? They need to know if you have any of these conditions: -heart disease -heart failure -lung or breathing disease, like asthma -an unusual or allergic reaction to trastuzumab, benzyl alcohol, or other medications, foods, dyes, or preservatives -pregnant or trying to get pregnant -breast-feeding How should I use this medicine? This drug is given as an infusion into a vein. It is administered in a hospital or clinic by a specially trained health care professional. Talk to your pediatrician regarding the use of this medicine in children. This medicine is not approved for use in children. Overdosage: If you think you have taken too much of this medicine contact a poison control center or emergency room at once. NOTE: This medicine is only for you. Do not share this medicine with others. What if I miss a dose? It is important not to miss a dose. Call your doctor or health care professional if you are unable to keep an appointment. What may interact with this medicine? This medicine may interact with the following medications: -certain types of chemotherapy, such as daunorubicin, doxorubicin, epirubicin, and idarubicin This list may not describe all possible interactions. Give your health care provider a list of all the medicines, herbs, non-prescription drugs, or dietary supplements you use. Also tell them if you smoke, drink alcohol, or use illegal drugs. Some items may interact with your medicine. What should I watch for while using this medicine? Visit your doctor for checks on your progress. Report any side effects.  Continue your course of treatment even though you feel ill unless your doctor tells you to stop. Call your doctor or health care professional for advice if you get a fever, chills or sore throat, or other symptoms of a cold or flu. Do not treat yourself. Try to avoid being around people who are sick. You may experience fever, chills and shaking during your first infusion. These effects are usually mild and can be treated with other medicines. Report any side effects during the infusion to your health care professional. Fever and chills usually do not happen with later infusions. Do not become pregnant while taking this medicine or for 7 months after stopping it. Women should inform their doctor if they wish to become pregnant or think they might be pregnant. Women of child-bearing potential will need to have a negative pregnancy test before starting this medicine. There is a potential for serious side effects to an unborn child. Talk to your health care professional or pharmacist for more information. Do not breast-feed an infant while taking this medicine or for 7 months after stopping it. Women must use effective birth control with this medicine. What side effects may I notice from receiving this medicine? Side effects that you should report to your doctor or health care professional as soon as possible: -allergic reactions like skin rash, itching or hives, swelling of the face, lips, or tongue -chest pain or palpitations -cough -dizziness -feeling faint or lightheaded, falls -fever -general ill feeling or flu-like symptoms -signs of worsening heart failure like breathing problems; swelling in your legs and feet -unusually weak or tired Side effects that usually do not require medical   attention (report to your doctor or health care professional if they continue or are bothersome): -bone pain -changes in taste -diarrhea -joint pain -nausea/vomiting -weight loss This list may not describe all  possible side effects. Call your doctor for medical advice about side effects. You may report side effects to FDA at 1-800-FDA-1088. Where should I keep my medicine? This drug is given in a hospital or clinic and will not be stored at home. NOTE: This sheet is a summary. It may not cover all possible information. If you have questions about this medicine, talk to your doctor, pharmacist, or health care provider.  2018 Elsevier/Gold Standard (2016-06-03 14:37:52)  

## 2018-05-06 NOTE — Telephone Encounter (Signed)
Bone Scan & Ct's have been scheduled and LMVM for patient with date/time/location and instructions regarding her to pick up contrast material and NPO 4 hours prior

## 2018-05-06 NOTE — Patient Instructions (Signed)
Implanted Port Home Guide An implanted port is a type of central line that is placed under the skin. Central lines are used to provide IV access when treatment or nutrition needs to be given through a person's veins. Implanted ports are used for long-term IV access. An implanted port may be placed because:  You need IV medicine that would be irritating to the small veins in your hands or arms.  You need long-term IV medicines, such as antibiotics.  You need IV nutrition for a long period.  You need frequent blood draws for lab tests.  You need dialysis.  Implanted ports are usually placed in the chest area, but they can also be placed in the upper arm, the abdomen, or the leg. An implanted port has two main parts:  Reservoir. The reservoir is round and will appear as a small, raised area under your skin. The reservoir is the part where a needle is inserted to give medicines or draw blood.  Catheter. The catheter is a thin, flexible tube that extends from the reservoir. The catheter is placed into a large vein. Medicine that is inserted into the reservoir goes into the catheter and then into the vein.  How will I care for my incision site? Do not get the incision site wet. Bathe or shower as directed by your health care provider. How is my port accessed? Special steps must be taken to access the port:  Before the port is accessed, a numbing cream can be placed on the skin. This helps numb the skin over the port site.  Your health care provider uses a sterile technique to access the port. ? Your health care provider must put on a mask and sterile gloves. ? The skin over your port is cleaned carefully with an antiseptic and allowed to dry. ? The port is gently pinched between sterile gloves, and a needle is inserted into the port.  Only "non-coring" port needles should be used to access the port. Once the port is accessed, a blood return should be checked. This helps ensure that the port  is in the vein and is not clogged.  If your port needs to remain accessed for a constant infusion, a clear (transparent) bandage will be placed over the needle site. The bandage and needle will need to be changed every week, or as directed by your health care provider.  Keep the bandage covering the needle clean and dry. Do not get it wet. Follow your health care provider's instructions on how to take a shower or bath while the port is accessed.  If your port does not need to stay accessed, no bandage is needed over the port.  What is flushing? Flushing helps keep the port from getting clogged. Follow your health care provider's instructions on how and when to flush the port. Ports are usually flushed with saline solution or a medicine called heparin. The need for flushing will depend on how the port is used.  If the port is used for intermittent medicines or blood draws, the port will need to be flushed: ? After medicines have been given. ? After blood has been drawn. ? As part of routine maintenance.  If a constant infusion is running, the port may not need to be flushed.  How long will my port stay implanted? The port can stay in for as long as your health care provider thinks it is needed. When it is time for the port to come out, surgery will be   done to remove it. The procedure is similar to the one performed when the port was put in. When should I seek immediate medical care? When you have an implanted port, you should seek immediate medical care if:  You notice a bad smell coming from the incision site.  You have swelling, redness, or drainage at the incision site.  You have more swelling or pain at the port site or the surrounding area.  You have a fever that is not controlled with medicine.  This information is not intended to replace advice given to you by your health care provider. Make sure you discuss any questions you have with your health care provider. Document  Released: 06/09/2005 Document Revised: 11/15/2015 Document Reviewed: 02/14/2013 Elsevier Interactive Patient Education  2017 [employer]  

## 2018-05-07 LAB — CANCER ANTIGEN 27.29: CAN 27.29: 34.6 U/mL (ref 0.0–38.6)

## 2018-05-24 ENCOUNTER — Ambulatory Visit (HOSPITAL_COMMUNITY)
Admission: RE | Admit: 2018-05-24 | Discharge: 2018-05-24 | Disposition: A | Discharge status: Home / Self Care | Payer: Medicare Other | Source: Ambulatory Visit | Attending: Family | Admitting: Family

## 2018-05-24 ENCOUNTER — Encounter (HOSPITAL_COMMUNITY)
Admission: RE | Admit: 2018-05-24 | Discharge: 2018-05-24 | Disposition: A | Discharge status: Home / Self Care | Payer: Medicare Other | Source: Ambulatory Visit | Attending: Family | Admitting: Family

## 2018-05-24 ENCOUNTER — Encounter (HOSPITAL_COMMUNITY): Payer: Self-pay

## 2018-05-24 DIAGNOSIS — Z170 Estrogen receptor positive status [ER+]: Principal | ICD-10-CM

## 2018-05-24 DIAGNOSIS — C7951 Secondary malignant neoplasm of bone: Secondary | ICD-10-CM | POA: Diagnosis present

## 2018-05-24 DIAGNOSIS — C50411 Malignant neoplasm of upper-outer quadrant of right female breast: Secondary | ICD-10-CM

## 2018-05-24 HISTORY — DX: Secondary malignant neoplasm of bone: C79.51

## 2018-05-24 MED ORDER — IOHEXOL 300 MG/ML  SOLN
100.0000 mL | Freq: Once | INTRAMUSCULAR | Status: AC | PRN
  Administered 2018-05-24: 100 mL via INTRAVENOUS

## 2018-05-24 MED ORDER — SODIUM CHLORIDE (PF) 0.9 % IJ SOLN
INTRAMUSCULAR | Status: AC
  Filled 2018-05-24: qty 50

## 2018-05-24 MED ORDER — TECHNETIUM TC 99M MEDRONATE IV KIT
19.7000 | PACK | Freq: Once | INTRAVENOUS | Status: AC | PRN
  Administered 2018-05-24: 19.7 via INTRAVENOUS

## 2018-05-26 ENCOUNTER — Telehealth: Payer: Self-pay | Admitting: Family

## 2018-05-26 ENCOUNTER — Other Ambulatory Visit: Payer: Self-pay | Admitting: Family

## 2018-05-26 DIAGNOSIS — Z170 Estrogen receptor positive status [ER+]: Secondary | ICD-10-CM

## 2018-05-26 DIAGNOSIS — C50411 Malignant neoplasm of upper-outer quadrant of right female breast: Secondary | ICD-10-CM

## 2018-05-26 DIAGNOSIS — C7951 Secondary malignant neoplasm of bone: Secondary | ICD-10-CM

## 2018-05-26 NOTE — Telephone Encounter (Signed)
I spoke with Michelle Bowen going over her bone scan results with her. We will get a CT of the chest to better evaluate the posterior left seventh rib which was concerning for possible new metastasis. She is in agreement with the plan.

## 2018-05-27 ENCOUNTER — Telehealth: Payer: Self-pay | Admitting: Hematology & Oncology

## 2018-05-27 NOTE — Telephone Encounter (Signed)
Spoke with patient regarding appointment for CT Scan on 12/9.

## 2018-05-31 ENCOUNTER — Other Ambulatory Visit: Payer: Self-pay | Admitting: Family

## 2018-05-31 ENCOUNTER — Ambulatory Visit (HOSPITAL_BASED_OUTPATIENT_CLINIC_OR_DEPARTMENT_OTHER)
Admission: RE | Admit: 2018-05-31 | Discharge: 2018-05-31 | Disposition: A | Discharge status: Home / Self Care | Payer: Medicare Other | Source: Ambulatory Visit | Attending: Family | Admitting: Family

## 2018-05-31 ENCOUNTER — Encounter (HOSPITAL_BASED_OUTPATIENT_CLINIC_OR_DEPARTMENT_OTHER): Payer: Self-pay

## 2018-05-31 DIAGNOSIS — C50411 Malignant neoplasm of upper-outer quadrant of right female breast: Secondary | ICD-10-CM | POA: Insufficient documentation

## 2018-05-31 DIAGNOSIS — Z170 Estrogen receptor positive status [ER+]: Secondary | ICD-10-CM | POA: Insufficient documentation

## 2018-05-31 DIAGNOSIS — C7951 Secondary malignant neoplasm of bone: Secondary | ICD-10-CM | POA: Insufficient documentation

## 2018-05-31 MED ORDER — IOPAMIDOL (ISOVUE-300) INJECTION 61%: 100.0000 mL | Freq: Once | INTRAVENOUS | Status: DC | PRN

## 2018-06-03 ENCOUNTER — Inpatient Hospital Stay: Payer: Medicare Other

## 2018-06-03 ENCOUNTER — Encounter: Payer: Self-pay | Admitting: Hematology & Oncology

## 2018-06-03 ENCOUNTER — Other Ambulatory Visit: Payer: Self-pay

## 2018-06-03 ENCOUNTER — Inpatient Hospital Stay: Payer: Medicare Other | Attending: Hematology & Oncology | Admitting: Hematology & Oncology

## 2018-06-03 VITALS — BP 158/76 | HR 65 | Temp 97.9°F | Resp 20 | Wt 156.4 lb

## 2018-06-03 DIAGNOSIS — Z170 Estrogen receptor positive status [ER+]: Secondary | ICD-10-CM | POA: Insufficient documentation

## 2018-06-03 DIAGNOSIS — C7951 Secondary malignant neoplasm of bone: Secondary | ICD-10-CM | POA: Insufficient documentation

## 2018-06-03 DIAGNOSIS — C50911 Malignant neoplasm of unspecified site of right female breast: Secondary | ICD-10-CM | POA: Diagnosis present

## 2018-06-03 DIAGNOSIS — Z452 Encounter for adjustment and management of vascular access device: Secondary | ICD-10-CM | POA: Diagnosis not present

## 2018-06-03 DIAGNOSIS — C50411 Malignant neoplasm of upper-outer quadrant of right female breast: Secondary | ICD-10-CM

## 2018-06-03 NOTE — Progress Notes (Signed)
Hematology and Oncology Follow Up Visit  Michelle Bowen [id number] 05/02/1941 77 y.o. 06/03/2018   Principle Diagnosis:  Metastatic breast cancer - RIGHT - bone mets - TRIPLE POSITIVE  Current Therapy:   Herceptin 360 mg IV q 4 week - s/p cycle 15 Femara 2.5 mg po q day Xgeva 120 mg IM q 3 months - due again inDecember2019   Interim History:  Michelle Bowen is here today for follow-up.  She does not want any treatment today.  She says that the last time she had treatment, she did not feel right.  She thought that we gave her a higher dose of treatment.  She thought that we gave her something different.  I assured her as much as I could that everything was the same for her.  She says she just did not feel well for several days.  She got sick on her stomach.  I told her that I would be surprised if this was from the Herceptin.  She has had no problems with Herceptin.  We did go ahead and re-scan her.  She had a bone scan that was done.  There is a question of a lesion on the left seventh rib.  We did go ahead and do a CT scan.  There was a questionable lesion in the liver.  This was in the right lobe of the liver.  It measured 1 cm.  Naturally, radiology recommended that an MRI be done.  We are now going to order an MRI.    Her CA 27.29 has been going up.  On her last visit, the CA 27.29 was up to 35.  She had a nice Thanksgiving.  She is going to have a nice Christmas.  I think she will spend it by herself.  She really does not celebrate Christmas with presents and gifts.  Overall, her performance status is ECOG 1.   Medications:  Allergies as of 06/03/2018   No Known Allergies     Medication List       Accurate as of June 03, 2018 11:24 AM. Always use your most recent med list.        gabapentin 300 MG capsule Commonly known as:  NEURONTIN Take 1 capsule (300 mg total) by mouth 3 (three) times daily.   letrozole 2.5 MG tablet Commonly known as:  FEMARA Take 1 tablet  (2.5 mg total) by mouth daily.   Misc. Devices Misc For Bilateral hip pain   oxyCODONE 5 MG immediate release tablet Commonly known as:  Oxy IR/ROXICODONE Take 1 tablet (5 mg total) by mouth every 6 (six) hours as needed for severe pain.   PROBIOTIC ACIDOPHILUS PO Take by mouth.   triamterene-hydrochlorothiazide 37.5-25 MG capsule Commonly known as:  DYAZIDE Take 1 capsule by mouth daily.   vitamin B-12 500 MCG tablet Commonly known as:  CYANOCOBALAMIN Take 500 mcg by mouth daily.   Vitamin D3 125 MCG (5000 UT) Tabs Take by mouth.       Allergies: No Known Allergies  Past Medical History, Surgical history, Social history, and Family History were reviewed and updated.  Review of Systems: Review of Systems  Constitutional: Negative.   HENT: Negative.   Eyes: Negative.   Respiratory: Negative.   Cardiovascular: Negative.   Gastrointestinal: Negative.   Genitourinary: Negative.   Musculoskeletal: Negative.   Skin: Negative.   Neurological: Negative.   Endo/Heme/Allergies: Negative.   Psychiatric/Behavioral: Negative.       Physical Exam:  weight is 156  lb 6 oz (70.9 kg). Her oral temperature is 97.9 F (36.6 C). Her blood pressure is 158/76 (abnormal) and her pulse is 65. Her respiration is 20 and oxygen saturation is 100%.   Wt Readings from Last 3 Encounters:  06/03/18 156 lb 6 oz (70.9 kg)  05/06/18 155 lb 12 oz (70.6 kg)  04/08/18 153 lb 8 oz (69.6 kg)    Physical Exam Vitals signs reviewed.  HENT:     Head: Normocephalic and atraumatic.  Eyes:     Pupils: Pupils are equal, round, and reactive to light.  Neck:     Musculoskeletal: Normal range of motion.  Cardiovascular:     Rate and Rhythm: Normal rate and regular rhythm.     Heart sounds: Normal heart sounds.  Pulmonary:     Effort: Pulmonary effort is normal.     Breath sounds: Normal breath sounds.  Abdominal:     General: Bowel sounds are normal.     Palpations: Abdomen is soft.    Musculoskeletal: Normal range of motion.        General: No tenderness or deformity.  Lymphadenopathy:     Cervical: No cervical adenopathy.  Skin:    General: Skin is warm and dry.     Findings: No erythema or rash.  Neurological:     Mental Status: She is alert and oriented to person, place, and time.  Psychiatric:        Behavior: Behavior normal.        Thought Content: Thought content normal.        Judgment: Judgment normal.      Lab Results  Component Value Date   WBC 4.8 05/06/2018   HGB 9.7 (L) 05/06/2018   HCT 32.2 (L) 05/06/2018   MCV 85.9 05/06/2018   PLT 252 05/06/2018   Lab Results  Component Value Date   FERRITIN 12 12/17/2017   IRON 57 12/17/2017   TIBC 447 12/17/2017   UIBC 390 12/17/2017   IRONPCTSAT 13 12/17/2017   Lab Results  Component Value Date   RBC 3.75 (L) 05/06/2018   No results found for: KPAFRELGTCHN, LAMBDASER, KAPLAMBRATIO No results found for: IGGSERUM, IGA, IGMSERUM No results found for: Odetta Pink, SPEI   Chemistry      Component Value Date/Time   NA 140 05/06/2018 1010   NA 147 (H) 06/11/2017 1345   NA 139 04/23/2017 1256   K 3.8 05/06/2018 1010   K 4.3 06/11/2017 1345   K 4.2 04/23/2017 1256   CL 102 05/06/2018 1010   CL 106 06/11/2017 1345   CO2 30 05/06/2018 1010   CO2 27 06/11/2017 1345   CO2 28 04/23/2017 1256   BUN 22 05/06/2018 1010   BUN 13 06/11/2017 1345   BUN 10.0 04/23/2017 1256   CREATININE 1.10 05/06/2018 1010   CREATININE 1.1 06/11/2017 1345   CREATININE 0.8 04/23/2017 1256      Component Value Date/Time   CALCIUM 9.6 05/06/2018 1010   CALCIUM 8.9 06/11/2017 1345   CALCIUM 8.7 04/23/2017 1256   ALKPHOS 62 05/06/2018 1010   ALKPHOS 85 (H) 06/11/2017 1345   ALKPHOS 104 04/23/2017 1256   AST 24 05/06/2018 1010   AST 15 04/23/2017 1256   ALT 10 05/06/2018 1010   ALT 17 06/11/2017 1345   ALT <6 04/23/2017 1256   BILITOT 1.0 05/06/2018 1010    BILITOT 0.59 04/23/2017 1256       Impression and Plan: Michelle Bowen  is a very pleasant 77 yo [place] American female with metastatic breast cancer, triple positive.   Her last echocardiogram was back in July.  We really need to get another one set up for her.  We will see what the MRI shows.  I suppose it would not surprise me if there was a lesion in the liver.  Again, her CA 27.29 has been going up slowly.  We do find that there is a lesion, I seriously doubt that she would let us biopsy this.  A biopsy probably would be most helpful.  I am just not sure that Ms. Caba would be agreeable to this.  Since she is triple positive, we have a lot of options at our disposal as to how we can help.  We could switch her over to [facility].  We could switch her over to Faslodex/Ibrance.  Again, there are things that we can definitely do to try to help.  We will have her come back in early January.  Hopefully, she will be feeling better.  We will see what the scan shows.    Volanda Napoleon, MD 12/12/201911:24 AM

## 2018-06-18 ENCOUNTER — Other Ambulatory Visit: Payer: Self-pay | Admitting: *Deleted

## 2018-06-18 ENCOUNTER — Inpatient Hospital Stay: Payer: Medicare Other

## 2018-06-18 ENCOUNTER — Ambulatory Visit (HOSPITAL_BASED_OUTPATIENT_CLINIC_OR_DEPARTMENT_OTHER)
Admission: RE | Admit: 2018-06-18 | Discharge: 2018-06-18 | Disposition: A | Discharge status: Home / Self Care | Payer: Medicare Other | Source: Ambulatory Visit | Attending: Hematology & Oncology | Admitting: Hematology & Oncology

## 2018-06-18 ENCOUNTER — Other Ambulatory Visit: Payer: Self-pay

## 2018-06-18 DIAGNOSIS — C7951 Secondary malignant neoplasm of bone: Secondary | ICD-10-CM

## 2018-06-18 DIAGNOSIS — I340 Nonrheumatic mitral (valve) insufficiency: Secondary | ICD-10-CM | POA: Insufficient documentation

## 2018-06-18 DIAGNOSIS — C50411 Malignant neoplasm of upper-outer quadrant of right female breast: Secondary | ICD-10-CM

## 2018-06-18 DIAGNOSIS — I77810 Thoracic aortic ectasia: Secondary | ICD-10-CM | POA: Diagnosis not present

## 2018-06-18 DIAGNOSIS — C50911 Malignant neoplasm of unspecified site of right female breast: Secondary | ICD-10-CM | POA: Diagnosis not present

## 2018-06-18 DIAGNOSIS — Z95828 Presence of other vascular implants and grafts: Secondary | ICD-10-CM

## 2018-06-18 DIAGNOSIS — I10 Essential (primary) hypertension: Secondary | ICD-10-CM | POA: Diagnosis not present

## 2018-06-18 LAB — CBC WITH DIFFERENTIAL (CANCER CENTER ONLY)
Abs Immature Granulocytes: 0.02 10*3/uL (ref 0.00–0.07)
Basophils Absolute: 0 10*3/uL (ref 0.0–0.1)
Basophils Relative: 0 %
Eosinophils Absolute: 0.1 10*3/uL (ref 0.0–0.5)
Eosinophils Relative: 1 %
HCT: 31.3 % — ABNORMAL LOW (ref 36.0–46.0)
Hemoglobin: 9.1 g/dL — ABNORMAL LOW (ref 12.0–15.0)
Immature Granulocytes: 1 %
Lymphocytes Relative: 28 %
Lymphs Abs: 1.2 10*3/uL (ref 0.7–4.0)
MCH: 25.1 pg — ABNORMAL LOW (ref 26.0–34.0)
MCHC: 29.1 g/dL — ABNORMAL LOW (ref 30.0–36.0)
MCV: 86.2 fL (ref 80.0–100.0)
Monocytes Absolute: 0.4 10*3/uL (ref 0.1–1.0)
Monocytes Relative: 10 %
Neutro Abs: 2.5 10*3/uL (ref 1.7–7.7)
Neutrophils Relative %: 60 %
PLATELETS: 306 10*3/uL (ref 150–400)
RBC: 3.63 MIL/uL — ABNORMAL LOW (ref 3.87–5.11)
RDW: 15.9 % — ABNORMAL HIGH (ref 11.5–15.5)
WBC Count: 4.2 10*3/uL (ref 4.0–10.5)
nRBC: 0 % (ref 0.0–0.2)

## 2018-06-18 LAB — CMP (CANCER CENTER ONLY)
ALT: 7 U/L (ref 0–44)
AST: 17 U/L (ref 15–41)
Albumin: 4.1 g/dL (ref 3.5–5.0)
Alkaline Phosphatase: 67 U/L (ref 38–126)
Anion gap: 7 (ref 5–15)
BUN: 20 mg/dL (ref 8–23)
CO2: 28 mmol/L (ref 22–32)
Calcium: 9.2 mg/dL (ref 8.9–10.3)
Chloride: 104 mmol/L (ref 98–111)
Creatinine: 1.24 mg/dL — ABNORMAL HIGH (ref 0.44–1.00)
GFR, Est AFR Am: 49 mL/min — ABNORMAL LOW (ref >60)
GFR, Estimated: 42 mL/min — ABNORMAL LOW (ref >60)
Glucose, Bld: 86 mg/dL (ref 70–99)
POTASSIUM: 3.7 mmol/L (ref 3.5–5.1)
Sodium: 139 mmol/L (ref 135–145)
Total Bilirubin: 0.6 mg/dL (ref 0.3–1.2)
Total Protein: 8.1 g/dL (ref 6.5–8.1)

## 2018-06-18 MED ORDER — SODIUM CHLORIDE 0.9% FLUSH
10.0000 mL | INTRAVENOUS | Status: DC | PRN
  Administered 2018-06-18: 10 mL via INTRAVENOUS
  Filled 2018-06-18: qty 10

## 2018-06-18 MED ORDER — HEPARIN SOD (PORK) LOCK FLUSH 100 UNIT/ML IV SOLN
500.0000 [IU] | Freq: Once | INTRAVENOUS | Status: AC
  Administered 2018-06-18: 500 [IU] via INTRAVENOUS
  Filled 2018-06-18: qty 5

## 2018-06-18 NOTE — Progress Notes (Signed)
  Echocardiogram 2D Echocardiogram has been performed.  Darlina Sicilian M 06/18/2018, 11:43 AM

## 2018-06-18 NOTE — Patient Instructions (Signed)

## 2018-06-19 ENCOUNTER — Ambulatory Visit (HOSPITAL_BASED_OUTPATIENT_CLINIC_OR_DEPARTMENT_OTHER)
Admission: RE | Admit: 2018-06-19 | Discharge: 2018-06-19 | Disposition: A | Discharge status: Home / Self Care | Payer: Medicare Other | Source: Ambulatory Visit | Attending: Hematology & Oncology | Admitting: Hematology & Oncology

## 2018-06-19 DIAGNOSIS — K7689 Other specified diseases of liver: Secondary | ICD-10-CM | POA: Diagnosis not present

## 2018-06-19 DIAGNOSIS — C50411 Malignant neoplasm of upper-outer quadrant of right female breast: Secondary | ICD-10-CM | POA: Diagnosis present

## 2018-06-19 MED ORDER — GADOBUTROL 1 MMOL/ML IV SOLN
7.5000 mL | Freq: Once | INTRAVENOUS | Status: AC | PRN
  Administered 2018-06-19: 7.5 mL via INTRAVENOUS

## 2018-06-21 ENCOUNTER — Telehealth: Payer: Self-pay | Admitting: *Deleted

## 2018-06-21 NOTE — Telephone Encounter (Addendum)
Patient is aware of results  ----- Message from Volanda Napoleon, MD sent at 06/21/2018  2:28 PM EST ----- Call - the lever MRI does NOT show any cancer!!!  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

## 2018-07-14 ENCOUNTER — Emergency Department (HOSPITAL_BASED_OUTPATIENT_CLINIC_OR_DEPARTMENT_OTHER)
Admission: EM | Admit: 2018-07-14 | Discharge: 2018-07-14 | Discharge status: Against Medical Advice | Payer: Medicare Other | Attending: Emergency Medicine | Admitting: Emergency Medicine

## 2018-07-14 ENCOUNTER — Other Ambulatory Visit: Payer: Self-pay

## 2018-07-14 ENCOUNTER — Telehealth: Payer: Self-pay | Admitting: *Deleted

## 2018-07-14 ENCOUNTER — Encounter (HOSPITAL_BASED_OUTPATIENT_CLINIC_OR_DEPARTMENT_OTHER): Payer: Self-pay | Admitting: Emergency Medicine

## 2018-07-14 DIAGNOSIS — Z87891 Personal history of nicotine dependence: Secondary | ICD-10-CM | POA: Diagnosis not present

## 2018-07-14 DIAGNOSIS — Z85830 Personal history of malignant neoplasm of bone: Secondary | ICD-10-CM | POA: Diagnosis not present

## 2018-07-14 DIAGNOSIS — G510 Bell's palsy: Secondary | ICD-10-CM | POA: Diagnosis not present

## 2018-07-14 DIAGNOSIS — Z79899 Other long term (current) drug therapy: Secondary | ICD-10-CM | POA: Diagnosis not present

## 2018-07-14 DIAGNOSIS — Z853 Personal history of malignant neoplasm of breast: Secondary | ICD-10-CM | POA: Insufficient documentation

## 2018-07-14 DIAGNOSIS — R29810 Facial weakness: Secondary | ICD-10-CM | POA: Diagnosis present

## 2018-07-14 LAB — CBG MONITORING, ED: GLUCOSE-CAPILLARY: 109 mg/dL — AB (ref 70–99)

## 2018-07-14 MED ORDER — PREDNISONE 10 MG PO TABS: 60.0000 mg | ORAL_TABLET | Freq: Every day | ORAL | 0 refills | Status: AC

## 2018-07-14 NOTE — ED Notes (Signed)
Pt c/o right side facial weakness that has progressively gotten worse since waking this morning. Pt is unable to close right eye all of the way and it is watering. Pt has equal grip strength, denies weakness or numbness. She also states that her BP is always high and that Dr. Deberah Pelton is managing it.

## 2018-07-14 NOTE — ED Provider Notes (Signed)
[facility] EMERGENCY DEPARTMENT Provider Note   CSN: [id number] Arrival date & time: 07/14/18  1515     History   Chief Complaint Chief Complaint  Patient presents with  . Facial Droop    HPI Michelle Bowen is a 78 y.o. female.  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 Htn, breast Ca No hlpd, no DM   Don't have PCP, Dr. Marin Olp is oncologist  Did not take blood pressure medications today.    Past Medical History:  Diagnosis Date  . Arthritis    bil knees, left thumb  . Breast cancer ([place]) 12/06/15   Right  . Cancer, metastatic to bone ([place]) 08/2016  . Counseling regarding goals of care 06/11/2017  . Hypertension     Patient Active Problem List   Diagnosis Date Noted  . Counseling regarding goals of care 06/11/2017  . Bone metastases ([place]) 04/27/2017  . Breast cancer of upper-outer quadrant of right female breast ([place]) 12/17/2015    Past Surgical History:  Procedure Laterality Date  . BREAST LUMPECTOMY WITH AXILLARY LYMPH NODE DISSECTION Right 01/17/2016   Procedure: RIGHT BREAST LUMPECTOMY WITH AXILLARY LYMPH NODE DISSECTION;  Surgeon: [name], MD;  Location: [facility];  Service: General;  Laterality: Right;  . BREAST SURGERY Right 2001   sebaceous cyst removal  . RADIOACTIVE SEED GUIDED EXCISIONAL BREAST BIOPSY Left 01/17/2016   Procedure: RADIOACTIVE SEED GUIDED EXCISIONAL LEFT BREAST BIOPSY;  Surgeon: [name],  MD;  Location: [facility];  Service: General;  Laterality: Left;  . RE-EXCISION OF BREAST LUMPECTOMY Right 02/05/2016   Procedure: RE-EXCISION OF BREAST LUMPECTOMY AND REMOVAL OF NIPPLE;  Surgeon: [name], MD;  Location: [facility];  Service: General;  Laterality: Right;  . TONSILLECTOMY       OB History   No obstetric history on file.      Home Medications    Prior to Admission medications   Medication Sig Start Date End Date Taking? Authorizing Provider  Cholecalciferol (VITAMIN D3) 5000 units TABS Take by mouth.    [provider]  cyanocobalamin 500 MCG tablet Take 500 mcg by mouth daily.    [provider]  gabapentin (NEURONTIN) 300 MG capsule Take 1 capsule (300 mg total) by mouth 3 (three) times daily. 03/11/18   [place], Holli Humbles, NP  Lactobacillus (PROBIOTIC ACIDOPHILUS PO) Take by mouth.    [provider]  letrozole (FEMARA) 2.5 MG tablet Take 1 tablet (2.5 mg total) by mouth daily. 10/15/17   Volanda Napoleon, MD  Misc. Devices MISC For Bilateral hip pain    [provider]  oxyCODONE (OXY IR/ROXICODONE) 5 MG immediate release tablet Take 1 tablet (5 mg total) by mouth every 6 (six) hours as needed for severe pain. 05/06/18   [place], Holli Humbles, NP  predniSONE (DELTASONE) 10 MG tablet Take 6 tablets (60 mg total) by mouth daily for 7 days. 07/14/18 07/21/18  Darianna Amy,  Junie Panning, MD  triamterene-hydrochlorothiazide (DYAZIDE) 37.5-25 MG capsule Take 1 capsule by mouth daily.    [provider]    Family History Family History  Problem Relation Age of Onset  . Breast cancer Sister 30       breast  . Breast cancer Other        neice at 55  . Breast cancer Other        neice at 68    Social History Social History   Tobacco Use  . Smoking status: Former Research scientist (life sciences)  . Smokeless tobacco: Never Used  Substance Use Topics  . Alcohol use: Yes    Comment: social  . Drug use: No    Comment: smoke  1pack per week 7y, quit age 78     Allergies   Patient has no known allergies.   Review of Systems Review of Systems  Constitutional: Negative for fever.  HENT: Negative for sore throat.   Eyes: Negative for visual disturbance.  Respiratory: Negative for cough and shortness of breath.   Cardiovascular: Negative for chest pain.  Gastrointestinal: Negative for abdominal pain, nausea and vomiting.  Genitourinary: Negative for difficulty urinating.  Musculoskeletal: Negative for back pain and neck pain.  Skin: Negative for rash.  Neurological: Positive for facial asymmetry. Negative for dizziness, seizures, syncope, speech difficulty, weakness, numbness and headaches.     Physical Exam Updated Vital Signs BP (!) 189/93   Pulse 87   Temp 98.5 F (36.9 C) (Oral)   Resp 11   LMP 12/26/2015   SpO2 99%   Physical Exam Vitals signs and nursing note reviewed.  Constitutional:      General: She is not in acute distress.    Appearance: She is well-developed. She is not diaphoretic.  HENT:     Head: Normocephalic and atraumatic.  Eyes:     General: No visual field deficit.    Conjunctiva/sclera: Conjunctivae normal.  Neck:     Musculoskeletal: Normal range of motion.  Cardiovascular:     Rate and Rhythm: Normal rate and regular rhythm.     Heart sounds: Normal heart sounds. No murmur. No friction rub. No gallop.   Pulmonary:     Effort: Pulmonary effort is normal. No respiratory distress.     Breath sounds: Normal breath sounds. No wheezing or rales.  Abdominal:     General: There is no distension.     Palpations: Abdomen is soft.     Tenderness: There is no abdominal tenderness. There is no guarding.  Musculoskeletal:        General: No tenderness.  Skin:    General: Skin is warm and dry.     Findings: No erythema or rash.  Neurological:     Mental Status: She is alert and oriented to person, place, and time.     GCS: GCS eye subscore is 4. GCS verbal subscore is 5.  GCS motor subscore is 6.     Cranial Nerves: Facial asymmetry present. No dysarthria.     Sensory: Sensation is intact.     Motor: Motor function is intact.     Coordination: Finger-Nose-Finger Test normal.     Comments: Right facial droop, weakness right forehead, weakness with right eye closing      ED Treatments / Results  Labs (all labs ordered are listed, but only abnormal results are displayed) Labs Reviewed  CBG MONITORING, ED - Abnormal; Notable for the following components:      Result Value   Glucose-Capillary 109 (*)  All other components within normal limits    EKG EKG Interpretation  Date/Time:  Wednesday July 14 2018 15:30:44 EST Ventricular Rate:  85 PR Interval:    QRS Duration: 98 QT Interval:  398 QTC Calculation: 474 R Axis:   -8 Text Interpretation:  Sinus rhythm Multiple ventricular premature complexes Prolonged PR interval Probable left atrial enlargement Left ventricular hypertrophy No significant change since last tracing Confirmed by Gareth Morgan [phone]) on 07/15/2018 2:49:03 AM   Radiology No results found.  Procedures Procedures (including critical care time)  Medications Ordered in ED Medications - No data to display   Initial Impression / Assessment and Plan / ED Course  I have reviewed the triage vital signs and the nursing notes.  Pertinent labs & imaging results that were available during my care of the patient were reviewed by me and considered in my medical decision making (see chart for details).    78yo female with history of hypertension and breast cancer presents with concern for right facial droop. Glucose WNL.  She has weakness of the forehead and facial muscles most consistent with Bells Palsy, however given risk factors and cancer history discussed with patient and recommend transfer for [facility] contrast [facility].  Discussed with patient that both Neurologist on call and I recommend MRI for further evaluation  given cancer history, however patient reports she would like to leave against medical advice.  Understands the risk of death and disability but would like to leave AMA.     Final Clinical Impressions(s) / ED Diagnoses   Final diagnoses:  Mouth droop due to facial weakness  Facial nerve palsy    ED Discharge Orders         Ordered    predniSONE (DELTASONE) 10 MG tablet  Daily     07/14/18 1645           Gareth Morgan, MD 07/15/18 [id number]

## 2018-07-14 NOTE — Telephone Encounter (Signed)
Patient states she has symptoms of "Bells Palsy" and was wondering who to see about this.  Called patient back states it is just her mouth that is drooped on one side.  No other side effects.  Talked to Dr. Marin Olp and Judson Roch who recommended that she go to Primary Care.  She does not have a primary care doctor but is currently on her way to the Non Emergency area in our building to be checked. Out.  Agreed with this and let Laverna Peace NP know

## 2018-07-14 NOTE — ED Notes (Signed)
Pt verbalizes that she is leaving AMA and understands that she is assuming the risks of doing so. Pt encouraged to return to an ED should her symptoms worsen.

## 2018-07-14 NOTE — ED Triage Notes (Signed)
Pt reports woke today with facial droop, alert and oriented x 4, no slurred speech and no further neuro deficit. denies Bell's palsy in past. Hx breast cancer metastatic  to bone. Hx HTN. Denies weakness to extremities.

## 2018-08-27 ENCOUNTER — Telehealth: Payer: Self-pay | Admitting: *Deleted

## 2018-08-27 NOTE — Telephone Encounter (Signed)
Copied from [place] [id number]. Topic: Appointment Scheduling - Scheduling Inquiry for Clinic >> Aug 27, 2018 12:51 PM Scherrie Gerlach wrote: Reason for CRM: Pt states she was dx with cancer 2018. pt was seeing Dr Marin Olp for all of 2019   but does not have a pcp. Pt called wanted a female physician.  wants to know if Dr Etter Sjogren will accept her as a pt. (Declined Dr Nani Ravens or Percell Miller.) this office is close to her home.

## 2018-08-31 NOTE — Telephone Encounter (Signed)
Dr. Lowne is not accepting new patients at this time 

## 2018-08-31 NOTE — Telephone Encounter (Signed)
Called to inform patient.

## 2018-09-06 ENCOUNTER — Other Ambulatory Visit: Payer: Self-pay | Admitting: *Deleted

## 2018-09-06 DIAGNOSIS — Z170 Estrogen receptor positive status [ER+]: Principal | ICD-10-CM

## 2018-09-06 DIAGNOSIS — C50411 Malignant neoplasm of upper-outer quadrant of right female breast: Secondary | ICD-10-CM

## 2018-09-06 DIAGNOSIS — C7951 Secondary malignant neoplasm of bone: Secondary | ICD-10-CM

## 2018-09-06 MED ORDER — OXYCODONE HCL 5 MG PO TABS: 5.0000 mg | ORAL_TABLET | Freq: Four times a day (QID) | ORAL | 0 refills | Status: DC | PRN

## 2018-09-28 ENCOUNTER — Telehealth: Payer: Self-pay | Admitting: Hematology & Oncology

## 2018-09-28 NOTE — Telephone Encounter (Signed)
lmom to inform pt to 4/27 appt at 1 pm per 4/7 staff msg. Informed pt to call and resch if aapt date/time does not work.

## 2018-10-04 ENCOUNTER — Inpatient Hospital Stay (HOSPITAL_COMMUNITY): Payer: Medicare Other

## 2018-10-04 ENCOUNTER — Encounter (HOSPITAL_COMMUNITY): Payer: Self-pay

## 2018-10-04 ENCOUNTER — Other Ambulatory Visit: Payer: Self-pay | Admitting: *Deleted

## 2018-10-04 ENCOUNTER — Encounter: Payer: Self-pay | Admitting: Hematology & Oncology

## 2018-10-04 ENCOUNTER — Telehealth: Payer: Self-pay | Admitting: *Deleted

## 2018-10-04 ENCOUNTER — Inpatient Hospital Stay: Payer: Medicare Other | Attending: Hematology & Oncology | Admitting: Hematology & Oncology

## 2018-10-04 ENCOUNTER — Ambulatory Visit: Payer: Medicare Other

## 2018-10-04 ENCOUNTER — Inpatient Hospital Stay (HOSPITAL_COMMUNITY)
Admission: AD | Admit: 2018-10-04 | Discharge: 2018-10-09 | DRG: 597 | Disposition: A | Discharge status: Home / Self Care | Payer: Medicare Other | Source: Ambulatory Visit | Attending: Hematology & Oncology | Admitting: Hematology & Oncology

## 2018-10-04 ENCOUNTER — Inpatient Hospital Stay: Payer: Medicare Other

## 2018-10-04 ENCOUNTER — Other Ambulatory Visit: Payer: Self-pay

## 2018-10-04 ENCOUNTER — Other Ambulatory Visit: Payer: Self-pay | Admitting: Hematology & Oncology

## 2018-10-04 ENCOUNTER — Emergency Department (HOSPITAL_COMMUNITY)
Admission: EM | Admit: 2018-10-04 | Discharge: 2018-10-04 | Discharge status: Absent without leave | Payer: Medicare Other

## 2018-10-04 VITALS — BP 173/84 | HR 81 | Temp 98.5°F | Resp 19 | Wt 142.0 lb

## 2018-10-04 DIAGNOSIS — C7951 Secondary malignant neoplasm of bone: Secondary | ICD-10-CM

## 2018-10-04 DIAGNOSIS — G589 Mononeuropathy, unspecified: Secondary | ICD-10-CM | POA: Diagnosis present

## 2018-10-04 DIAGNOSIS — N289 Disorder of kidney and ureter, unspecified: Secondary | ICD-10-CM | POA: Diagnosis present

## 2018-10-04 DIAGNOSIS — R634 Abnormal weight loss: Secondary | ICD-10-CM | POA: Diagnosis not present

## 2018-10-04 DIAGNOSIS — E860 Dehydration: Secondary | ICD-10-CM | POA: Diagnosis present

## 2018-10-04 DIAGNOSIS — Z7189 Other specified counseling: Secondary | ICD-10-CM

## 2018-10-04 DIAGNOSIS — D649 Anemia, unspecified: Secondary | ICD-10-CM | POA: Diagnosis not present

## 2018-10-04 DIAGNOSIS — Z923 Personal history of irradiation: Secondary | ICD-10-CM | POA: Diagnosis not present

## 2018-10-04 DIAGNOSIS — Z79891 Long term (current) use of opiate analgesic: Secondary | ICD-10-CM

## 2018-10-04 DIAGNOSIS — Z170 Estrogen receptor positive status [ER+]: Secondary | ICD-10-CM

## 2018-10-04 DIAGNOSIS — H02401 Unspecified ptosis of right eyelid: Secondary | ICD-10-CM | POA: Diagnosis not present

## 2018-10-04 DIAGNOSIS — Z79899 Other long term (current) drug therapy: Secondary | ICD-10-CM

## 2018-10-04 DIAGNOSIS — Z853 Personal history of malignant neoplasm of breast: Secondary | ICD-10-CM

## 2018-10-04 DIAGNOSIS — C50919 Malignant neoplasm of unspecified site of unspecified female breast: Secondary | ICD-10-CM | POA: Diagnosis present

## 2018-10-04 DIAGNOSIS — C50411 Malignant neoplasm of upper-outer quadrant of right female breast: Secondary | ICD-10-CM

## 2018-10-04 DIAGNOSIS — R52 Pain, unspecified: Secondary | ICD-10-CM

## 2018-10-04 DIAGNOSIS — Z531 Procedure and treatment not carried out because of patient's decision for reasons of belief and group pressure: Secondary | ICD-10-CM | POA: Diagnosis not present

## 2018-10-04 DIAGNOSIS — M549 Dorsalgia, unspecified: Secondary | ICD-10-CM

## 2018-10-04 DIAGNOSIS — D509 Iron deficiency anemia, unspecified: Secondary | ICD-10-CM

## 2018-10-04 DIAGNOSIS — C7931 Secondary malignant neoplasm of brain: Secondary | ICD-10-CM | POA: Diagnosis present

## 2018-10-04 DIAGNOSIS — I10 Essential (primary) hypertension: Secondary | ICD-10-CM | POA: Diagnosis present

## 2018-10-04 DIAGNOSIS — Z803 Family history of malignant neoplasm of breast: Secondary | ICD-10-CM | POA: Diagnosis not present

## 2018-10-04 DIAGNOSIS — Z5112 Encounter for antineoplastic immunotherapy: Secondary | ICD-10-CM | POA: Insufficient documentation

## 2018-10-04 DIAGNOSIS — R05 Cough: Secondary | ICD-10-CM

## 2018-10-04 DIAGNOSIS — G936 Cerebral edema: Secondary | ICD-10-CM | POA: Diagnosis present

## 2018-10-04 DIAGNOSIS — G510 Bell's palsy: Secondary | ICD-10-CM | POA: Diagnosis present

## 2018-10-04 DIAGNOSIS — C50911 Malignant neoplasm of unspecified site of right female breast: Secondary | ICD-10-CM | POA: Insufficient documentation

## 2018-10-04 DIAGNOSIS — M1812 Unilateral primary osteoarthritis of first carpometacarpal joint, left hand: Secondary | ICD-10-CM | POA: Diagnosis not present

## 2018-10-04 DIAGNOSIS — K769 Liver disease, unspecified: Secondary | ICD-10-CM | POA: Diagnosis present

## 2018-10-04 DIAGNOSIS — Z1501 Genetic susceptibility to malignant neoplasm of breast: Secondary | ICD-10-CM | POA: Diagnosis not present

## 2018-10-04 DIAGNOSIS — Z87891 Personal history of nicotine dependence: Secondary | ICD-10-CM | POA: Diagnosis not present

## 2018-10-04 DIAGNOSIS — G893 Neoplasm related pain (acute) (chronic): Secondary | ICD-10-CM | POA: Diagnosis present

## 2018-10-04 DIAGNOSIS — D500 Iron deficiency anemia secondary to blood loss (chronic): Secondary | ICD-10-CM

## 2018-10-04 DIAGNOSIS — M4854XA Collapsed vertebra, not elsewhere classified, thoracic region, initial encounter for fracture: Secondary | ICD-10-CM | POA: Diagnosis present

## 2018-10-04 DIAGNOSIS — G8929 Other chronic pain: Secondary | ICD-10-CM | POA: Insufficient documentation

## 2018-10-04 DIAGNOSIS — R059 Cough, unspecified: Secondary | ICD-10-CM

## 2018-10-04 DIAGNOSIS — Z95828 Presence of other vascular implants and grafts: Secondary | ICD-10-CM

## 2018-10-04 DIAGNOSIS — Z9119 Patient's noncompliance with other medical treatment and regimen: Secondary | ICD-10-CM | POA: Diagnosis not present

## 2018-10-04 DIAGNOSIS — M4726 Other spondylosis with radiculopathy, lumbar region: Secondary | ICD-10-CM | POA: Diagnosis not present

## 2018-10-04 DIAGNOSIS — M170 Bilateral primary osteoarthritis of knee: Secondary | ICD-10-CM | POA: Diagnosis not present

## 2018-10-04 HISTORY — DX: Iron deficiency anemia secondary to blood loss (chronic): D50.0

## 2018-10-04 LAB — CBC WITH DIFFERENTIAL (CANCER CENTER ONLY)
Abs Immature Granulocytes: 0.01 10*3/uL (ref 0.00–0.07)
Basophils Absolute: 0 10*3/uL (ref 0.0–0.1)
Basophils Relative: 0 %
Eosinophils Absolute: 0 10*3/uL (ref 0.0–0.5)
Eosinophils Relative: 1 %
HCT: 32.5 % — ABNORMAL LOW (ref 36.0–46.0)
Hemoglobin: 9.9 g/dL — ABNORMAL LOW (ref 12.0–15.0)
Immature Granulocytes: 0 %
Lymphocytes Relative: 22 %
Lymphs Abs: 1.1 10*3/uL (ref 0.7–4.0)
MCH: 26.2 pg (ref 26.0–34.0)
MCHC: 30.5 g/dL (ref 30.0–36.0)
MCV: 86 fL (ref 80.0–100.0)
Monocytes Absolute: 0.4 10*3/uL (ref 0.1–1.0)
Monocytes Relative: 8 %
Neutro Abs: 3.4 10*3/uL (ref 1.7–7.7)
Neutrophils Relative %: 69 %
Platelet Count: 321 10*3/uL (ref 150–400)
RBC: 3.78 MIL/uL — ABNORMAL LOW (ref 3.87–5.11)
RDW: 17.3 % — ABNORMAL HIGH (ref 11.5–15.5)
WBC Count: 4.9 10*3/uL (ref 4.0–10.5)
nRBC: 0 % (ref 0.0–0.2)

## 2018-10-04 LAB — CMP (CANCER CENTER ONLY)
ALT: <5 U/L (ref 0–44)
AST: 21 U/L (ref 15–41)
Albumin: 4.2 g/dL (ref 3.5–5.0)
Alkaline Phosphatase: 102 U/L (ref 38–126)
Anion gap: 11 (ref 5–15)
BUN: 11 mg/dL (ref 8–23)
CO2: 29 mmol/L (ref 22–32)
Calcium: 10.7 mg/dL — ABNORMAL HIGH (ref 8.9–10.3)
Chloride: 99 mmol/L (ref 98–111)
Creatinine: 1.57 mg/dL — ABNORMAL HIGH (ref 0.44–1.00)
GFR, Est AFR Am: 37 mL/min — ABNORMAL LOW (ref >60)
GFR, Estimated: 32 mL/min — ABNORMAL LOW (ref >60)
Glucose, Bld: 99 mg/dL (ref 70–99)
Potassium: 3.8 mmol/L (ref 3.5–5.1)
Sodium: 139 mmol/L (ref 135–145)
Total Bilirubin: 0.7 mg/dL (ref 0.3–1.2)
Total Protein: 8 g/dL (ref 6.5–8.1)

## 2018-10-04 LAB — URINALYSIS, COMPLETE (UACMP) WITH MICROSCOPIC
Bilirubin Urine: NEGATIVE
Glucose, UA: NEGATIVE mg/dL
Hgb urine dipstick: NEGATIVE
Ketones, ur: 5 mg/dL — AB
Leukocytes,Ua: NEGATIVE
Nitrite: NEGATIVE
Protein, ur: NEGATIVE mg/dL
Specific Gravity, Urine: 1.009 (ref 1.005–1.030)
pH: 6 (ref 5.0–8.0)

## 2018-10-04 LAB — LACTATE DEHYDROGENASE: LDH: 391 U/L — ABNORMAL HIGH (ref 98–192)

## 2018-10-04 MED ORDER — ADULT MULTIVITAMIN W/MINERALS CH
1.0000 | ORAL_TABLET | Freq: Every day | ORAL | Status: DC
  Administered 2018-10-04 – 2018-10-08 (×5): 1 via ORAL
  Filled 2018-10-04 (×5): qty 1

## 2018-10-04 MED ORDER — DEXAMETHASONE SODIUM PHOSPHATE 10 MG/ML IJ SOLN
20.0000 mg | INTRAMUSCULAR | Status: DC
  Administered 2018-10-04 – 2018-10-06 (×3): 20 mg via INTRAVENOUS
  Filled 2018-10-04 (×4): qty 2

## 2018-10-04 MED ORDER — OXYCODONE HCL 5 MG PO TABS: 5.0000 mg | ORAL_TABLET | Freq: Four times a day (QID) | ORAL | Status: DC | PRN

## 2018-10-04 MED ORDER — HYDROMORPHONE HCL 1 MG/ML IJ SOLN
2.0000 mg | INTRAMUSCULAR | Status: DC | PRN
  Administered 2018-10-04 – 2018-10-06 (×5): 2 mg via INTRAVENOUS
  Filled 2018-10-04 (×5): qty 2

## 2018-10-04 MED ORDER — SODIUM CHLORIDE 0.9 % IV SOLN
20.0000 mg | Freq: Once | INTRAVENOUS | Status: AC
  Administered 2018-10-04: 20 mg via INTRAVENOUS
  Filled 2018-10-04: qty 2

## 2018-10-04 MED ORDER — LETROZOLE 2.5 MG PO TABS
2.5000 mg | ORAL_TABLET | Freq: Every day | ORAL | Status: DC
  Administered 2018-10-04 – 2018-10-09 (×6): 2.5 mg via ORAL
  Filled 2018-10-04 (×6): qty 1

## 2018-10-04 MED ORDER — HEPARIN SOD (PORK) LOCK FLUSH 100 UNIT/ML IV SOLN
500.0000 [IU] | Freq: Once | INTRAVENOUS | Status: AC
  Administered 2018-10-04: 500 [IU] via INTRAVENOUS
  Filled 2018-10-04: qty 5

## 2018-10-04 MED ORDER — SODIUM CHLORIDE 0.9% FLUSH
10.0000 mL | Freq: Once | INTRAVENOUS | Status: AC
  Administered 2018-10-04: 10 mL via INTRAVENOUS
  Filled 2018-10-04: qty 10

## 2018-10-04 MED ORDER — RISAQUAD PO CAPS
1.0000 | ORAL_CAPSULE | Freq: Every day | ORAL | Status: DC
  Administered 2018-10-05 – 2018-10-09 (×5): 1 via ORAL
  Filled 2018-10-04 (×5): qty 1

## 2018-10-04 MED ORDER — SODIUM CHLORIDE 0.9 % IV SOLN
INTRAVENOUS | Status: DC
  Administered 2018-10-04: 14:00:00 via INTRAVENOUS
  Filled 2018-10-04 (×2): qty 250

## 2018-10-04 MED ORDER — HYDROMORPHONE HCL 4 MG/ML IJ SOLN
INTRAMUSCULAR | Status: AC
  Filled 2018-10-04: qty 1

## 2018-10-04 MED ORDER — FAMOTIDINE 20 MG PO TABS
40.0000 mg | ORAL_TABLET | Freq: Two times a day (BID) | ORAL | Status: DC
  Administered 2018-10-05 – 2018-10-09 (×9): 40 mg via ORAL
  Filled 2018-10-04 (×10): qty 2

## 2018-10-04 MED ORDER — POLYETHYLENE GLYCOL 3350 17 G PO PACK
17.0000 g | PACK | Freq: Every day | ORAL | Status: DC
  Administered 2018-10-04 – 2018-10-08 (×4): 17 g via ORAL
  Filled 2018-10-04 (×6): qty 1

## 2018-10-04 MED ORDER — SODIUM CHLORIDE 0.9 % IV SOLN
INTRAVENOUS | Status: DC
  Administered 2018-10-04 – 2018-10-07 (×4): via INTRAVENOUS

## 2018-10-04 MED ORDER — GABAPENTIN 300 MG PO CAPS
300.0000 mg | ORAL_CAPSULE | Freq: Three times a day (TID) | ORAL | Status: DC
  Administered 2018-10-04 – 2018-10-09 (×15): 300 mg via ORAL
  Filled 2018-10-04 (×16): qty 1

## 2018-10-04 MED ORDER — ENOXAPARIN SODIUM 30 MG/0.3ML ~~LOC~~ SOLN
30.0000 mg | SUBCUTANEOUS | Status: DC
  Filled 2018-10-04 (×2): qty 0.3

## 2018-10-04 MED ORDER — TEMAZEPAM 7.5 MG PO CAPS
7.5000 mg | ORAL_CAPSULE | Freq: Every evening | ORAL | Status: DC | PRN
  Filled 2018-10-04: qty 1

## 2018-10-04 MED ORDER — OXYCODONE HCL 5 MG PO TABS
10.0000 mg | ORAL_TABLET | ORAL | Status: DC | PRN
  Administered 2018-10-05 – 2018-10-09 (×12): 10 mg via ORAL
  Filled 2018-10-04 (×11): qty 2

## 2018-10-04 MED ORDER — HYDROMORPHONE HCL 1 MG/ML IJ SOLN
2.0000 mg | Freq: Once | INTRAMUSCULAR | Status: AC
  Administered 2018-10-04: 2 mg via INTRAVENOUS

## 2018-10-04 NOTE — Telephone Encounter (Signed)
Patient placement notified of need for bed for pain control per order of Dr. Marin Olp.  Pt to go to the ER at [facility] to register and will be directly admitted to [facility] per [facility].  Patient aware.  This RN spoke with Aldean Baker regarding patient's needs and Dr. Antonieta Pert note from today.

## 2018-10-04 NOTE — Telephone Encounter (Signed)
Call placed to [facility] to notify them that patient's friend will transport her to [facility] for admission.

## 2018-10-04 NOTE — Progress Notes (Signed)
Hematology and Oncology Follow Up Visit  Michelle Bowen [id number] 10/01/1940 78 y.o. 10/04/2018   Principle Diagnosis:  Metastatic breast cancer - RIGHT - bone mets - TRIPLE POSITIVE  Current Therapy:   Herceptin 360 mg IV q 4 week - s/p cycle 15 Femara 2.5 mg po q day Xgeva 120 mg IM q 3 months - due again inDecember2019   Interim History:  Michelle Bowen is here today for a long awaited follow-up.  I still am not sure as to what happened with the scheduling.  She has not been here since December.  She has not gotten Herceptin since December.  Apparently, things started to happen to her a couple months ago.  She developed a Bell's palsy over on the right side of her face.  She went to the emergency room.  They gave her some steroids.  This is not gotten any better.  About a month ago, she began to have a lot of pain in her back.  She has had a spinal augmentation from metastatic disease.  This was quite a while back.  Back in December, we did a MRI of her liver.  This was done because there was some question as to whether or not there was a tumor there.  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  We also did a bone scan on her.  The bone scan seemed to show that there was an area of possible metastasis in the left seventh rib.  Also noted was a uptake in the left calvarium.  There was uptake in the upper lumbar lower thoracic spine related to surgery and hardware.  Again, she has not been treated for 4 months.  She is having a lot of pain.  She can barely walk in.  She clearly is going to have to be admitted.  She has been taking the oxycodone which really has not helped.  She lives by herself.  There is absolutely no way she can go home by herself in this condition.  Of note, her last CA 27.29 was going up.  It was 35 in November.  Her appetite has been decent.  She has had no problems swallowing.  Overall, her performance status is ECOG 3.    Medications:  Allergies as of 10/04/2018   No Known Allergies     Medication List       Accurate as of October 04, 2018  1:48 PM. Always use your most recent med list.        gabapentin 300 MG capsule Commonly known as:  NEURONTIN Take 1 capsule (300 mg total) by mouth 3 (three) times daily.   letrozole 2.5 MG tablet Commonly known as:  FEMARA Take 1 tablet (2.5 mg total) by mouth daily.   Misc. Devices Misc For Bilateral hip pain   oxyCODONE 5 MG immediate release tablet Commonly known as:  Oxy IR/ROXICODONE Take 1 tablet (5 mg total) by mouth every 6 (six) hours as needed for severe pain.   PROBIOTIC ACIDOPHILUS PO Take by mouth.   triamterene-hydrochlorothiazide 37.5-25 MG capsule Commonly known as:  DYAZIDE Take 1 capsule by mouth daily.   vitamin B-12 500 MCG tablet Commonly known as:  CYANOCOBALAMIN Take 500 mcg by mouth daily.   Vitamin D3 125 MCG (5000 UT) Tabs Take by mouth.       Allergies: No Known Allergies  Past Medical History, Surgical history, Social history, and Family History were reviewed and updated.  Review of Systems: Review of Systems  Constitutional: Positive for malaise/fatigue and weight loss.  HENT: Negative.   Eyes: Positive for photophobia and redness.  Respiratory: Negative.   Cardiovascular: Negative.   Gastrointestinal: Positive for abdominal pain.  Genitourinary: Negative.   Musculoskeletal: Positive for back pain.  Skin: Negative.   Neurological: Positive for weakness.  Endo/Heme/Allergies: Negative.   Psychiatric/Behavioral: Negative.       Physical Exam:  weight is 142 lb (64.4 kg). Her oral temperature is 98.5 F (36.9 C). Her blood pressure is 173/84 (abnormal) and her pulse is 81. Her respiration is 19 and oxygen saturation is 100%.   Wt Readings from Last 3 Encounters:  10/04/18 142 lb (64.4 kg)  06/03/18 156 lb 6 oz (70.9 kg)  05/06/18 155 lb 12 oz (70.6 kg)    Physical Exam Vitals signs reviewed.   Constitutional:      Comments: Chronically ill-appearing African-American female.  She is in obvious distress with pain.  HENT:     Head: Normocephalic and atraumatic.  Eyes:     Pupils: Pupils are equal, round, and reactive to light.     Comments: Ocular exam shows ptosis of the right eye.  She has tearing of the right eye.  Left eye has good extraocular muscle movement.  Neck:     Musculoskeletal: Normal range of motion.  Cardiovascular:     Rate and Rhythm: Normal rate and regular rhythm.     Heart sounds: Normal heart sounds.  Pulmonary:     Effort: Pulmonary effort is normal.     Breath sounds: Normal breath sounds.  Abdominal:     General: Bowel sounds are normal.     Palpations: Abdomen is soft.     Comments: Abdominal exam shows a soft abdomen.  There is no fluid wave.  There is no palpable liver or spleen tip.  She has no guarding or rebound tenderness.  Musculoskeletal: Normal range of motion.        General: No tenderness or deformity.     Comments: Back exam shows some tenderness to palpation in the thoracic and lumbar spine.  I cannot detect any obvious paravertebral muscle spasm.  She does some tenderness to palpation in the left rib cage.  Lymphadenopathy:     Cervical: No cervical adenopathy.  Skin:    General: Skin is warm and dry.     Findings: No erythema or rash.  Neurological:     Mental Status: She is alert and oriented to person, place, and time.  Psychiatric:        Behavior: Behavior normal.        Thought Content: Thought content normal.        Judgment: Judgment normal.      Lab Results  Component Value Date   WBC 4.9 10/04/2018   HGB 9.9 (L) 10/04/2018   HCT 32.5 (L) 10/04/2018   MCV 86.0 10/04/2018   PLT 321 10/04/2018   Lab Results  Component Value Date   FERRITIN 12 12/17/2017   IRON 57 12/17/2017   TIBC 447 12/17/2017   UIBC 390 12/17/2017   IRONPCTSAT 13 12/17/2017   Lab Results  Component Value Date   RBC 3.78 (L) 10/04/2018    No results found for: KPAFRELGTCHN, LAMBDASER, KAPLAMBRATIO No results found for: IGGSERUM, IGA, IGMSERUM No results found for: Odetta Pink, SPEI   Chemistry      Component Value Date/Time   NA 139 10/04/2018 1208  NA 147 (H) 06/11/2017 1345   NA 139 04/23/2017 1256   K 3.8 10/04/2018 1208   K 4.3 06/11/2017 1345   K 4.2 04/23/2017 1256   CL 99 10/04/2018 1208   CL 106 06/11/2017 1345   CO2 29 10/04/2018 1208   CO2 27 06/11/2017 1345   CO2 28 04/23/2017 1256   BUN 11 10/04/2018 1208   BUN 13 06/11/2017 1345   BUN 10.0 04/23/2017 1256   CREATININE 1.57 (H) 10/04/2018 1208   CREATININE 1.1 06/11/2017 1345   CREATININE 0.8 04/23/2017 1256      Component Value Date/Time   CALCIUM 10.7 (H) 10/04/2018 1208   CALCIUM 8.9 06/11/2017 1345   CALCIUM 8.7 04/23/2017 1256   ALKPHOS 102 10/04/2018 1208   ALKPHOS 85 (H) 06/11/2017 1345   ALKPHOS 104 04/23/2017 1256   AST 21 10/04/2018 1208   AST 15 04/23/2017 1256   ALT <5 10/04/2018 1208   ALT 17 06/11/2017 1345   ALT <6 04/23/2017 1256   BILITOT 0.7 10/04/2018 1208   BILITOT 0.59 04/23/2017 1256       Impression and Plan: Ms. Danko is a very pleasant 78 yo African American female with metastatic breast cancer, triple positive.   Again, I have to really worry that she has clearly progressive disease since she has not been treated in 4 months.  I just wish she would have called [place] earlier to let us know that she had not been scheduled.  She is worried about this coronavirus and that she did not need to be seen.  It would not surprise me that we have obvious metastatic disease causing her problems.  It would not surprise me if she has pathological fractures in her back.  I hope that this Bell's palsy is not secondary to CNS metastasis.  Again, she is going had to be admitted for pain control.  She needs IV fluids.  She is IV pain medication right now.  I just hate the fact that  she is in this situation.  It really troubles me that she is declined.  Again, I have to believe that this is secondary to tumor progression.  We will get her admitted.  We will try to get her pain under better control.  We will have to see about the Bell's palsy.  I spent about an hour with her in the office today.     Volanda Napoleon, MD 4/13/20201:48 PM

## 2018-10-04 NOTE — Telephone Encounter (Signed)
Call received from patient stating that she needs to see Dr. Marin Olp today d/t "increased pain to abdomen, back, legs and difficulty walking."  Dr. Marin Olp notified and order received for patient to come in now for labs and to see Dr. Marin Olp. Patient notified of MD orders and states that she will call for a ride to come in to office now.

## 2018-10-04 NOTE — Patient Instructions (Signed)
Dehydration, Adult  Dehydration is when there is not enough fluid or water in your body. This happens when you lose more fluids than you take in. Dehydration can range from mild to very bad. It should be treated right away to keep it from getting very bad. Symptoms of mild dehydration may include:  Thirst.  Dry lips.  Slightly dry mouth.  Dry, warm skin.  Dizziness. Symptoms of moderate dehydration may include:  Very dry mouth.  Muscle cramps.  Dark pee (urine). Pee may be the color of tea.  Your body making less pee.  Your eyes making fewer tears.  Heartbeat that is uneven or faster than normal (palpitations).  Headache.  Light-headedness, especially when you stand up from sitting.  Fainting (syncope). Symptoms of very bad dehydration may include:  Changes in skin, such as: ? Cold and clammy skin. ? Blotchy (mottled) or pale skin. ? Skin that does not quickly return to normal after being lightly pinched and let go (poor skin turgor).  Changes in body fluids, such as: ? Feeling very thirsty. ? Your eyes making fewer tears. ? Not sweating when body temperature is high, such as in hot weather. ? Your body making very little pee.  Changes in vital signs, such as: ? Weak pulse. ? Pulse that is more than 100 beats a minute when you are sitting still. ? Fast breathing. ? Low blood pressure.  Other changes, such as: ? Sunken eyes. ? Cold hands and feet. ? Confusion. ? Lack of energy (lethargy). ? Trouble waking up from sleep. ? Short-term weight loss. ? Unconsciousness. Follow these instructions at home:   If told by your doctor, drink an ORS: ? Make an ORS by using instructions on the package. ? Start by drinking small amounts, about  cup (120 mL) every 5-10 minutes. ? Slowly drink more until you have had the amount that your doctor said to have.  Drink enough clear fluid to keep your pee clear or pale yellow. If you were told to drink an ORS, finish the  ORS first, then start slowly drinking clear fluids. Drink fluids such as: ? Water. Do not drink only water by itself. Doing that can make the salt (sodium) level in your body get too low (hyponatremia). ? Ice chips. ? Fruit juice that you have added water to (diluted). ? Low-calorie sports drinks.  Avoid: ? Alcohol. ? Drinks that have a lot of sugar. These include high-calorie sports drinks, fruit juice that does not have water added, and soda. ? Caffeine. ? Foods that are greasy or have a lot of fat or sugar.  Take over-the-counter and prescription medicines only as told by your doctor.  Do not take salt tablets. Doing that can make the salt level in your body get too high (hypernatremia).  Eat foods that have minerals (electrolytes). Examples include bananas, oranges, potatoes, tomatoes, and spinach.  Keep all follow-up visits as told by your doctor. This is important. Contact a doctor if:  You have belly (abdominal) pain that: ? Gets worse. ? Stays in one area (localizes).  You have a rash.  You have a stiff neck.  You get angry or annoyed more easily than normal (irritability).  You are more sleepy than normal.  You have a harder time waking up than normal.  You feel: ? Weak. ? Dizzy. ? Very thirsty.  You have peed (urinated) only a small amount of very dark pee during 6-8 hours. Get help right away if:  You have   symptoms of very bad dehydration.  You cannot drink fluids without throwing up (vomiting).  Your symptoms get worse with treatment.  You have a fever.  You have a very bad headache.  You are throwing up or having watery poop (diarrhea) and it: ? Gets worse. ? Does not go away.  You have blood or something green (bile) in your throw-up.  You have blood in your poop (stool). This may cause poop to look black and tarry.  You have not peed in 6-8 hours.  You pass out (faint).  Your heart rate when you are sitting still is more than 100 beats a  minute.  You have trouble breathing. This information is not intended to replace advice given to you by your health care provider. Make sure you discuss any questions you have with your health care provider. Document Released: 04/05/2009 Document Revised: 12/28/2015 Document Reviewed: 08/03/2015 Elsevier Interactive Patient Education  2019 Elsevier Inc.  

## 2018-10-04 NOTE — Progress Notes (Signed)
Per Dr. Marin Olp, she needs to be admitted to [facility] [place] for pain control. Per Francoise Schaumann, she is to go to [facility]  and check in to be admitted by a hospitalist. Patient's friend is taking her from [facility] to [place]. Directions given to her also. Patient taken downstairs via wheelchair to meet friend. At that time, I reviewed directions again. Both verbalized understanding.

## 2018-10-04 NOTE — Telephone Encounter (Signed)
[facility] notified about pt.'s transfer to [facility] for admission.

## 2018-10-05 ENCOUNTER — Inpatient Hospital Stay (HOSPITAL_COMMUNITY): Payer: Medicare Other

## 2018-10-05 ENCOUNTER — Ambulatory Visit
Admission: RE | Admit: 2018-10-05 | Discharge: 2018-10-05 | Disposition: A | Discharge status: Home / Self Care | Payer: Medicare Other | Source: Ambulatory Visit | Attending: Radiation Oncology | Admitting: Radiation Oncology

## 2018-10-05 DIAGNOSIS — M4854XA Collapsed vertebra, not elsewhere classified, thoracic region, initial encounter for fracture: Secondary | ICD-10-CM

## 2018-10-05 DIAGNOSIS — C50919 Malignant neoplasm of unspecified site of unspecified female breast: Secondary | ICD-10-CM

## 2018-10-05 DIAGNOSIS — C7951 Secondary malignant neoplasm of bone: Secondary | ICD-10-CM

## 2018-10-05 DIAGNOSIS — Z170 Estrogen receptor positive status [ER+]: Secondary | ICD-10-CM

## 2018-10-05 DIAGNOSIS — R634 Abnormal weight loss: Secondary | ICD-10-CM

## 2018-10-05 DIAGNOSIS — G510 Bell's palsy: Secondary | ICD-10-CM

## 2018-10-05 DIAGNOSIS — C50911 Malignant neoplasm of unspecified site of right female breast: Secondary | ICD-10-CM

## 2018-10-05 DIAGNOSIS — Z87891 Personal history of nicotine dependence: Secondary | ICD-10-CM

## 2018-10-05 DIAGNOSIS — C50411 Malignant neoplasm of upper-outer quadrant of right female breast: Secondary | ICD-10-CM

## 2018-10-05 DIAGNOSIS — E860 Dehydration: Secondary | ICD-10-CM

## 2018-10-05 DIAGNOSIS — M170 Bilateral primary osteoarthritis of knee: Secondary | ICD-10-CM

## 2018-10-05 DIAGNOSIS — G893 Neoplasm related pain (acute) (chronic): Secondary | ICD-10-CM

## 2018-10-05 DIAGNOSIS — Z923 Personal history of irradiation: Secondary | ICD-10-CM

## 2018-10-05 DIAGNOSIS — M4726 Other spondylosis with radiculopathy, lumbar region: Secondary | ICD-10-CM

## 2018-10-05 DIAGNOSIS — Z510 Encounter for antineoplastic radiation therapy: Secondary | ICD-10-CM | POA: Insufficient documentation

## 2018-10-05 DIAGNOSIS — Z803 Family history of malignant neoplasm of breast: Secondary | ICD-10-CM

## 2018-10-05 DIAGNOSIS — M1812 Unilateral primary osteoarthritis of first carpometacarpal joint, left hand: Secondary | ICD-10-CM

## 2018-10-05 DIAGNOSIS — Z9119 Patient's noncompliance with other medical treatment and regimen: Secondary | ICD-10-CM

## 2018-10-05 LAB — COMPREHENSIVE METABOLIC PANEL
ALT: 9 U/L (ref 0–44)
AST: 26 U/L (ref 15–41)
Albumin: 3.7 g/dL (ref 3.5–5.0)
Alkaline Phosphatase: 101 U/L (ref 38–126)
Anion gap: 10 (ref 5–15)
BUN: 18 mg/dL (ref 8–23)
CO2: 24 mmol/L (ref 22–32)
Calcium: 9.6 mg/dL (ref 8.9–10.3)
Chloride: 104 mmol/L (ref 98–111)
Creatinine, Ser: 1.28 mg/dL — ABNORMAL HIGH (ref 0.44–1.00)
GFR calc Af Amer: 47 mL/min — ABNORMAL LOW (ref >60)
GFR calc non Af Amer: 41 mL/min — ABNORMAL LOW (ref >60)
Glucose, Bld: 133 mg/dL — ABNORMAL HIGH (ref 70–99)
Potassium: 4.2 mmol/L (ref 3.5–5.1)
Sodium: 138 mmol/L (ref 135–145)
Total Bilirubin: 0.8 mg/dL (ref 0.3–1.2)
Total Protein: 8.2 g/dL — ABNORMAL HIGH (ref 6.5–8.1)

## 2018-10-05 LAB — CBC
HCT: 32.8 % — ABNORMAL LOW (ref 36.0–46.0)
Hemoglobin: 9.6 g/dL — ABNORMAL LOW (ref 12.0–15.0)
MCH: 26.2 pg (ref 26.0–34.0)
MCHC: 29.3 g/dL — ABNORMAL LOW (ref 30.0–36.0)
MCV: 89.4 fL (ref 80.0–100.0)
Platelets: 331 10*3/uL (ref 150–400)
RBC: 3.67 MIL/uL — ABNORMAL LOW (ref 3.87–5.11)
RDW: 17.6 % — ABNORMAL HIGH (ref 11.5–15.5)
WBC: 7.7 10*3/uL (ref 4.0–10.5)
nRBC: 0 % (ref 0.0–0.2)

## 2018-10-05 LAB — CANCER ANTIGEN 27.29: CA 27.29: 155.8 U/mL — ABNORMAL HIGH (ref 0.0–38.6)

## 2018-10-05 MED ORDER — IRBESARTAN 75 MG PO TABS
75.0000 mg | ORAL_TABLET | Freq: Every day | ORAL | Status: DC
  Administered 2018-10-05 – 2018-10-09 (×5): 75 mg via ORAL
  Filled 2018-10-05 (×5): qty 1

## 2018-10-05 MED ORDER — SODIUM CHLORIDE 0.9% FLUSH
10.0000 mL | Freq: Two times a day (BID) | INTRAVENOUS | Status: DC
  Administered 2018-10-05 – 2018-10-08 (×3): 10 mL

## 2018-10-05 MED ORDER — GADOBENATE DIMEGLUMINE 529 MG/ML IV SOLN: 6.5000 mL | Freq: Once | INTRAVENOUS | Status: DC | PRN

## 2018-10-05 MED ORDER — GADOBUTROL 1 MMOL/ML IV SOLN
6.5000 mL | Freq: Once | INTRAVENOUS | Status: AC | PRN
  Administered 2018-10-05: 6.5 mL via INTRAVENOUS

## 2018-10-05 MED ORDER — ZOLEDRONIC ACID 4 MG/5ML IV CONC
3.0000 mg | Freq: Once | INTRAVENOUS | Status: AC
  Administered 2018-10-05: 3 mg via INTRAVENOUS
  Filled 2018-10-05: qty 3.75

## 2018-10-05 MED ORDER — SODIUM CHLORIDE 0.9% FLUSH
10.0000 mL | INTRAVENOUS | Status: DC | PRN
  Administered 2018-10-05 – 2018-10-07 (×2): 10 mL
  Filled 2018-10-05 (×2): qty 40

## 2018-10-05 NOTE — H&P (Signed)
Referral MD  Reason for Referral: Metastatic breast cancer, severe pain.  No chief complaint on file. : I have been having worsening pain.  HPI: Ms. Manfredi is a very nice 78 year old postmenopausal African-American female.  She has metastatic breast cancer.  Her tumor is triple positive.  She has been getting Herceptin at the office.  For some reason, she has had no Herceptin for 4 months.  She never made any follow-up appointments to our office after December.  She is also on letrozole.  She is not taking the letrozole for couple of months.  She finally came to the office yesterday and was in severe pain.  She had lower back pain.  She has had a past history of of an L1 corpectomy.  She is walking but very slowly.  She is not eating well.  She is on oxycodone but this is not been helping her.  She appears to be dehydrated.  She probably has some hypercalcemia.  Her CA 27.29 went up to 155.  She has had no bowel or bladder incontinence.  She has had no cough..  She has is right Bell's palsy.  She has had this for 2 months she says.  It is not improved.  She clearly needs to be admitted.  She lives by herself.  She just cannot go home and fend for herself.  She got some IV fluids in the office.  We gave her some IV Decadron and some IV Dilaudid which seemed to help with her discomfort.  She had a CT of the lumbar spine.  This showed some severe arthritic issues at the L1 site.  There did not appear to be any obvious cancer.  She will need an MRI.  Also noted was a compression fracture at T4.  She has multiple rib lesions consistent with her metastatic disease.  She has had no fever.  There is been no bleeding.  She has been admitted for management of her issues so that she will be able to function at home.   Past Medical History:  Diagnosis Date  . Arthritis    bil knees, left thumb  . Breast cancer ([place]) 12/06/15   Right  . Cancer, metastatic to bone ([place]) 08/2016  . Counseling  regarding goals of care 06/11/2017  . Hypertension   :  Past Surgical History:  Procedure Laterality Date  . BREAST LUMPECTOMY WITH AXILLARY LYMPH NODE DISSECTION Right 01/17/2016   Procedure: RIGHT BREAST LUMPECTOMY WITH AXILLARY LYMPH NODE DISSECTION;  Surgeon: [name], MD;  Location: [place];  Service: General;  Laterality: Right;  . BREAST SURGERY Right 2001   sebaceous cyst removal  . RADIOACTIVE SEED GUIDED EXCISIONAL BREAST BIOPSY Left 01/17/2016   Procedure: RADIOACTIVE SEED GUIDED EXCISIONAL LEFT BREAST BIOPSY;  Surgeon: [name], MD;  Location: [place];  Service: General;  Laterality: Left;  . RE-EXCISION OF BREAST LUMPECTOMY Right 02/05/2016   Procedure: RE-EXCISION OF BREAST LUMPECTOMY AND REMOVAL OF NIPPLE;  Surgeon: [name], MD;  Location: [place];  Service: General;  Laterality: Right;  . TONSILLECTOMY    :   Current Facility-Administered Medications:  .  0.9 %  sodium chloride infusion, , Intravenous, Continuous, Vianca Bracher, Rudell Cobb, MD, Last Rate: 75 mL/hr at 10/05/18 0543 .  acidophilus (RISAQUAD) capsule 1 capsule, 1 capsule, Oral, Daily, Wendolyn Raso, Rudell Cobb, MD .  dexamethasone (DECADRON) injection 20 mg, 20 mg, Intravenous, Q24H, Wendelin Reader, Rudell Cobb, MD, 20 mg at 10/04/18 2012 .  enoxaparin (LOVENOX) injection 30 mg, 30 mg, Subcutaneous, Q24H, Sila Sarsfield, Rudell Cobb, MD .  famotidine (PEPCID) tablet 40 mg, 40 mg, Oral, BID, Klye Besecker, Rudell Cobb, MD .  gabapentin (NEURONTIN) capsule 300 mg, 300 mg, Oral, TID, Volanda Napoleon, MD, 300 mg at 10/04/18 2117 .  HYDROmorphone (DILAUDID) injection 2 mg, 2 mg, Intravenous, Q4H PRN, Volanda Napoleon, MD, 2 mg at 10/05/18 0432 .  letrozole [facility]) tablet 2.5 mg, 2.5 mg, Oral, Daily, Clarie Camey, Rudell Cobb, MD, 2.5 mg at 10/04/18 2013 .  multivitamin with minerals tablet 1 tablet, 1 tablet, Oral, Daily, Volanda Napoleon, MD, 1 tablet at 10/04/18 1838 .  oxyCODONE (Oxy IR/ROXICODONE)  immediate release tablet 10 mg, 10 mg, Oral, Q4H PRN, Volanda Napoleon, MD, 10 mg at 10/05/18 0113 .  polyethylene glycol (MIRALAX / GLYCOLAX) packet 17 g, 17 g, Oral, Daily, Volanda Napoleon, MD, 17 g at 10/04/18 1838 .  temazepam (RESTORIL) capsule 7.5 mg, 7.5 mg, Oral, QHS PRN, Volanda Napoleon, MD .  zolendronic acid (ZOMETA) 4 mg in sodium chloride 0.9 % 100 mL IVPB, 4 mg, Intravenous, Once, Mayana Irigoyen, Rudell Cobb, MD:  . acidophilus  1 capsule Oral Daily  . dexamethasone  20 mg Intravenous Q24H  . enoxaparin (LOVENOX) injection  30 mg Subcutaneous Q24H  . famotidine  40 mg Oral BID  . gabapentin  300 mg Oral TID  . letrozole  2.5 mg Oral Daily  . multivitamin with minerals  1 tablet Oral Daily  . polyethylene glycol  17 g Oral Daily  :  No Known Allergies:  Family History  Problem Relation Age of Onset  . Breast cancer Sister 30       breast  . Breast cancer Other        neice at 32  . Breast cancer Other        neice at 24  :  Social History   Socioeconomic History  . Marital status: Single    Spouse name: Not on file  . Number of children: Not on file  . Years of education: Not on file  . Highest education level: Not on file  Occupational History  . Not on file  Social Needs  . Financial resource strain: Not on file  . Food insecurity:    Worry: Not on file    Inability: Not on file  . Transportation needs:    Medical: Not on file    Non-medical: Not on file  Tobacco Use  . Smoking status: Former Research scientist (life sciences)  . Smokeless tobacco: Never Used  Substance and Sexual Activity  . Alcohol use: Yes    Comment: social  . Drug use: No    Comment: smoke 1pack per week 7y, quit age 68  . Sexual activity: Never  Lifestyle  . Physical activity:    Days per week: Not on file    Minutes per session: Not on file  . Stress: Not on file  Relationships  . Social connections:    Talks on phone: Not on file    Gets together: Not on file    Attends religious service: Not on file     Active member of club or organization: Not on file    Attends meetings of clubs or organizations: Not on file    Relationship status: Not on file  . Intimate partner violence:    Fear of current or ex partner: Not on file    Emotionally abused: Not on file  Physically abused: Not on file    Forced sexual activity: Not on file  Other Topics Concern  . Not on file  Social History Narrative  . Not on file  :  Review of Systems  Constitutional: Positive for malaise/fatigue and weight loss.  HENT: Negative.   Eyes: Positive for discharge and redness.  Respiratory: Negative.   Cardiovascular: Negative.   Gastrointestinal: Positive for abdominal pain.  Genitourinary: Negative.   Musculoskeletal: Positive for back pain.  Skin: Negative.   Neurological: Positive for weakness.  Psychiatric/Behavioral: Negative.      Exam: Chronically ill-appearing African-American female in no obvious distress.  Her vital signs show temperature of 98.5.  Pulse is 78.  Blood pressure 152/89.  Head neck exam shows the right cranial nerve deficit.  She has some slight erythema of the right eye.  There is no oral lesions.  She has no adenopathy in the neck.  Lungs are clear bilaterally.  Abdomen is soft.  She has decent bowel sounds.  There is no fluid wave.  There is some tenderness in the left upper quadrant.  There is no palpable hepatomegaly.  Back exam shows some tenderness over the thoracic and lumbar spine.  She has some tenderness over the left lateral rib cage.  Extremities shows no clubbing, cyanosis or edema.  She has some slight symmetric strength decrease in her legs.  Neurological exam is non-focal.  Skin exam is slightly dry.  Patient Vitals for the past 24 hrs:  BP Temp Temp src Pulse Resp SpO2 Height Weight  10/05/18 0500 (!) 160/66 - - 64 - - - -  10/05/18 0445 (!) 192/98 98.1 F (36.7 C) Oral (!) 57 16 92 % - -  10/04/18 2032 (!) 164/85 98.5 F (36.9 C) Oral 78 16 99 % - -  10/04/18  1646 (!) 152/89 98.8 F (37.1 C) Oral 82 18 99 % 5\' 3"  (1.6 m) 142 lb 8 oz (64.6 kg)     Recent Labs    10/04/18 1208  WBC 4.9  HGB 9.9*  HCT 32.5*  PLT 321   Recent Labs    10/04/18 1208  NA 139  K 3.8  CL 99  CO2 29  GLUCOSE 99  BUN 11  CREATININE 1.57*  CALCIUM 10.7*    Blood smear review: None  Pathology: None    Assessment and Plan: Ms. Lauf is a 78 year old African-American female.  She has metastatic breast cancer.  We really need to get her pain under better control.  Again, she will need an MRI of the lumbar spine.  Hopefully, the changes at L1 are from degeneration and arthritis from the corpectomy and not from metastatic disease.  She deftly needs IV fluids.  We will have to also get MRI of the brain to see why she has this right cranial nerve VII deficit.  I will give her some IV steroids.  She will also need some Zometa.  She is feeling a little bit better.  Hopefully, she will continue to improve.  Her blood pressure is up a little bit.  I think we have to get her blood pressure down somewhat.  I am not sure why she stopped taking her letrozole.  We have to get her back on letrozole.  I think when she is an outpatient, she will probably need Ibrance or ribociclib.  We also might consider getting her on Faslodex.  She was on Herceptin.  We have to get that restarted.  We also might  consider adding Perjeta.  Hopefully, she will just be in the hospital for a couple days while we get her pain under better control.  [name], MD  [place] 7:7-8

## 2018-10-06 ENCOUNTER — Ambulatory Visit
Admission: RE | Admit: 2018-10-06 | Discharge: 2018-10-06 | Disposition: A | Discharge status: Home / Self Care | Payer: Medicare Other | Source: Ambulatory Visit | Attending: Radiation Oncology | Admitting: Radiation Oncology

## 2018-10-06 DIAGNOSIS — D649 Anemia, unspecified: Secondary | ICD-10-CM

## 2018-10-06 DIAGNOSIS — Z170 Estrogen receptor positive status [ER+]: Principal | ICD-10-CM

## 2018-10-06 DIAGNOSIS — C7951 Secondary malignant neoplasm of bone: Secondary | ICD-10-CM

## 2018-10-06 DIAGNOSIS — C50411 Malignant neoplasm of upper-outer quadrant of right female breast: Secondary | ICD-10-CM

## 2018-10-06 DIAGNOSIS — C50919 Malignant neoplasm of unspecified site of unspecified female breast: Secondary | ICD-10-CM

## 2018-10-06 DIAGNOSIS — H02401 Unspecified ptosis of right eyelid: Secondary | ICD-10-CM

## 2018-10-06 DIAGNOSIS — C7931 Secondary malignant neoplasm of brain: Secondary | ICD-10-CM

## 2018-10-06 LAB — CBC WITH DIFFERENTIAL/PLATELET
Abs Immature Granulocytes: 0.04 10*3/uL (ref 0.00–0.07)
Basophils Absolute: 0 10*3/uL (ref 0.0–0.1)
Basophils Relative: 0 %
Eosinophils Absolute: 0 10*3/uL (ref 0.0–0.5)
Eosinophils Relative: 0 %
HCT: 28.3 % — ABNORMAL LOW (ref 36.0–46.0)
Hemoglobin: 8.3 g/dL — ABNORMAL LOW (ref 12.0–15.0)
Immature Granulocytes: 0 %
Lymphocytes Relative: 9 %
Lymphs Abs: 0.9 10*3/uL (ref 0.7–4.0)
MCH: 26.3 pg (ref 26.0–34.0)
MCHC: 29.3 g/dL — ABNORMAL LOW (ref 30.0–36.0)
MCV: 89.8 fL (ref 80.0–100.0)
Monocytes Absolute: 0.2 10*3/uL (ref 0.1–1.0)
Monocytes Relative: 2 %
Neutro Abs: 9.2 10*3/uL — ABNORMAL HIGH (ref 1.7–7.7)
Neutrophils Relative %: 89 %
Platelets: 262 10*3/uL (ref 150–400)
RBC: 3.15 MIL/uL — ABNORMAL LOW (ref 3.87–5.11)
RDW: 17.8 % — ABNORMAL HIGH (ref 11.5–15.5)
WBC: 10.3 10*3/uL (ref 4.0–10.5)
nRBC: 0 % (ref 0.0–0.2)

## 2018-10-06 LAB — COMPREHENSIVE METABOLIC PANEL
ALT: 8 U/L (ref 0–44)
AST: 24 U/L (ref 15–41)
Albumin: 2.9 g/dL — ABNORMAL LOW (ref 3.5–5.0)
Alkaline Phosphatase: 85 U/L (ref 38–126)
Anion gap: 6 (ref 5–15)
BUN: 20 mg/dL (ref 8–23)
CO2: 25 mmol/L (ref 22–32)
Calcium: 8.7 mg/dL — ABNORMAL LOW (ref 8.9–10.3)
Chloride: 108 mmol/L (ref 98–111)
Creatinine, Ser: 1.13 mg/dL — ABNORMAL HIGH (ref 0.44–1.00)
GFR calc Af Amer: 55 mL/min — ABNORMAL LOW (ref >60)
GFR calc non Af Amer: 47 mL/min — ABNORMAL LOW (ref >60)
Glucose, Bld: 150 mg/dL — ABNORMAL HIGH (ref 70–99)
Potassium: 4.9 mmol/L (ref 3.5–5.1)
Sodium: 139 mmol/L (ref 135–145)
Total Bilirubin: 0.5 mg/dL (ref 0.3–1.2)
Total Protein: 6.8 g/dL (ref 6.5–8.1)

## 2018-10-06 LAB — URINE CULTURE
Culture: <10000 — AB
Special Requests: NORMAL

## 2018-10-06 MED ORDER — ENOXAPARIN SODIUM 40 MG/0.4ML ~~LOC~~ SOLN
40.0000 mg | SUBCUTANEOUS | Status: DC
  Administered 2018-10-06 – 2018-10-07 (×2): 40 mg via SUBCUTANEOUS
  Filled 2018-10-06 (×2): qty 0.4

## 2018-10-06 MED ORDER — FENTANYL 12 MCG/HR TD PT72
1.0000 | MEDICATED_PATCH | TRANSDERMAL | Status: DC
  Administered 2018-10-06 – 2018-10-09 (×2): 1 via TRANSDERMAL
  Filled 2018-10-06 (×2): qty 1

## 2018-10-06 NOTE — Progress Notes (Signed)
Radiation Oncology         [phone] ________________________________  Name: Michelle Bowen        MRN: [id number]  Date of Service: 10/06/2018 DOB: Jun 09, 1941  JS:[id number], No Pcp Per  No ref. provider found     REFERRING PHYSICIAN: No ref. provider found   DIAGNOSIS: The primary encounter diagnosis was Bone metastases ([place]). Diagnoses of Malignant neoplasm of upper-outer quadrant of right breast in female, estrogen receptor positive ([place]), Counseling regarding goals of care, Malignant neoplasm of upper-outer quadrant of right female breast, unspecified estrogen receptor status ([place]), Pain aggravated by coughing, Carcinoma of right breast metastatic to bone [facility]), Back pain, and Carcinoma of breast metastatic to bone, unspecified laterality [facility]) were also pertinent to this visit.   HISTORY OF PRESENT ILLNESS: Michelle Bowen is a 78 y.o. female seen at the request of Dr. 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 She was treated surgically for her breast disease in August 2018 and had Stage III, pT2N3aM0 disease. She was counseled on the role of adjuvant chemotherapy though declined. She did however accept adjuvant radiotherapy to the breast and regional nodes with Dr. Lisbeth Renshaw. She was lost to follow up until she was diagnosed with recurrence in the RLL and T12-L1 spine in March 2018 at [facility]. When she returned to [facility], MRI of the thoracic spine on 08/26/16 revealed concerns for metastatic disease with the largest lesions at T6, T8, T12, and L1 with 40% pathologic compression deformity, and foraminal narrowing from T12-L1 and L1-2.  No cord abnormalitiy or edema was noted. She was started on steroids and met with Dr. Lisbeth Renshaw and I and our plan was to proceed with radiotherapy. She decided to relocate to live closer to her son in [place] and receive care at [facility] She apparently  had a vertebral augmentation in April 2018, followed by palliative radiotherapy to the L spine and Pelvis. She underwent lumbar laminectomy and stabilization surgery at [facility] in [place]. She proceeded with antiestrogen, Taxotere/Herceptin/Perjecta and completed 6 cycles of this. She relocated in October 2018 back to [place]. She decided to relocate her oncology care from Dr. [name] to Dr. Marin Olp due to the clinic location. She has been under Dr. Antonieta Pert care resumed Letrozole and Herceptin in December 2018. That infusion was held due to tolerability and other options of systemic therapy were considered. She did not return in January 2020 for follow up, but called complaining of facial drooping. She was seen in the ED at [facility], but left AMA before she could have scans performed. She returned to Dr. Antonieta Pert attention on 10/04/2018 due to progressive chest wall, back, and facial pain; and with her evaluation, she was encouraged to proceed with admission. Upon admission, she had imaging of the brain and spine Monday and yesterday of this week. Unfortunately this indicates significant disease in the brain, a 3.1 x 1.8 cm mass in the left middle cranial fossa with a broad interface involving the dura and appears extraaxial. A mixed cystic and solid appearing mass in the left temporal lobe was also noted measuring 3.7 x 2.5 cm with moderately extensive vasogenic edema. There was mild mass effect on the left cerebral peduncle and an enhancing mass involving the temporal bone with dural involvement along the anterior, posterior, and superior aspects of the petrous bone. It appears to extend into the right internal auditory canal. Her  spine images reveal concerns for multiple rib metastases and a compression fracture at T4 with 50% heigh loss, sclerotic lesions at T6, and T8, as well as lucency that extends into both pedicles and posterior elements. Because of these findings, we've been asked to  evaluate the patient for radiation.     PREVIOUS RADIATION THERAPY: Yes  09/25/16-10/06/16:  28 Gy to the lumbar spine and pelvis in 8 fxns with Dr. Doreen Beam at [place]  03/17/16-05/05/16: 1. Right breast/ 50.4 Gy in 28 fractions 2. Right breast boost/ 16 Gy in 8 fractions 3. Right axilla / 50.4 Gy in 28 fractions  PAST MEDICAL HISTORY:  Past Medical History:  Diagnosis Date   Arthritis    bil knees, left thumb   Breast cancer ([place]) 12/06/15   Right   Cancer, metastatic to bone ([place]) 08/2016   Counseling regarding goals of care 06/11/2017   Hypertension        PAST SURGICAL HISTORY: Past Surgical History:  Procedure Laterality Date   BREAST LUMPECTOMY WITH AXILLARY LYMPH NODE DISSECTION Right 01/17/2016   Procedure: RIGHT BREAST LUMPECTOMY WITH AXILLARY LYMPH NODE DISSECTION;  Surgeon: [name], MD;  Location: [place];  Service: General;  Laterality: Right;   BREAST SURGERY Right 2001   sebaceous cyst removal   RADIOACTIVE SEED GUIDED EXCISIONAL BREAST BIOPSY Left 01/17/2016   Procedure: RADIOACTIVE SEED GUIDED EXCISIONAL LEFT BREAST BIOPSY;  Surgeon: [name], MD;  Location: [place];  Service: General;  Laterality: Left;   RE-EXCISION OF BREAST LUMPECTOMY Right 02/05/2016   Procedure: RE-EXCISION OF BREAST LUMPECTOMY AND REMOVAL OF NIPPLE;  Surgeon: [name], MD;  Location: [place];  Service: General;  Laterality: Right;   TONSILLECTOMY       FAMILY HISTORY:  Family History  Problem Relation Age of Onset   Breast cancer Sister 76       breast   Breast cancer Other        neice at 15   Breast cancer Other        neice at 26     SOCIAL HISTORY:  reports that she has quit smoking. She has never used smokeless tobacco. She reports current alcohol use. She reports that she does not use drugs. The patient is single. She's a retired [employer].   ALLERGIES: Patient has no known  allergies.   MEDICATIONS:  Current Facility-Administered Medications  Medication Dose Route Frequency Provider Last Rate Last Dose   0.9 %  sodium chloride infusion   Intravenous Continuous Volanda Napoleon, MD 50 mL/hr at 10/06/18 1147     acidophilus (RISAQUAD) capsule 1 capsule  1 capsule Oral Daily Volanda Napoleon, MD   1 capsule at 10/06/18 0958   dexamethasone (DECADRON) injection 20 mg  20 mg Intravenous Q24H Volanda Napoleon, MD   20 mg at 10/05/18 2015   enoxaparin (LOVENOX) injection 40 mg  40 mg Subcutaneous Q24H Volanda Napoleon, MD       famotidine (PEPCID) tablet 40 mg  40 mg Oral BID Volanda Napoleon, MD   40 mg at 10/06/18 0958   fentaNYL (DURAGESIC) 12 MCG/HR 1 patch  1 patch Transdermal Q72H Volanda Napoleon, MD   1 patch at 10/06/18 1147   gabapentin (NEURONTIN) capsule 300 mg  300 mg Oral TID Volanda Napoleon, MD   300 mg at 10/06/18 0959   gadobenate dimeglumine (MULTIHANCE) injection 6.5 mL  6.5 mL Intravenous Once PRN Ennever,  Rudell Cobb, MD       HYDROmorphone (DILAUDID) injection 2 mg  2 mg Intravenous Q4H PRN Volanda Napoleon, MD   2 mg at 10/06/18 0542   irbesartan (AVAPRO) tablet 75 mg  75 mg Oral Daily Volanda Napoleon, MD   75 mg at 10/06/18 8527   letrozole [facility]) tablet 2.5 mg  2.5 mg Oral Daily Volanda Napoleon, MD   2.5 mg at 10/06/18 7824   multivitamin with minerals tablet 1 tablet  1 tablet Oral Daily Volanda Napoleon, MD   1 tablet at 10/06/18 2353   oxyCODONE (Oxy IR/ROXICODONE) immediate release tablet 10 mg  10 mg Oral Q4H PRN Volanda Napoleon, MD   10 mg at 10/05/18 2015   polyethylene glycol (MIRALAX / GLYCOLAX) packet 17 g  17 g Oral Daily Volanda Napoleon, MD   17 g at 10/06/18 0959   sodium chloride flush (NS) 0.9 % injection 10-40 mL  10-40 mL Intracatheter Q12H Volanda Napoleon, MD   10 mL at 10/06/18 1001   sodium chloride flush (NS) 0.9 % injection 10-40 mL  10-40 mL Intracatheter PRN Volanda Napoleon, MD   10 mL at 10/05/18 0919     temazepam (RESTORIL) capsule 7.5 mg  7.5 mg Oral QHS PRN Volanda Napoleon, MD         REVIEW OF SYSTEMS: On review of systems, the patient reports that she is having pain in her head, some numbness in her face, and pain in her sides, on the right along her underarm, and on the left below her breast. No other complaints are verbalized.     PHYSICAL EXAM:  Wt Readings from Last 3 Encounters:  10/06/18 149 lb 14.6 oz (68 kg)  10/04/18 142 lb (64.4 kg)  06/03/18 156 lb 6 oz (70.9 kg)   Temp Readings from Last 3 Encounters:  10/06/18 (!) 97.5 F (36.4 C) (Oral)  10/04/18 98.5 F (36.9 C) (Oral)  07/14/18 98.5 F (36.9 C) (Oral)   BP Readings from Last 3 Encounters:  10/06/18 (!) 198/97  10/04/18 (!) 173/84  07/14/18 (!) 189/93   Pulse Readings from Last 3 Encounters:  10/06/18 (!) 57  10/04/18 81  07/14/18 87   Pain Assessment Pain Score: 6 /10    ECOG = 1  0 - Asymptomatic (Fully active, able to carry on all predisease activities without restriction)  1 - Symptomatic but completely ambulatory (Restricted in physically strenuous activity but ambulatory and able to carry out work of a light or sedentary nature. For example, light housework, office work)  2 - Symptomatic, <50% in bed during the day (Ambulatory and capable of all self care but unable to carry out any work activities. Up and about more than 50% of waking hours)  3 - Symptomatic, >50% in bed, but not bedbound (Capable of only limited self-care, confined to bed or chair 50% or more of waking hours)  4 - Bedbound (Completely disabled. Cannot carry on any self-care. Totally confined to bed or chair)  5 - Death   Eustace Pen MM, Creech RH, Tormey DC, et al. [phone]). "Toxicity and response criteria of the [facility]". [place] Oncol. 5 (6): 649-55    LABORATORY DATA:  Lab Results  Component Value Date   WBC 10.3 10/06/2018   HGB 8.3 (L) 10/06/2018   HCT 28.3 (L) 10/06/2018   MCV  89.8 10/06/2018   PLT 262 10/06/2018   Lab Results  Component  Value Date   NA 139 10/06/2018   K 4.9 10/06/2018   CL 108 10/06/2018   CO2 25 10/06/2018   Lab Results  Component Value Date   ALT 8 10/06/2018   AST 24 10/06/2018   ALKPHOS 85 10/06/2018   BILITOT 0.5 10/06/2018      RADIOGRAPHY: X-ray Chest Pa And Lateral  Result Date: 10/04/2018 CLINICAL DATA:  Chest pain, metastatic breast cancer EXAM: CHEST - 2 VIEW COMPARISON:  None. FINDINGS: There is a left-sided Port-A-Cath in satisfactory position. There is no focal parenchymal opacity. There is no pleural effusion or pneumothorax. The heart and mediastinal contours are unremarkable. The osseous structures are unremarkable. IMPRESSION: No active cardiopulmonary disease. Electronically Signed   By: Kathreen Devoid   On: 10/04/2018 19:41   Ct Thoracic Spine Wo Contrast  Result Date: 10/04/2018 CLINICAL DATA:  Metastatic breast cancer EXAM: CT THORACIC AND LUMBAR SPINE WITHOUT CONTRAST TECHNIQUE: Multidetector CT imaging of the thoracic and lumbar spine was performed without contrast. Multiplanar CT image reconstructions were also generated. COMPARISON:  Chest CT 05/24/2018 FINDINGS: CT THORACIC SPINE FINDINGS Alignment: Normal Vertebrae: There is a compression deformity of T4 with approximately 50% anterior height loss is new compared to 05/24/2018. There is no retropulsion. There are partially sclerotic lesions at T6 and T8, less distinct than on the prior study. T8, there is increased lucency extending into both pedicles and the posterior elements. Paraspinal and other soft tissues: There are multiple rib lesions including expansile lesions of the left third and sixth ribs. There is a left lower lobe pulmonary nodule measuring 8 mm. Disc levels: There is no large thoracic disc herniation. No spinal canal stenosis. No high-grade foraminal narrowing. CT LUMBAR SPINE FINDINGS Segmentation: Standard Alignment: Normal alignment below the level  of the T12-L2 fusion. Vertebrae: The status post L1 corpectomy with left lateral T12-L2 fusion. Sclerotic change of the L3 vertebral body with large superior endplate Schmorl's node is unchanged. Faint sclerosis within the lower anterior aspect of L5. Paraspinal and other soft tissues: Mild calcific aortic atherosclerosis. Disc levels: At the L1 level, there is ossification at the posterior aspect of the corpectomy site that causes moderate narrowing of the left lateral recess and mild central spinal canal stenosis. There is moderate-to-severe narrowing of the left L1-2 neural foramen. There is no other lumbar spinal canal stenosis. At L5-S1, there is bilateral endplate spurring and mild disc bulge causing mild bilateral neural foraminal stenosis. IMPRESSION: CT THORACIC SPINE IMPRESSION 1. T4 compression fracture with 50% height loss, new compared to 05/24/2018. No associated retropulsion or spinal canal stenosis. 2. Sclerotic lesions at T6 and T8 are less distinct than on the prior study. These may be treated metastatic lesions. At T8, there is lucency that extends into both pedicles and the posterior elements which could predispose to pathologic fracture. 3. Multiple metastases to the ribs. CT LUMBAR SPINE IMPRESSION 1. T12-L2 fusion with L1 corpectomy. There is mild spinal canal stenosis at this level secondary to ossification at the posterior aspect of the corpectomy site. There is also moderate left L1-2 neural foraminal stenosis. 2. Partially sclerotic appearance of L3 and L5 may indicate the presence of metastatic disease, but this is unchanged compared to 05/24/2018. Electronically Signed   By: Ulyses Jarred M.D.   On: 10/04/2018 18:51   Ct Lumbar Spine Wo Contrast  Result Date: 10/04/2018 CLINICAL DATA:  Metastatic breast cancer EXAM: CT THORACIC AND LUMBAR SPINE WITHOUT CONTRAST TECHNIQUE: Multidetector CT imaging of the thoracic and lumbar spine  was performed without contrast. Multiplanar CT image  reconstructions were also generated. COMPARISON:  Chest CT 05/24/2018 FINDINGS: CT THORACIC SPINE FINDINGS Alignment: Normal Vertebrae: There is a compression deformity of T4 with approximately 50% anterior height loss is new compared to 05/24/2018. There is no retropulsion. There are partially sclerotic lesions at T6 and T8, less distinct than on the prior study. T8, there is increased lucency extending into both pedicles and the posterior elements. Paraspinal and other soft tissues: There are multiple rib lesions including expansile lesions of the left third and sixth ribs. There is a left lower lobe pulmonary nodule measuring 8 mm. Disc levels: There is no large thoracic disc herniation. No spinal canal stenosis. No high-grade foraminal narrowing. CT LUMBAR SPINE FINDINGS Segmentation: Standard Alignment: Normal alignment below the level of the T12-L2 fusion. Vertebrae: The status post L1 corpectomy with left lateral T12-L2 fusion. Sclerotic change of the L3 vertebral body with large superior endplate Schmorl's node is unchanged. Faint sclerosis within the lower anterior aspect of L5. Paraspinal and other soft tissues: Mild calcific aortic atherosclerosis. Disc levels: At the L1 level, there is ossification at the posterior aspect of the corpectomy site that causes moderate narrowing of the left lateral recess and mild central spinal canal stenosis. There is moderate-to-severe narrowing of the left L1-2 neural foramen. There is no other lumbar spinal canal stenosis. At L5-S1, there is bilateral endplate spurring and mild disc bulge causing mild bilateral neural foraminal stenosis. IMPRESSION: CT THORACIC SPINE IMPRESSION 1. T4 compression fracture with 50% height loss, new compared to 05/24/2018. No associated retropulsion or spinal canal stenosis. 2. Sclerotic lesions at T6 and T8 are less distinct than on the prior study. These may be treated metastatic lesions. At T8, there is lucency that extends into both  pedicles and the posterior elements which could predispose to pathologic fracture. 3. Multiple metastases to the ribs. CT LUMBAR SPINE IMPRESSION 1. T12-L2 fusion with L1 corpectomy. There is mild spinal canal stenosis at this level secondary to ossification at the posterior aspect of the corpectomy site. There is also moderate left L1-2 neural foraminal stenosis. 2. Partially sclerotic appearance of L3 and L5 may indicate the presence of metastatic disease, but this is unchanged compared to 05/24/2018. Electronically Signed   By: Ulyses Jarred M.D.   On: 10/04/2018 18:51   Mr Jeri Cos YO Contrast  Result Date: 10/05/2018 CLINICAL DATA:  Metastatic breast cancer. Right cranial nerve VII dysfunction. EXAM: MRI HEAD WITHOUT AND WITH CONTRAST TECHNIQUE: Multiplanar, multiecho pulse sequences of the brain and surrounding structures were obtained without and with intravenous contrast. CONTRAST:  6.5 mL Gadavist COMPARISON:  Head CT 06/27/2010 FINDINGS: Brain: An enhancing mass involves the temporal bone on the right, particularly the petrous apex. There is evidence of dural involvement along the anterior, posterior, and superior aspects of the petrous bone, and enhancement extends into the right internal auditory canal and along the course of the facial nerve including involvement of the geniculate ganglion and tympanic segment of the nerve. Abnormal enhancement extends to the margins of Meckel's cave on the right. A heterogeneously enhancing mass anteriorly in the left middle cranial fossa has a broad interface with the dura and appears to be extra-axial, measuring 3.1 x 1.8 cm. A mixed cystic and solid mass posterior to this in the left temporal lobe appears to be intra-axial and measures 3.7 x 2.5 cm with moderately extensive surrounding vasogenic edema which extends into the white matter tracts in the subinsular and posterior basal  ganglia regions. There is associated regional sulcal effacement and mild mass  effect on the left cerebral peduncle. A small amount of susceptibility artifact within the mass may represent chronic blood products or mineralization. No acute infarct, midline shift, or extra-axial fluid collection is evident. Patchy T2 hyperintensities in the cerebral white matter bilaterally are nonspecific but compatible with moderately advanced chronic small vessel ischemic disease. Mild cerebral atrophy is not greater than expected for age. Vascular: Major intracranial vascular flow voids are preserved. Skull and upper cervical spine: Left occipital and parietal skull lesions. Sinuses/Orbits: Unremarkable orbits. Minimal posterior left ethmoid air cell mucosal thickening. Small right mastoid effusion. Other: None. IMPRESSION: 1. Multiple intracranial masses consistent with metastatic disease. 2. A lesion in the petrous temporal bone on the right involves the internal auditory canal and facial nerve. 3. Mixed cystic and solid left temporal lobe metastasis with moderate vasogenic edema. 4. Likely dural-based metastasis anteriorly in the left middle cranial fossa. 5. Skull metastases. 6. Moderate chronic small vessel ischemic disease. Electronically Signed   By: Logan Bores M.D.   On: 10/05/2018 16:02   Mr Lumbar Spine W Wo Contrast  Result Date: 10/05/2018 CLINICAL DATA:  Metastatic breast cancer.  Prior L1 corpectomy. EXAM: MRI LUMBAR SPINE WITHOUT AND WITH CONTRAST TECHNIQUE: Multiplanar and multiecho pulse sequences of the lumbar spine were obtained without and with intravenous contrast. CONTRAST:  6.5 mL Gadavist intravenous contrast. COMPARISON:  CT lumbar spine from yesterday. CT abdomen pelvis dated May 24, 2018. FINDINGS: Segmentation:  Standard. Alignment:  Physiologic. Vertebrae: Prior L1 corpectomy with left lateral T12-L2 fusion. Subtle patchy decreased T2 and T1 marrow signal in the L3 and L5 vertebral bodies corresponds to the sclerosis seen on CT. No other focal bone lesion. Unchanged  Schmorl's node involving the L3 superior endplate. No evidence of discitis. Conus medullaris and cauda equina: Conus extends to the L2-L3 level. Conus and cauda equina appear normal. No intradural enhancement. Paraspinal and other soft tissues: Prior bilateral sacroplasty. Unchanged small right renal cyst. Otherwise negative. Disc levels: T11-T12: Mild disc bulging.  No stenosis. T12-L1:  Negative. L1-L2: Unchanged left eccentric heterotopic ossification posterior to the L1 corpectomy with resultant mild mass effect and slight deformity of the left ventral cord. Mild left-sided spinal canal stenosis. Mild left lateral recess stenosis. No neuroforaminal stenosis. L2-L3:  Mild disc bulging.  No stenosis. L3-L4:  Shallow broad-based posterior disc protrusion.  No stenosis. L4-L5: Shallow broad-based posterior disc protrusion eccentric to the left. Mild bilateral facet arthropathy. No stenosis. L5-S1: Mild disc bulging and bilateral facet arthropathy. Mild left neuroforaminal stenosis. No spinal canal or right neuroforaminal stenosis. IMPRESSION: 1. Prior L1 corpectomy with T12-L2 fusion. Unchanged heterotopic ossification along the left posterior aspect of the corpectomy site resulting in mild left-sided spinal canal stenosis with slight deformity of the left ventral cord. 2. Subtle patchy decreased marrow signal in the L3 and L5 vertebral bodies corresponds to the sclerosis seen on CT and remains consistent with osseous metastatic disease. 3. Mild multilevel lumbar spondylosis as described above. Electronically Signed   By: Titus Dubin M.D.   On: 10/05/2018 15:33       IMPRESSION/PLAN: 1. Recurrent Metastatic Stage III, pT2N3aM0 ER positive, HER-2 amplified invasive ductal carcinoma of the right breast.  Unfortunately, the patient has metastatic disease to the ribs, involving the thoracic spine, and new brain disease.  I spent time with the patient reviewing the findings, trying to gain her trust as she did  not remember [place] from her previous  therapy, and outlining the rationale for why Dr. Marin Olp has requested our assistance.  We discussed the Dr. Lisbeth Renshaw would like to offer her radiotherapy to the brain, T-spine and the adjacent ribs to help alleviate her pain as well to try and control the cancer locally.  Her pain, and try to gain some control over her cancer.  I also let her know that we were asking neurosurgery to weigh in on her condition to see if they think she would benefit from surgical intervention. At the conclusion of our discussion, she was not interested in proceeding with treatment quite yet and wants to further discuss her situation with Dr. Marin Olp. We will follow up on their decision making.   In a visit lasting 70 minutes, greater than 50% of the time was spent discussing her case, and in floor time coordinating the patient's care.  The above documentation reflects my direct findings during this shared patient visit. Please see the separate note by Dr. Lisbeth Renshaw on this date for the remainder of the patient's plan of care.    Carola Rhine, PAC   Addendum:  Dr. Vertell Limber has reviewed her scans and does not feel that surgical resection is needed at this time. Rather surgery could be considered if she had local failure following radiotherapy.     Carola Rhine, PAC

## 2018-10-06 NOTE — Progress Notes (Signed)
Unfortunately, we found the reason why she has this right cranial nerve deficit.  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  I spoke with Dr. Kinard of radiation oncology.  He is aware of the findings.  I will see if maybe occupational therapy can help us with her right eye.  Maybe have some suggestions for us.  The MRI of her lumbar spine does not show any cancer at the L1 surgical site.  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  I am not sure what else can be done for that.  Maybe, a epidural injection could help her.  She is back on the letrozole.  I think we are going to have to deafly make changes with her protocol.  I will see about having her on Faslodex.  I think she probably needs to have a CDK4/CDK6 inhibitor.  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  Her pain seems to be doing a little bit better.  Again she still has the pain in the lower back.  She has pain on the left side.  I think radiation therapy will radiate her ribs.  She has had no fever.  Her appetite is improving.  She is not having any diarrhea.  Her renal function is better.  Her creatinine is 1.13.  She is more anemic.  Hemoglobin is 8.3.  White cell count 10.3.  Platelet count 262,000.  We will have to watch this closely.  I will have to check some iron studies on her.  Her vital signs are all stable.  Temperature 97.8.  Pulse 53.  Blood pressure 112/88.  Her head and neck exam shows the right ocular ptosis.  She has some weakness on the right side of her face.  She has no intraoral lesions.  Lungs are clear.  Cardiac exam regular rate and rhythm.  Abdomen is soft.  She has good bowel sounds.  There is no guarding or rebound tenderness.  There  is no obvious hepatomegaly.  Extremities shows no clubbing, cyanosis or edema.  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  We still have a lot to do for Michelle Bowen.  We really have to get her in a better shape and get a better performance status and her if she is to go home.  I will see about some long-acting pain medication for her.  Possibly, a Duragesic patch may not be a bad idea for her.  We will have to watch her anemia closely.  We may end up having to transfuse her.  This probably would be the easiest way and quickest way to improve her performance status.  I did speak to her son by cell phone.  He lives in [place].  He is try to get his house ready so that she can go down there to live with him and his wife.  I think this is a great idea.  I appreciate the great care as she is getting from all the staff on 5 E.  I know she is appreciative of the care.  Pete , MD  Hebrews 13:16 

## 2018-10-06 NOTE — Progress Notes (Signed)
Presented to patient's room today for consultation for possible L1 epidural injection due to back pain. Spoke with patient and her son via FaceTime.   Patient denies back pain or difficulty walking to me. She states that her new medicines are controlling her pain and she is not interested in an injection. She expresses to me several times that the only treatment she is interested in right now is radiation to "cure my brain tumors."   I will discontinue the order for possible injection based on my discussion with the patient today. Please reorder if patient wishes to possibly proceed with L1 injection and I will perform full consult.  Please call IR with questions or concerns.  Candiss Norse, PA-C Pager# [phone]

## 2018-10-07 ENCOUNTER — Encounter (HOSPITAL_COMMUNITY): Payer: Self-pay | Admitting: Hematology & Oncology

## 2018-10-07 ENCOUNTER — Ambulatory Visit
Admit: 2018-10-07 | Discharge: 2018-10-07 | Disposition: A | Discharge status: Home / Self Care | Payer: Medicare Other | Attending: Radiation Oncology | Admitting: Radiation Oncology

## 2018-10-07 DIAGNOSIS — D500 Iron deficiency anemia secondary to blood loss (chronic): Secondary | ICD-10-CM

## 2018-10-07 DIAGNOSIS — Z531 Procedure and treatment not carried out because of patient's decision for reasons of belief and group pressure: Secondary | ICD-10-CM

## 2018-10-07 DIAGNOSIS — C7951 Secondary malignant neoplasm of bone: Secondary | ICD-10-CM

## 2018-10-07 DIAGNOSIS — D509 Iron deficiency anemia, unspecified: Secondary | ICD-10-CM

## 2018-10-07 HISTORY — DX: Iron deficiency anemia secondary to blood loss (chronic): D50.0

## 2018-10-07 LAB — CBC WITH DIFFERENTIAL/PLATELET
Abs Immature Granulocytes: 0.06 10*3/uL (ref 0.00–0.07)
Basophils Absolute: 0 10*3/uL (ref 0.0–0.1)
Basophils Relative: 0 %
Eosinophils Absolute: 0 10*3/uL (ref 0.0–0.5)
Eosinophils Relative: 0 %
HCT: 30.9 % — ABNORMAL LOW (ref 36.0–46.0)
Hemoglobin: 9.3 g/dL — ABNORMAL LOW (ref 12.0–15.0)
Immature Granulocytes: 1 %
Lymphocytes Relative: 9 %
Lymphs Abs: 0.8 10*3/uL (ref 0.7–4.0)
MCH: 26.7 pg (ref 26.0–34.0)
MCHC: 30.1 g/dL (ref 30.0–36.0)
MCV: 88.8 fL (ref 80.0–100.0)
Monocytes Absolute: 0.1 10*3/uL (ref 0.1–1.0)
Monocytes Relative: 1 %
Neutro Abs: 7.9 10*3/uL — ABNORMAL HIGH (ref 1.7–7.7)
Neutrophils Relative %: 89 %
Platelets: 302 10*3/uL (ref 150–400)
RBC: 3.48 MIL/uL — ABNORMAL LOW (ref 3.87–5.11)
RDW: 17.9 % — ABNORMAL HIGH (ref 11.5–15.5)
WBC: 8.8 10*3/uL (ref 4.0–10.5)
nRBC: 0 % (ref 0.0–0.2)

## 2018-10-07 LAB — COMPREHENSIVE METABOLIC PANEL
ALT: 9 U/L (ref 0–44)
AST: 26 U/L (ref 15–41)
Albumin: 3.2 g/dL — ABNORMAL LOW (ref 3.5–5.0)
Alkaline Phosphatase: 94 U/L (ref 38–126)
Anion gap: 9 (ref 5–15)
BUN: 21 mg/dL (ref 8–23)
CO2: 26 mmol/L (ref 22–32)
Calcium: 8.7 mg/dL — ABNORMAL LOW (ref 8.9–10.3)
Chloride: 105 mmol/L (ref 98–111)
Creatinine, Ser: 1.02 mg/dL — ABNORMAL HIGH (ref 0.44–1.00)
GFR calc Af Amer: >60 mL/min (ref >60)
GFR calc non Af Amer: 53 mL/min — ABNORMAL LOW (ref >60)
Glucose, Bld: 139 mg/dL — ABNORMAL HIGH (ref 70–99)
Potassium: 3.9 mmol/L (ref 3.5–5.1)
Sodium: 140 mmol/L (ref 135–145)
Total Bilirubin: 0.3 mg/dL (ref 0.3–1.2)
Total Protein: 7.2 g/dL (ref 6.5–8.1)

## 2018-10-07 LAB — IRON AND TIBC
Iron: 28 ug/dL (ref 28–170)
Saturation Ratios: 9 % — ABNORMAL LOW (ref 10.4–31.8)
TIBC: 305 ug/dL (ref 250–450)
UIBC: 277 ug/dL

## 2018-10-07 LAB — FERRITIN: Ferritin: 47 ng/mL (ref 11–307)

## 2018-10-07 MED ORDER — DEXAMETHASONE SODIUM PHOSPHATE 10 MG/ML IJ SOLN
10.0000 mg | INTRAMUSCULAR | Status: DC
  Administered 2018-10-07: 21:00:00 10 mg via INTRAVENOUS
  Filled 2018-10-07: qty 1

## 2018-10-07 MED ORDER — SODIUM CHLORIDE 0.9 % IV SOLN
510.0000 mg | Freq: Once | INTRAVENOUS | Status: AC
  Administered 2018-10-07: 510 mg via INTRAVENOUS
  Filled 2018-10-07: qty 17

## 2018-10-07 MED ORDER — HYDROMORPHONE HCL 1 MG/ML IJ SOLN: 2.0000 mg | Freq: Four times a day (QID) | INTRAMUSCULAR | Status: DC | PRN

## 2018-10-07 NOTE — Progress Notes (Signed)
The patient has spoken with Dr. Marin Olp and has decided to proceed with radiotherapy to the ribs/T spine and brain. I called up to the patient's room and let her know we could plan her treatment in a process called simulation where we would make some paint markings on the skin of her chest, and a mask that she would wear during her treatment. She would need to start treatment next week to account for her planning that takes into consideration her prior treatment sites. She is in agreement.     Carola Rhine, PAC

## 2018-10-07 NOTE — Progress Notes (Signed)
Ms. Susan she has been doing a little bit better this morning.  She does not have as much discomfort.  We did start a Duragesic patch on her.  Hopefully, this will help with some of the discomfort.  I talked to her at length about radiation treatments.  I explained to her why we have to do radiation treatments.  I told her that radiation is the [place] way to help her pain.  She has pain in her ribs.  She has the Bell's palsy.  This is from tumors that are in the brain.  I think she is much more accommodating to have radiation.  She is iron deficient.  We did iron studies on her yesterday.  She had a ferritin of 47 with an iron saturation of only 9%.  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  I told her that the IV iron was safe and was not blood.  She understands this.  Her appetite is doing a little bit better.  She has had no nausea or vomiting.  She is getting out of bed a little bit.  The Bell's palsy seems to be looking a little bit better.  Her renal function is doing much better now.  Her creatinine is down to 1.02.  Her white cell count is 8.8.  Hemoglobin 9.3 and platelet count 302,000.  There really is no change on her physical examination.  All of her vital signs are relatively stable.  We will have to see what her length of radiation treatments will be.  Hopefully, we will be able to let her go home soon and she can have radiation as an outpatient.  I told her that we would make changes with her treatment protocol.  I would get her on Faslodex and Verzenio.  I would also is get her on Kadcyla as her tumor is HER-2 positive.  I appreciate the great care that she is getting from all the staff on 5 E.  [name], MD  Hebrews 13:16

## 2018-10-07 NOTE — Evaluation (Signed)
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     Clinical Impression   OT was consulted to see if we could do anything to help pt's R eye which is affected by Bell's Palsy due to CN VII involvement.  Pt feels that her home set up is fine and she hasn't been having any difficulty with adls. Luckily, pt's vision is not giving her difficulty.   Offered a few suggestions to help manage R eye.        Follow Up Recommendations  No OT follow up    Equipment Recommendations  None recommended by OT    Recommendations for Other Services       Precautions / Restrictions Precautions Precautions: Fall Restrictions Weight Bearing Restrictions: No      Mobility Bed Mobility      independent            Transfers    independent                  Balance                                           ADL either performed or assessed with clinical judgement   ADL Overall ADL's : At baseline                                       General ADL Comments: pt reports that she is able to perform ADLs independently. She was up using bathroom on her own     Vision         Perception     Praxis      Pertinent Vitals/Pain Pain Assessment: Faces Faces Pain Scale: Hurts whole lot Pain Location: back, ribs Pain Descriptors / Indicators: Aching Pain Intervention(s): Limited activity within patient's tolerance;Monitored during session;Patient requesting pain meds-RN notified     Hand Dominance     Extremity/Trunk Assessment             Communication Communication Communication: No difficulties   Cognition Arousal/Alertness: Awake/alert Behavior During Therapy: WFL for tasks assessed/performed                                   General  Comments: wfls during OT   General Comments  Order received to see if OT could help with her R eye. She has a Bells Palsy from brain mets.  Pt reports no visual difficulty; she cannot close it completely, it tears up often, and will not close fully.  She is already using over the counter drops.  Talked to her about potentially using an eye shield to protect it at night, and she tried something but didn't sleep as well with it vs without. I think this may have touched her eye.  A shield or 3D eye patch would not touch eyelid and might be more comfortable.  Pt states she will think about it, but it wasn't something she wanted to pursue right now.  Talked to her about using paper tape to close eye fully, but she is  not interested in this. Also talked to her about eye gel at night.  Gave her a little tele monitor bag with small bag of ice to use intermittently for 15 minutes at a time to help with edema.  She was agreeable to this. Also talked to NT about new bags of ice intermittently    Exercises     Shoulder Instructions      Home Living Family/patient expects to be discharged to:: Private residence Living Arrangements: Alone Available Help at Discharge: Friend(s)                         Home Equipment: None   Additional Comments: Pt states her home set up is fine; she hasn't been having difficulty doing anything      Prior Functioning/Environment Level of Independence: Independent                 OT Problem List: Increased edema      OT Treatment/Interventions:      OT Goals(Current goals can be found in the care plan section) Acute Rehab OT Goals Patient Stated Goal: get eye back to normal OT Goal Formulation: All assessment and education complete, DC therapy  OT Frequency:     Barriers to D/C:            Co-evaluation              AM-PAC OT "6 Clicks" Daily Activity     Outcome Measure Help from another person eating meals?: None Help from another  person taking care of personal grooming?: None Help from another person toileting, which includes using toliet, bedpan, or urinal?: None Help from another person bathing (including washing, rinsing, drying)?: None Help from another person to put on and taking off regular upper body clothing?: None Help from another person to put on and taking off regular lower body clothing?: None 6 Click Score: 24   End of Session    Activity Tolerance: Patient tolerated treatment well Patient left: in bed;with call bell/phone within reach  OT Visit Diagnosis: Other symptoms and signs involving the nervous system (R29.898)                Time: 1110-1120 OT Time Calculation (min): 10 min Charges:  OT General Charges $OT Visit: 1 Visit OT Evaluation $OT Eval Low Complexity: [place], OTR/L Acute Rehabilitation Services [phone] [facility] pager [phone] office 10/07/2018  [place] 10/07/2018, 11:27 AM

## 2018-10-07 NOTE — Progress Notes (Signed)
I spoke with the patient's son Magdalen Spatz and put his contact information in Ms. Reinecke's chart. I explained to him the timeline of our involvement in her care and leading up until now the choices she has made for her treatment as well as the current scenario is is in. He is contemplating driving to [place] to stay with her and care for her during her XRT treatment, versus a cousin who is an Therapist, sports coming to [place] to do this, versus he driving to [place] to come take her back to [facility] for all of her treatment. He will let us know what choices he and his mother make for her care. I let him know what we've already planned and if we need to cancel this to let us know as soon as he can. I gave him the contact information for [facility] in Rad and Med Onc and we will follow up with their decisions. For now we will keep her XRT plans in place.     Carola Rhine, PAC

## 2018-10-07 NOTE — Care Management Important Message (Signed)
Important Message  Patient Details IM Letter given to the Case Manager to present to the Patient Name: Michelle Bowen MRN: [id number] Date of Birth: 1941-03-19   Medicare Important Message Given:  Yes    Kerin Salen 10/07/2018, 2:35 PM

## 2018-10-08 ENCOUNTER — Encounter (HOSPITAL_COMMUNITY): Payer: Self-pay | Admitting: Radiology

## 2018-10-08 ENCOUNTER — Inpatient Hospital Stay (HOSPITAL_COMMUNITY): Payer: Medicare Other

## 2018-10-08 LAB — CBC WITH DIFFERENTIAL/PLATELET
Abs Immature Granulocytes: 0.03 10*3/uL (ref 0.00–0.07)
Basophils Absolute: 0 10*3/uL (ref 0.0–0.1)
Basophils Relative: 0 %
Eosinophils Absolute: 0.2 10*3/uL (ref 0.0–0.5)
Eosinophils Relative: 3 %
HCT: 27.7 % — ABNORMAL LOW (ref 36.0–46.0)
Hemoglobin: 8.3 g/dL — ABNORMAL LOW (ref 12.0–15.0)
Immature Granulocytes: 0 %
Lymphocytes Relative: 13 %
Lymphs Abs: 0.9 10*3/uL (ref 0.7–4.0)
MCH: 26.2 pg (ref 26.0–34.0)
MCHC: 30 g/dL (ref 30.0–36.0)
MCV: 87.4 fL (ref 80.0–100.0)
Monocytes Absolute: 0.1 10*3/uL (ref 0.1–1.0)
Monocytes Relative: 2 %
Neutro Abs: 6.2 10*3/uL (ref 1.7–7.7)
Neutrophils Relative %: 82 %
Platelets: 310 10*3/uL (ref 150–400)
RBC: 3.17 MIL/uL — ABNORMAL LOW (ref 3.87–5.11)
RDW: 17.5 % — ABNORMAL HIGH (ref 11.5–15.5)
WBC: 7.5 10*3/uL (ref 4.0–10.5)
nRBC: 0 % (ref 0.0–0.2)

## 2018-10-08 LAB — COMPREHENSIVE METABOLIC PANEL
ALT: 9 U/L (ref 0–44)
AST: 28 U/L (ref 15–41)
Albumin: 3.1 g/dL — ABNORMAL LOW (ref 3.5–5.0)
Alkaline Phosphatase: 92 U/L (ref 38–126)
Anion gap: 7 (ref 5–15)
BUN: 20 mg/dL (ref 8–23)
CO2: 28 mmol/L (ref 22–32)
Calcium: 8.2 mg/dL — ABNORMAL LOW (ref 8.9–10.3)
Chloride: 105 mmol/L (ref 98–111)
Creatinine, Ser: 1 mg/dL (ref 0.44–1.00)
GFR calc Af Amer: >60 mL/min (ref >60)
GFR calc non Af Amer: 55 mL/min — ABNORMAL LOW (ref >60)
Glucose, Bld: 136 mg/dL — ABNORMAL HIGH (ref 70–99)
Potassium: 3.5 mmol/L (ref 3.5–5.1)
Sodium: 140 mmol/L (ref 135–145)
Total Bilirubin: 0.7 mg/dL (ref 0.3–1.2)
Total Protein: 7.1 g/dL (ref 6.5–8.1)

## 2018-10-08 LAB — VITAMIN D 25 HYDROXY (VIT D DEFICIENCY, FRACTURES): Vit D, 25-Hydroxy: 37.8 ng/mL (ref 30.0–100.0)

## 2018-10-08 MED ORDER — IOHEXOL 300 MG/ML  SOLN
100.0000 mL | Freq: Once | INTRAMUSCULAR | Status: AC | PRN
  Administered 2018-10-08: 100 mL via INTRAVENOUS

## 2018-10-08 MED ORDER — DEXAMETHASONE 4 MG PO TABS
8.0000 mg | ORAL_TABLET | Freq: Every day | ORAL | Status: DC
  Administered 2018-10-08 – 2018-10-09 (×2): 8 mg via ORAL
  Filled 2018-10-08 (×2): qty 2

## 2018-10-08 MED ORDER — SODIUM CHLORIDE (PF) 0.9 % IJ SOLN
INTRAMUSCULAR | Status: AC
  Administered 2018-10-08: 22:00:00
  Filled 2018-10-08: qty 50

## 2018-10-08 NOTE — Progress Notes (Signed)
Michelle Bowen is making some nice progress.  I very much appreciate radiation oncology's incredible care yesterday.  They really did a wonderful job in getting things set up for Michelle Bowen to start radiation next week.  This really helped alleviate a lot of Michelle Bowen's concerns.  Her pain seems to be doing okay.  Hopefully the fentanyl patch is starting to help a little bit.  We do need to get her walking a little bit more.  Hopefully she will do this today.  I spoke to her son in [place] yesterday.  He is going to come up on Saturday to pick her up and get her home.  Then they will decide if she will go back to [facility] with him.  She got IV iron yesterday.  Her labs today show a white cell count of 7.5.  Hemoglobin 8.3.  Platelet count 310,000.  Her creatinine is now down to 1.  Calcium is 8.2.  Albumin 3.1.  Her vitamin D level is okay at 24.  She is eating all right.  I think she will be better when she gets home.  She has had no nausea or vomiting.  Her right eye seems to be doing a little bit better.  Is does not appear to be as erythematous.  She is going to the bathroom.  She has had no rashes.  She still is having some abdominal pain.  I will see about a CT scan of the abdomen and pelvis today.  Her vital signs show temperature of 97.5.  Pulse 47.  Blood pressure 188/74.  Her head neck exam shows the right cranial nerve deficit.  She has no oral lesions.  There is no thrush.  Lungs are clear bilaterally.  Cardiac exam regular rate and rhythm with no murmurs, rubs or bruits.  Abdomen is soft.  She has decent bowel sounds.  There is no fluid wave.  There is no palpable hepatosplenomegaly.  Extremities shows no clubbing, cyanosis or edema.  Skin exam shows no rashes.  Neurological exam is nonfocal outside of the right cranial nerve VII deficit.  Michelle Bowen has the metastatic breast cancer.  I think our treatment changes want to be made with her outside of the hospital.  She will stay on  letrozole for right now.  Again, she will plan to go home tomorrow.  Hopefully we can get her walking down the hall a little bit today which will help her confidence and will help her performance status so that she will be more functional when she does go home.  I appreciate the outstanding care that she is getting from all the staff on 5 E.  [name], MD  [place] 5:16

## 2018-10-08 NOTE — Progress Notes (Signed)
Bedside shift report received, assumed care. Patient resting in bed with eyes closed. Will continue to monitor. 

## 2018-10-09 DIAGNOSIS — N289 Disorder of kidney and ureter, unspecified: Secondary | ICD-10-CM

## 2018-10-09 LAB — CBC WITH DIFFERENTIAL/PLATELET
Abs Immature Granulocytes: 0.05 10*3/uL (ref 0.00–0.07)
Basophils Absolute: 0 10*3/uL (ref 0.0–0.1)
Basophils Relative: 0 %
Eosinophils Absolute: 0 10*3/uL (ref 0.0–0.5)
Eosinophils Relative: 0 %
HCT: 30.4 % — ABNORMAL LOW (ref 36.0–46.0)
Hemoglobin: 9 g/dL — ABNORMAL LOW (ref 12.0–15.0)
Immature Granulocytes: 1 %
Lymphocytes Relative: 22 %
Lymphs Abs: 1.6 10*3/uL (ref 0.7–4.0)
MCH: 25.9 pg — ABNORMAL LOW (ref 26.0–34.0)
MCHC: 29.6 g/dL — ABNORMAL LOW (ref 30.0–36.0)
MCV: 87.6 fL (ref 80.0–100.0)
Monocytes Absolute: 0.5 10*3/uL (ref 0.1–1.0)
Monocytes Relative: 7 %
Neutro Abs: 5.3 10*3/uL (ref 1.7–7.7)
Neutrophils Relative %: 70 %
Platelets: 307 10*3/uL (ref 150–400)
RBC: 3.47 MIL/uL — ABNORMAL LOW (ref 3.87–5.11)
RDW: 17.9 % — ABNORMAL HIGH (ref 11.5–15.5)
WBC: 7.4 10*3/uL (ref 4.0–10.5)
nRBC: 0.4 % — ABNORMAL HIGH (ref 0.0–0.2)

## 2018-10-09 LAB — COMPREHENSIVE METABOLIC PANEL
ALT: 9 U/L (ref 0–44)
AST: 26 U/L (ref 15–41)
Albumin: 3.1 g/dL — ABNORMAL LOW (ref 3.5–5.0)
Alkaline Phosphatase: 87 U/L (ref 38–126)
Anion gap: 7 (ref 5–15)
BUN: 20 mg/dL (ref 8–23)
CO2: 27 mmol/L (ref 22–32)
Calcium: 7.5 mg/dL — ABNORMAL LOW (ref 8.9–10.3)
Chloride: 104 mmol/L (ref 98–111)
Creatinine, Ser: 0.99 mg/dL (ref 0.44–1.00)
GFR calc Af Amer: >60 mL/min (ref >60)
GFR calc non Af Amer: 55 mL/min — ABNORMAL LOW (ref >60)
Glucose, Bld: 95 mg/dL (ref 70–99)
Potassium: 3.4 mmol/L — ABNORMAL LOW (ref 3.5–5.1)
Sodium: 138 mmol/L (ref 135–145)
Total Bilirubin: 0.7 mg/dL (ref 0.3–1.2)
Total Protein: 6.8 g/dL (ref 6.5–8.1)

## 2018-10-09 MED ORDER — FLUCONAZOLE 100 MG PO TABS: 100.0000 mg | ORAL_TABLET | Freq: Every day | ORAL | 3 refills | Status: DC

## 2018-10-09 MED ORDER — FAMOTIDINE 40 MG PO TABS: 40.0000 mg | ORAL_TABLET | Freq: Every day | ORAL | 3 refills | Status: DC

## 2018-10-09 MED ORDER — FLUCONAZOLE 100 MG PO TABS
100.0000 mg | ORAL_TABLET | Freq: Every day | ORAL | Status: DC
  Administered 2018-10-09: 100 mg via ORAL
  Filled 2018-10-09: qty 1

## 2018-10-09 MED ORDER — DEXAMETHASONE 4 MG PO TABS: 8.0000 mg | ORAL_TABLET | Freq: Every day | ORAL | 2 refills | Status: DC

## 2018-10-09 MED ORDER — TEMAZEPAM 7.5 MG PO CAPS: 7.5000 mg | ORAL_CAPSULE | Freq: Every evening | ORAL | 0 refills | Status: DC | PRN

## 2018-10-09 MED ORDER — IRBESARTAN 75 MG PO TABS: 75.0000 mg | ORAL_TABLET | Freq: Every day | ORAL | 3 refills | Status: DC

## 2018-10-09 MED ORDER — OXYCODONE HCL 5 MG PO TABS: ORAL_TABLET | ORAL | 0 refills | Status: DC

## 2018-10-09 MED ORDER — FENTANYL 12 MCG/HR TD PT72: 1.0000 | MEDICATED_PATCH | TRANSDERMAL | 0 refills | Status: DC

## 2018-10-09 MED ORDER — HEPARIN SOD (PORK) LOCK FLUSH 100 UNIT/ML IV SOLN
500.0000 [IU] | Freq: Once | INTRAVENOUS | Status: AC
  Administered 2018-10-09: 500 [IU] via INTRAVENOUS
  Filled 2018-10-09: qty 5

## 2018-10-09 MED ORDER — POLYETHYLENE GLYCOL 3350 17 G PO PACK: 17.0000 g | PACK | Freq: Every day | ORAL | 0 refills | Status: AC

## 2018-10-09 NOTE — Discharge Summary (Signed)
This is a discharge summary for [employer].  Her date of birth is 03-02-1941.  Her date of admission was 10/04/2018.  Dated discharge is 10/09/2018.  Her diagnosis on discharge is: #1 metastatic breast cancer with brain metastasis, progressive bone metastasis.  2.  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  Dehydration  4.  Weakness  Her condition on discharge is improved.  Her diet is regular as tolerated.  Her medications upon discharge are: #1 oxycodone 5-10 mg p.o. every 6 hours as needed pain  2.  Duragesic patch 12.5 mcg applied to the skin every 3 days 3.  Decadron 8 mg p.o. daily with food 4.  Pepcid 40 mg p.o. daily 5.  Gabapentin '300mg'$  p.o. 3 times a day. 6.  Diflucan 100 mg p.o. daily 7.  Letrozole 2.5 mg p.o. daily 8.  Avapro 75 mg p.o. daily 9.  MiraLAX 1 capful p.o. daily for constipation 10.  Probiotic-1 p.o. daily 11.  Restoril 7.5 mg p.o. nightly as needed sleep 12.  Vitamin D  5000 units po daily  Her follow-up is as follows:  1.  She will see Dr. Lisbeth Renshaw of radiation oncology at [facility] on Monday, April 20, for radiation treatments.  2.  She will see Dr. Marin Olp at the [place] [facility] center on Wednesday, April 22.  Hospital course:  Ms. Lover was admitted from the office.  She was admitted because of severe pain and weakness.  She had not been to the office for several months.  She had gone to the emergency room back in February when she thought she developed Bell's palsy.  No x-rays were done.  She had not gotten better.  Her pain has gotten significantly worse in her back.  She had abdominal pain.  She was dehydrated.  She was not eating.  We admitted her.  We went ahead and did scans on her.  She had a MRI of the brain which unfortunately showed new brain metastasis.  One metastasis was affecting her right cranial nerve VII.  She had other CNS metastasis.  She had a CT scan of the spine.  This showed severe  arthritic changes at L1 where she had a corpectomy.  We had an MRI of the lumbar spine done.  This confirmed the arthritis.  She had some nerve compression.  We did a CT scan of her abdomen pelvis.  She did have a new liver lesion.  She had a enlarging lung lesion.  He started on IV fluids.  She had renal insufficiency.  She improved quite nicely with IV fluids.  We gave her IV pain medication.  We started her on IV Dilaudid.  I then placed on a Duragesic patch.  This was helpful.  Her appetite improved.  She has not taken her letrozole for her breast cancer.  We got her back on the letrozole.  We did do a CA 27.29.  This was elevated at 155.  Her renal function improved quite nicely.   She has been hypercalcemia when she came in.  I thought this was probably from dehydration.  We did give her a dose of Zometa.  He had iron deficiency.  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  We did give her a dose of IV iron on 10/08/2018.  This worked very nicely.  On her discharge date, her labs show a sodium of 138.  Potassium 3.4.  BUN  was 20 creatinine 0.99.  Calcium 7.5 with an albumin of 3.1.  Her liver tests were normal.  Her white cell count was 7.4.  Hemoglobin 9.  Platelet count 307,000.  She did have a vitamin D level checked.  This was 38.  Her son, who lives in [place], was thinking about taking her back to [place] with him.  She lives by herself appear in [facility].  She has had been seen previously at the [facility] in [place].  She will clearly need a new treatment protocol.  Given that her tumor is HER-2 positive, I would probably treat her with Kadcyla now.  She has ER positive breast cancer.  I would consider Faslodex with Verzenio.  We cannot do these with her in the hospital.  I have had long talks with her.  She said that she wants to stay in [place] for the rest of the year and then maybe go down to [place].  Upon her discharge day, her physical exam  shows a temperature of 98.4.  Pulse 52.  Blood pressure 180/62.  Her weight is 147 pounds.  Head neck exam shows no ocular or oral lesions.  There are no palpable cervical or supraclavicular lymph nodes.  She does have the right cranial nerve deficit.  Her right eye is not erythematous with no conjunctival inflammation.  Lungs are clear bilaterally.  Cardiac exam regular rate and rhythm with no murmurs, rubs or bruits.  Abdomen is soft.  She has good bowel sounds.  There is no fluid wave.  There is no palpable liver or spleen tip.  Extremities shows no clubbing, cyanosis or edema.  She has decent range of motion of her joints.  Her strength is improved.  Neurological exam shows the right cranial nerve VII deficit.  We did start her on some Decadron.  This helped somewhat with her pain.  There is also I think help with any swelling from the CNS tumors.  This is a discharge summary for Brantley Persons, MD

## 2018-10-09 NOTE — Progress Notes (Signed)
Pts  Son came to take Michelle Bowen home.I reviewed the dc instructions with him. He  Verbalized understanding

## 2018-10-09 NOTE — Discharge Instructions (Signed)
Please call Dr. Marin Olp if you have any problems with pain, diarrhea/constipation, coughing, bleeding, or fever greater than 101 degrees.  You will have radiation with Dr. Lisbeth Renshaw at the [facility] center on Monday at 3 PM  You will see Dr. Marin Olp at his office at [facility] on Wednesday.  His office will call you for the time.

## 2018-10-11 ENCOUNTER — Telehealth: Payer: Self-pay | Admitting: *Deleted

## 2018-10-11 ENCOUNTER — Other Ambulatory Visit: Payer: Self-pay | Admitting: Hematology & Oncology

## 2018-10-11 ENCOUNTER — Ambulatory Visit: Payer: Medicare Other | Admitting: Radiation Oncology

## 2018-10-11 ENCOUNTER — Encounter: Payer: Self-pay | Admitting: Hematology & Oncology

## 2018-10-11 DIAGNOSIS — C50919 Malignant neoplasm of unspecified site of unspecified female breast: Secondary | ICD-10-CM

## 2018-10-11 DIAGNOSIS — C7931 Secondary malignant neoplasm of brain: Secondary | ICD-10-CM | POA: Insufficient documentation

## 2018-10-11 DIAGNOSIS — C787 Secondary malignant neoplasm of liver and intrahepatic bile duct: Principal | ICD-10-CM

## 2018-10-11 HISTORY — DX: Malignant neoplasm of unspecified site of unspecified female breast: C50.919

## 2018-10-11 NOTE — Progress Notes (Signed)
START OFF PATHWAY REGIMEN - Breast   [id number]:Ado-Trastuzumab Emtansine 3.6 mg/kg IV D1 q21 Days:   A cycle is every 21 days:     Ado-trastuzumab emtansine   **Always confirm dose/schedule in your pharmacy ordering system**  Patient Characteristics: Distant Metastases or Locoregional Recurrent Disease - Unresected or Locally Advanced Unresectable Disease Progressing after Neoadjuvant and Local Therapies, HER2 Positive, ER Positive, HER2-Targeted Therapy (Concurrent with Endocrine Therapy) Therapeutic Status: Distant Metastases BRCA Mutation Status: Did Not Order Test ER Status: Positive (+) HER2 Status: Positive (+) PR Status: Positive (+) Intent of Therapy: Non-Curative / Palliative Intent, Discussed with Patient

## 2018-10-11 NOTE — Telephone Encounter (Signed)
Requested radiology department to overnight by [employer] this month's CT & MRI's to [facility] to the attention of [facility]

## 2018-10-12 ENCOUNTER — Other Ambulatory Visit: Payer: Self-pay | Admitting: *Deleted

## 2018-10-12 ENCOUNTER — Ambulatory Visit: Payer: Medicare Other

## 2018-10-12 DIAGNOSIS — C50411 Malignant neoplasm of upper-outer quadrant of right female breast: Secondary | ICD-10-CM

## 2018-10-12 DIAGNOSIS — G629 Polyneuropathy, unspecified: Secondary | ICD-10-CM

## 2018-10-12 DIAGNOSIS — C50911 Malignant neoplasm of unspecified site of right female breast: Secondary | ICD-10-CM

## 2018-10-12 DIAGNOSIS — C7951 Secondary malignant neoplasm of bone: Secondary | ICD-10-CM

## 2018-10-12 DIAGNOSIS — C50919 Malignant neoplasm of unspecified site of unspecified female breast: Secondary | ICD-10-CM

## 2018-10-12 MED ORDER — IRBESARTAN 75 MG PO TABS: 75.0000 mg | ORAL_TABLET | Freq: Every day | ORAL | 0 refills | Status: AC

## 2018-10-12 MED ORDER — FLUCONAZOLE 100 MG PO TABS: 100.0000 mg | ORAL_TABLET | Freq: Every day | ORAL | 0 refills | Status: DC

## 2018-10-12 MED ORDER — DEXAMETHASONE 4 MG PO TABS: 8.0000 mg | ORAL_TABLET | Freq: Every day | ORAL | 0 refills | Status: DC

## 2018-10-12 MED ORDER — GABAPENTIN 300 MG PO CAPS: 300.0000 mg | ORAL_CAPSULE | Freq: Three times a day (TID) | ORAL | 0 refills | Status: DC

## 2018-10-12 NOTE — Telephone Encounter (Signed)
Patient's son calling the office stating that they travelled to [place] for a consult at [facility] and in the process forgot her medications. They are asking for a two week supply to be sent to a local pharmacy there to cover them while in [place].   Explained to the son, that we could sent non-controlled substances as requested, but that pharmacy may not pay for fill. Also explained that the controlled substances, just sent on 10/09/18, could not be sent. He understood. He states they have an appointment today, and will talk to the providers there as well.  2 week supply for four non-controlled meds sent to requested pharmacy.

## 2018-10-13 ENCOUNTER — Other Ambulatory Visit: Payer: Self-pay | Admitting: *Deleted

## 2018-10-13 ENCOUNTER — Ambulatory Visit
Admission: RE | Admit: 2018-10-13 | Discharge: 2018-10-13 | Disposition: A | Discharge status: Home / Self Care | Payer: Medicare Other | Source: Ambulatory Visit | Attending: Radiation Oncology | Admitting: Radiation Oncology

## 2018-10-13 ENCOUNTER — Ambulatory Visit: Payer: Medicare Other | Admitting: Hematology & Oncology

## 2018-10-13 ENCOUNTER — Other Ambulatory Visit: Payer: Self-pay

## 2018-10-13 ENCOUNTER — Telehealth: Payer: Self-pay | Admitting: Hematology & Oncology

## 2018-10-13 ENCOUNTER — Ambulatory Visit: Payer: Medicare Other

## 2018-10-13 DIAGNOSIS — C7951 Secondary malignant neoplasm of bone: Secondary | ICD-10-CM | POA: Diagnosis present

## 2018-10-13 DIAGNOSIS — Z510 Encounter for antineoplastic radiation therapy: Secondary | ICD-10-CM | POA: Diagnosis not present

## 2018-10-13 NOTE — Telephone Encounter (Signed)
Called and LMVM for patient with date/time per 4/22 sch msg

## 2018-10-14 ENCOUNTER — Other Ambulatory Visit: Payer: Self-pay

## 2018-10-14 ENCOUNTER — Ambulatory Visit
Admission: RE | Admit: 2018-10-14 | Discharge: 2018-10-14 | Disposition: A | Discharge status: Home / Self Care | Payer: Medicare Other | Source: Ambulatory Visit | Attending: Radiation Oncology | Admitting: Radiation Oncology

## 2018-10-14 ENCOUNTER — Ambulatory Visit: Payer: Medicare Other

## 2018-10-14 DIAGNOSIS — Z510 Encounter for antineoplastic radiation therapy: Secondary | ICD-10-CM | POA: Diagnosis not present

## 2018-10-15 ENCOUNTER — Inpatient Hospital Stay: Payer: Medicare Other

## 2018-10-15 ENCOUNTER — Ambulatory Visit: Payer: Medicare Other | Admitting: Hematology & Oncology

## 2018-10-15 ENCOUNTER — Other Ambulatory Visit: Payer: Self-pay

## 2018-10-15 ENCOUNTER — Ambulatory Visit: Payer: Medicare Other

## 2018-10-15 ENCOUNTER — Encounter: Payer: Self-pay | Admitting: Hematology & Oncology

## 2018-10-15 ENCOUNTER — Other Ambulatory Visit: Payer: Medicare Other

## 2018-10-15 ENCOUNTER — Inpatient Hospital Stay (HOSPITAL_BASED_OUTPATIENT_CLINIC_OR_DEPARTMENT_OTHER): Payer: Medicare Other | Admitting: Hematology & Oncology

## 2018-10-15 ENCOUNTER — Ambulatory Visit
Admission: RE | Admit: 2018-10-15 | Discharge: 2018-10-15 | Disposition: A | Discharge status: Home / Self Care | Payer: Medicare Other | Source: Ambulatory Visit | Attending: Radiation Oncology | Admitting: Radiation Oncology

## 2018-10-15 VITALS — BP 179/80 | HR 76 | Temp 98.8°F | Resp 18 | Wt 139.0 lb

## 2018-10-15 DIAGNOSIS — C50411 Malignant neoplasm of upper-outer quadrant of right female breast: Secondary | ICD-10-CM

## 2018-10-15 DIAGNOSIS — D509 Iron deficiency anemia, unspecified: Secondary | ICD-10-CM | POA: Diagnosis not present

## 2018-10-15 DIAGNOSIS — C50911 Malignant neoplasm of unspecified site of right female breast: Secondary | ICD-10-CM | POA: Diagnosis present

## 2018-10-15 DIAGNOSIS — C7951 Secondary malignant neoplasm of bone: Secondary | ICD-10-CM

## 2018-10-15 DIAGNOSIS — G8929 Other chronic pain: Secondary | ICD-10-CM | POA: Diagnosis not present

## 2018-10-15 DIAGNOSIS — C50919 Malignant neoplasm of unspecified site of unspecified female breast: Secondary | ICD-10-CM

## 2018-10-15 DIAGNOSIS — G510 Bell's palsy: Secondary | ICD-10-CM

## 2018-10-15 DIAGNOSIS — M549 Dorsalgia, unspecified: Secondary | ICD-10-CM | POA: Diagnosis not present

## 2018-10-15 DIAGNOSIS — C787 Secondary malignant neoplasm of liver and intrahepatic bile duct: Principal | ICD-10-CM

## 2018-10-15 DIAGNOSIS — Z170 Estrogen receptor positive status [ER+]: Secondary | ICD-10-CM | POA: Diagnosis not present

## 2018-10-15 DIAGNOSIS — Z5112 Encounter for antineoplastic immunotherapy: Secondary | ICD-10-CM | POA: Diagnosis present

## 2018-10-15 DIAGNOSIS — Z510 Encounter for antineoplastic radiation therapy: Secondary | ICD-10-CM | POA: Diagnosis not present

## 2018-10-15 LAB — CBC WITH DIFFERENTIAL (CANCER CENTER ONLY)
Abs Immature Granulocytes: 0.05 10*3/uL (ref 0.00–0.07)
Basophils Absolute: 0 10*3/uL (ref 0.0–0.1)
Basophils Relative: 0 %
Eosinophils Absolute: 0 10*3/uL (ref 0.0–0.5)
Eosinophils Relative: 0 %
HCT: 33.9 % — ABNORMAL LOW (ref 36.0–46.0)
Hemoglobin: 10.7 g/dL — ABNORMAL LOW (ref 12.0–15.0)
Immature Granulocytes: 1 %
Lymphocytes Relative: 6 %
Lymphs Abs: 0.5 10*3/uL — ABNORMAL LOW (ref 0.7–4.0)
MCH: 26.8 pg (ref 26.0–34.0)
MCHC: 31.6 g/dL (ref 30.0–36.0)
MCV: 85 fL (ref 80.0–100.0)
Monocytes Absolute: 0.5 10*3/uL (ref 0.1–1.0)
Monocytes Relative: 5 %
Neutro Abs: 7.8 10*3/uL — ABNORMAL HIGH (ref 1.7–7.7)
Neutrophils Relative %: 88 %
Platelet Count: 347 10*3/uL (ref 150–400)
RBC: 3.99 MIL/uL (ref 3.87–5.11)
RDW: 19.3 % — ABNORMAL HIGH (ref 11.5–15.5)
WBC Count: 8.8 10*3/uL (ref 4.0–10.5)
nRBC: 0 % (ref 0.0–0.2)

## 2018-10-15 LAB — CMP (CANCER CENTER ONLY)
ALT: 8 U/L (ref 0–44)
AST: 24 U/L (ref 15–41)
Albumin: 3.9 g/dL (ref 3.5–5.0)
Alkaline Phosphatase: 100 U/L (ref 38–126)
Anion gap: 10 (ref 5–15)
BUN: 40 mg/dL — ABNORMAL HIGH (ref 8–23)
CO2: 28 mmol/L (ref 22–32)
Calcium: 9.5 mg/dL (ref 8.9–10.3)
Chloride: 97 mmol/L — ABNORMAL LOW (ref 98–111)
Creatinine: 1.2 mg/dL — ABNORMAL HIGH (ref 0.44–1.00)
GFR, Est AFR Am: 51 mL/min — ABNORMAL LOW (ref >60)
GFR, Estimated: 44 mL/min — ABNORMAL LOW (ref >60)
Glucose, Bld: 120 mg/dL — ABNORMAL HIGH (ref 70–99)
Potassium: 3.9 mmol/L (ref 3.5–5.1)
Sodium: 135 mmol/L (ref 135–145)
Total Bilirubin: 0.9 mg/dL (ref 0.3–1.2)
Total Protein: 7.2 g/dL (ref 6.5–8.1)

## 2018-10-15 MED ORDER — ACETAMINOPHEN 325 MG PO TABS
ORAL_TABLET | ORAL | Status: AC
  Filled 2018-10-15: qty 2

## 2018-10-15 MED ORDER — SODIUM CHLORIDE 0.9 % IV SOLN
510.0000 mg | Freq: Once | INTRAVENOUS | Status: AC
  Administered 2018-10-15: 510 mg via INTRAVENOUS
  Filled 2018-10-15: qty 17

## 2018-10-15 MED ORDER — ACETAMINOPHEN 325 MG PO TABS
650.0000 mg | ORAL_TABLET | Freq: Once | ORAL | Status: AC
  Administered 2018-10-15: 650 mg via ORAL

## 2018-10-15 MED ORDER — FULVESTRANT 250 MG/5ML IM SOLN
500.0000 mg | INTRAMUSCULAR | Status: DC
  Administered 2018-10-15: 500 mg via INTRAMUSCULAR

## 2018-10-15 MED ORDER — DIPHENHYDRAMINE HCL 25 MG PO CAPS
50.0000 mg | ORAL_CAPSULE | Freq: Once | ORAL | Status: AC
  Administered 2018-10-15: 11:00:00 50 mg via ORAL

## 2018-10-15 MED ORDER — HYDROMORPHONE HCL 1 MG/ML IJ SOLN
INTRAMUSCULAR | Status: AC
  Filled 2018-10-15: qty 1

## 2018-10-15 MED ORDER — DENOSUMAB 120 MG/1.7ML ~~LOC~~ SOLN
SUBCUTANEOUS | Status: AC
  Filled 2018-10-15: qty 1.7

## 2018-10-15 MED ORDER — DENOSUMAB 120 MG/1.7ML ~~LOC~~ SOLN: 120.0000 mg | Freq: Once | SUBCUTANEOUS | Status: DC

## 2018-10-15 MED ORDER — SODIUM CHLORIDE 0.9 % IV SOLN
3.6000 mg/kg | Freq: Once | INTRAVENOUS | Status: AC
  Administered 2018-10-15: 240 mg via INTRAVENOUS
  Filled 2018-10-15: qty 5

## 2018-10-15 MED ORDER — ABEMACICLIB 100 MG PO TABS: 100.0000 mg | ORAL_TABLET | Freq: Two times a day (BID) | ORAL | 6 refills | Status: DC

## 2018-10-15 MED ORDER — SODIUM CHLORIDE 0.9% FLUSH
10.0000 mL | INTRAVENOUS | Status: DC | PRN
  Administered 2018-10-15: 10 mL
  Filled 2018-10-15: qty 10

## 2018-10-15 MED ORDER — SODIUM CHLORIDE 0.9 % IV SOLN
Freq: Once | INTRAVENOUS | Status: AC
  Administered 2018-10-15: 11:00:00 via INTRAVENOUS
  Filled 2018-10-15: qty 250

## 2018-10-15 MED ORDER — HYDROMORPHONE HCL 1 MG/ML IJ SOLN
1.0000 mg | Freq: Once | INTRAMUSCULAR | Status: AC
  Administered 2018-10-15: 1 mg via INTRAVENOUS

## 2018-10-15 MED ORDER — FULVESTRANT 250 MG/5ML IM SOLN
INTRAMUSCULAR | Status: AC
  Filled 2018-10-15: qty 5

## 2018-10-15 MED ORDER — HEPARIN SOD (PORK) LOCK FLUSH 100 UNIT/ML IV SOLN
500.0000 [IU] | Freq: Once | INTRAVENOUS | Status: AC | PRN
  Administered 2018-10-15: 500 [IU]
  Filled 2018-10-15: qty 5

## 2018-10-15 MED ORDER — SODIUM CHLORIDE 0.9 % IV SOLN
Freq: Once | INTRAVENOUS | Status: DC
  Filled 2018-10-15: qty 250

## 2018-10-15 MED ORDER — DIPHENHYDRAMINE HCL 25 MG PO CAPS
ORAL_CAPSULE | ORAL | Status: AC
  Filled 2018-10-15: qty 2

## 2018-10-15 NOTE — Patient Instructions (Addendum)
Ferumoxytol injection What is this medicine? FERUMOXYTOL is an iron complex. Iron is used to make healthy red blood cells, which carry oxygen and nutrients throughout the body. This medicine is used to treat iron deficiency anemia. This medicine may be used for other purposes; ask your health care provider or pharmacist if you have questions. COMMON BRAND NAME(S): Feraheme What should I tell my health care provider before I take this medicine? They need to know if you have any of these conditions: -anemia not caused by low iron levels -high levels of iron in the blood -magnetic resonance imaging (MRI) test scheduled -an unusual or allergic reaction to iron, other medicines, foods, dyes, or preservatives -pregnant or trying to get pregnant -breast-feeding How should I use this medicine? This medicine is for injection into a vein. It is given by a health care professional in a hospital or clinic setting. Talk to your pediatrician regarding the use of this medicine in children. Special care may be needed. Overdosage: If you think you have taken too much of this medicine contact a poison control center or emergency room at once. NOTE: This medicine is only for you. Do not share this medicine with others. What if I miss a dose? It is important not to miss your dose. Call your doctor or health care professional if you are unable to keep an appointment. What may interact with this medicine? This medicine may interact with the following medications: -other iron products This list may not describe all possible interactions. Give your health care provider a list of all the medicines, herbs, non-prescription drugs, or dietary supplements you use. Also tell them if you smoke, drink alcohol, or use illegal drugs. Some items may interact with your medicine. What should I watch for while using this medicine? Visit your doctor or healthcare professional regularly. Tell your doctor or healthcare professional  if your symptoms do not start to get better or if they get worse. You may need blood work done while you are taking this medicine. You may need to follow a special diet. Talk to your doctor. Foods that contain iron include: whole grains/cereals, dried fruits, beans, or peas, leafy green vegetables, and organ meats (liver, kidney). What side effects may I notice from receiving this medicine? Side effects that you should report to your doctor or health care professional as soon as possible: -allergic reactions like skin rash, itching or hives, swelling of the face, lips, or tongue -breathing problems -changes in blood pressure -feeling faint or lightheaded, falls -fever or chills -flushing, sweating, or hot feelings -swelling of the ankles or feet Side effects that usually do not require medical attention (report to your doctor or health care professional if they continue or are bothersome): -diarrhea -headache -nausea, vomiting -stomach pain This list may not describe all possible side effects. Call your doctor for medical advice about side effects. You may report side effects to FDA at 1-800-FDA-1088. Where should I keep my medicine? This drug is given in a hospital or clinic and will not be stored at home. NOTE: This sheet is a summary. It may not cover all possible information. If you have questions about this medicine, talk to your doctor, pharmacist, or health care provider.  2019 Elsevier/Gold Standard (2016-07-28 20:21:10) Acetaminophen tablets or caplets O que  este medicamento? O PARACETAMOL (acetaminofeno)  um analgsico  usado para tratar a dor leve e a febre. Este medicamento pode ser usado para outros propsitos; em caso de dvidas, pergunte ao seu profissional de  sade ou farmacutico. NOMES DE MARCAS COMUNS: Aceta, Actamin, Anacin Aspirin Free, Genapap, Genebs, Mapap, Pain & Fever, Pain and Fever, PAIN RELIEF, PAIN RELIEF Extra Strength, Pain Reliever, Panadol, PHARBETOL,  Q-Pap, Q-Pap Extra Strength, Tylenol, Tylenol CrushableTablet, Tylenol Extra Strength, XS No Aspirin, XS Pain Reliever O que devo dizer a meu profissional de sade antes de tomar este medicamento? Precisam saber se voc tem algum dos seguintes problemas ou estados de sade: -se voc bebe com frequncia -doenas hepticas -reao estranha ou alergia ao paracetamol (acetaminofeno) -reao estranha ou alergia a outros medicamentos -reao estranha ou alergia a alimentos, corantes ou conservantes -est grvida ou tentando engravidar -est amamentando Como devo usar este medicamento? Tome este medicamento por via oral com um copo d'gua. Siga as instrues na embalagem ou na bula. Tome este medicamento em intervalos regulares. No tome este medicamento com frequncia maior do que a indicada. Fale com seu pediatra a respeito do uso deste medicamento em crianas. Embora este medicamento possa ser receitado para crianas a [employer] 6 anos de idade para certas condies, algumas precaues so necessrias. Superdosagem: Se achar que tomou uma superdosagem deste medicamento, entre em contato imediatamente com o [facility] ou v a [employer]. OBSERVAO: Este medicamento  s para voc. No compartilhe este medicamento com outras pessoas. E se eu deixar de tomar uma dose? Se perder uma dose, tome-a assim que possvel. Se j estiver quase na hora da sua prxima dose, tome somente essa dose. No tome o remdio em dobro, nem tome uma dose adicional. O que pode interagir com este medicamento? -lcool -imatinibe -isoniazida -outros medicamentos que contenham paracetamol (acetaminofeno) Esta lista pode no descrever todas as interaes possveis. D ao seu profissional de sade uma lista de todos os medicamentos, ervas medicinais, remdios de venda livre, ou suplementos alimentares que voc [place]. Diga tambm se voc fuma, bebe, ou [place] drogas ilcitas. Alguns destes podem  interagir com o seu medicamento. Ao que devo ficar atento quando estiver [employer] medicamento? Avise seu mdico ou profissional de sade se a dor durar mais de 10 dias (5 dias em crianas), piorar, ou se surgir uma dor nova ou diferente. Voc tambm deve entrar em contato com seu mdico ou profissional de sade caso tenha uma febre que dure [place] de 3 dias. No tome paracetamol (acetaminofeno) (Tylenol) nem outros remdios que contenham paracetamol (acetaminofeno) enquanto estiver [employer]. Sempre leia o rtulo do medicamento com ateno. Se tiver dvidas, pergunte ao seu mdico ou farmacutico. Se voc tomar muito paracetamol, procure ajuda mdica imediatamente. O excesso de paracetamol pode ser [employer] perigoso e causar uma leso heptica. Mesmo se no apresentar sintomas,  importante obter ajuda imediatamente. Que efeitos colaterais posso sentir aps usar este medicamento? Efeitos colaterais que devem ser informados ao seu mdico ou profissional de sade o mais rpido possvel: -reaes alrgicas, como erupo na pele, coceira, urticria, ou inchao do rosto, dos lbios ou da lngua -dificuldade para respirar -febre ou dor de garganta -vermelhido, bolhas, descamao ou afrouxamento da pele, inclusive dentro da boca -dificuldade para urinar ou alterao na quantidade de urina -sangramentos ou hematomas fora do comum -fraqueza ou cansao fora do comum -olhos ou pele amarelados Efeitos colaterais que normalmente no precisam de cuidados mdicos (avise ao seu mdico ou profissional de sade se persistirem ou forem incmodos): -dor de cabea -enjoo -desarranjo estomacal Esta lista pode no descrever todos os efeitos colaterais possveis. Para mais orientaes sobre efeitos colaterais, consulte o seu mdico. Voc pode relatar a  ocorrncia de efeitos colaterais  FDA pelo telefone [phone]. Onde devo guardar meu medicamento? [facility] fora do [employer]. Conservar em  temperatura ambiente, entre 20 e 25 degreesC [phone] e 77 degreesF). [employer] [place]. Descartar qualquer medicamento no utilizado aps a data de validade impressa no rtulo ou embalagem. OBSERVAO: Este folheto  um resumo. Pode no cobrir todas as informaes possveis. Se tiver dvidas a respeito deste medicamento, fale com seu mdico, farmacutico ou profissional de sade.  2019 Elsevier/Gold Standard (2016-07-10 00:00:00) Diphenhydramine injection What is this medicine? DIPHENHYDRAMINE (dye fen HYE dra meen) is an antihistamine. It is used to treat the symptoms of an allergic reaction and motion sickness. It is also used to treat Parkinson's disease. This medicine may be used for other purposes; ask your health care provider or pharmacist if you have questions. COMMON BRAND NAME(S): Benadryl What should I tell my health care provider before I take this medicine? They need to know if you have any of these conditions: -asthma or lung disease -glaucoma -high blood pressure or heart disease -liver disease -pain or difficulty passing urine -prostate trouble -ulcers or other stomach problems -an unusual or allergic reaction to diphenhydramine, antihistamines, other medicines foods, dyes, or preservatives -pregnant or trying to get pregnant -breast-feeding How should I use this medicine? This medicine is for injection into a vein or a muscle. It is usually given by a health care professional in a hospital or clinic setting. If you get this medicine at home, you will be taught how to prepare and give this medicine. Use exactly as directed. Take your medicine at regular intervals. Do not take your medicine more often than directed. It is important that you put your used needles and syringes in a special sharps container. Do not put them in a trash can. If you do not have a sharps container, call your pharmacist or healthcare provider to get one. Talk to your pediatrician regarding  the use of this medicine in children. While this drug may be prescribed for selected conditions, precautions do apply. This medicine is not approved for use in newborns and premature babies. Patients over 77 years old may have a stronger reaction and need a smaller dose. Overdosage: If you think you have taken too much of this medicine contact a poison control center or emergency room at once. NOTE: This medicine is only for you. Do not share this medicine with others. What if I miss a dose? If you miss a dose, take it as soon as you can. If it is almost time for your next dose, take only that dose. Do not take double or extra doses. What may interact with this medicine? Do not take this medicine with any of the following medications: -MAOIs like Carbex, Eldepryl, Marplan, Nardil, and Parnate This medicine may also interact with the following medications: -alcohol -barbiturates, like phenobarbital -medicines for bladder spasm like oxybutynin, tolterodine -medicines for blood pressure -medicines for depression, anxiety, or psychotic disturbances -medicines for movement abnormalities or Parkinson's disease -medicines for sleep -other medicines for cold, cough or allergy -some medicines for the stomach like chlordiazepoxide, dicyclomine This list may not describe all possible interactions. Give your health care provider a list of all the medicines, herbs, non-prescription drugs, or dietary supplements you use. Also tell them if you smoke, drink alcohol, or use illegal drugs. Some items may interact with your medicine. What should I watch for while using this medicine? Your condition will be monitored carefully while you  are receiving this medicine. Tell your doctor or healthcare professional if your symptoms do not start to get better or if they get worse. You may get drowsy or dizzy. Do not drive, use machinery, or do anything that needs mental alertness until you know how this medicine affects  you. Do not stand or sit up quickly, especially if you are an older patient. This reduces the risk of dizzy or fainting spells. Alcohol may interfere with the effect of this medicine. Avoid alcoholic drinks. Your mouth may get dry. Chewing sugarless gum or sucking hard candy, and drinking plenty of water may help. Contact your doctor if the problem does not go away or is severe. What side effects may I notice from receiving this medicine? Side effects that you should report to your doctor or health care professional as soon as possible: -allergic reactions like skin rash, itching or hives, swelling of the face, lips, or tongue -breathing problems -changes in vision -chills -confused, agitated, nervous -irregular or fast heartbeat -low blood pressure -seizures -tremor -trouble passing urine -unusual bleeding or bruising -unusually weak or tired Side effects that usually do not require medical attention (report to your doctor or health care professional if they continue or are bothersome): -constipation, diarrhea -drowsy -headache -loss of appetite -stomach upset, vomiting -sweating -thick mucous This list may not describe all possible side effects. Call your doctor for medical advice about side effects. You may report side effects to FDA at 1-800-FDA-1088. Where should I keep my medicine? Keep out of the reach of children. If you are using this medicine at home, you will be instructed on how to store this medicine. Throw away any unused medicine after the expiration date on the label. NOTE: This sheet is a summary. It may not cover all possible information. If you have questions about this medicine, talk to your doctor, pharmacist, or health care provider.  2019 Elsevier/Gold Standard (2007-09-28 14:28:35)

## 2018-10-15 NOTE — Progress Notes (Signed)
Hematology and Oncology Follow Up Visit  Michelle Bowen [id number] [birth year] 78 y.o. 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  Is been a whirlwind for her.  We saw her back about 11 days ago.  We had to admit her because of dehydration, weakness, and pain.  She had not had treatment for about 4 months because of a scheduling problem.  In the hospital, she was seen by radiation oncology.  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  She has progressive disease.  She has disease in her lungs.  She has disease in her liver.  She has more bony disease.  She had a CA 27.29 of 155.  She is clearly in need of a new protocol.  She was supposed to go down to [place] in [place].  I am not sure exactly what happened down there but she wanted to come back up to [place].  Her son was supposed to keep her in [place] but his house is not ready for her yet.  A niece come down from [place].  She is staying with Michelle Bowen for at least 2 weeks.  I talked to Michelle Bowen while she was in the hospital.  I explained to her the changes that we had to make with her treatment.  I think she clearly needs to have Kadcyla.  I think this should be effective as she was doing well with the Herceptin.  Since her tumor is ER positive, we will switch her over to Faslodex with Verzenio.  The Verzenio dose will be 100 mg p.o. twice daily this because of Michelle Bowen's performance status.  She is having problems with pain.   We had her on a fentanyl patch in the hospital.  For some reason, she did not pick up the prescription at the pharmacy when she was discharged.  She is only taking oxycodone right now.  She also has iron deficiency.  She needed IV iron in the hospital.  This helped get her hemoglobin up a little bit.  We will give her another dose of IV iron in the office.    She is quite worried about not being able to be home.  I told her and her niece told her that she will be there for at least 2 weeks.    She is getting radiation therapy right now.  She just started this this week.  I told her that it will be at least a week before she starts to feel the effects of radiation with respect to improvement in her pain and in her overall neurological state.  She has a rolling walker.  She is eating a little bit better.   She is not having diarrhea.  She is having no fever issues.  There is no bleeding.  Medications:  Allergies as of 10/15/2018   No Known Allergies     Medication List       Accurate as  of October 15, 2018 11:35 AM. Always use your most recent med list.        dexamethasone 4 MG tablet Commonly known as:  DECADRON Take 2 tablets (8 mg total) by mouth daily.   famotidine 40 MG tablet Commonly known as:  PEPCID Take 1 tablet (40 mg total) by mouth at bedtime.   fentaNYL 12 MCG/HR Commonly known as:  [place] 1 patch onto the skin every 3 (three) days.   fluconazole 100 MG tablet Commonly known as:  DIFLUCAN Take 1 tablet (100 mg total) by mouth daily.   gabapentin 300 MG capsule Commonly known as:  NEURONTIN Take 1 capsule (300 mg total) by mouth 3 (three) times daily.   irbesartan 75 MG tablet Commonly known as:  AVAPRO Take 1 tablet (75 mg total) by mouth daily.   letrozole 2.5 MG tablet Commonly known as:  FEMARA Take 1 tablet (2.5 mg total) by mouth daily.   oxyCODONE 5 MG immediate release tablet Commonly known as:  Oxy IR/ROXICODONE Take 1-2, if needed,  every 6 hours for pain   polyethylene glycol 17 g packet Commonly known as:  MIRALAX / GLYCOLAX Take 17 g by mouth daily.   PROBIOTIC ACIDOPHILUS PO Take by mouth.   temazepam 7.5 MG capsule Commonly known as:  RESTORIL Take 1 capsule (7.5 mg total) by mouth at bedtime as needed for sleep.   vitamin B-12 500 MCG tablet Commonly known as:  CYANOCOBALAMIN Take 500 mcg by mouth daily.   Vitamin D3 125 MCG (5000 UT) Tabs Take by mouth.       Allergies: No Known Allergies  Past Medical History, Surgical history, Social history, and Family History were reviewed and updated.  Review of Systems: Review of Systems  Constitutional: Positive for malaise/fatigue and weight loss.  HENT: Negative.   Eyes: Positive for photophobia and redness.  Respiratory: Negative.   Cardiovascular: Negative.   Gastrointestinal: Positive for abdominal pain.  Genitourinary: Negative.   Musculoskeletal: Positive for back pain.  Skin: Negative.   Neurological: Positive for weakness.  Endo/Heme/Allergies: Negative.   Psychiatric/Behavioral: Negative.       Physical Exam:  weight is 139 lb (63 kg). Her oral temperature is 98.8 F (37.1 C). Her blood pressure is 179/80 (abnormal) and her pulse is 76. Her respiration is 18.   Wt Readings from Last 3 Encounters:  10/15/18 139 lb (63 kg)  10/09/18 147 lb 7.8 oz (66.9 kg)  10/04/18 142 lb (64.4 kg)    Physical Exam Vitals signs reviewed.  Constitutional:      Comments: Chronically ill-appearing African-American female.  She is in obvious distress with pain.  HENT:     Head: Normocephalic and atraumatic.  Eyes:     Pupils: Pupils are equal, round, and reactive to light.     Comments: Ocular exam shows ptosis of the right eye.  She has tearing of the right eye.  Left eye has good extraocular muscle movement.  Neck:     Musculoskeletal: Normal range of motion.  Cardiovascular:     Rate and Rhythm: Normal rate and regular rhythm.     Heart  sounds: Normal heart sounds.  Pulmonary:     Effort: Pulmonary effort is normal.     Breath sounds: Normal breath sounds.  Abdominal:     General: Bowel sounds are normal.     Palpations: Abdomen is soft.     Comments: Abdominal exam shows a soft abdomen.  There is no fluid wave.  There is no palpable liver or spleen tip.  She has no guarding or rebound tenderness.  Musculoskeletal: Normal range of motion.        General: No tenderness or deformity.     Comments: Back exam shows some tenderness to palpation in the thoracic and lumbar spine.  I cannot detect any obvious paravertebral muscle spasm.  She does some tenderness to palpation in the left rib cage.  Lymphadenopathy:     Cervical: No cervical adenopathy.  Skin:    General: Skin is warm and dry.     Findings: No erythema or rash.  Neurological:     Mental Status: She is alert and oriented to person, place, and time.  Psychiatric:        Behavior: Behavior normal.        Thought Content: Thought content normal.        Judgment: Judgment normal.      Lab Results  Component Value Date   WBC 8.8 10/15/2018   HGB 10.7 (L) 10/15/2018   HCT 33.9 (L) 10/15/2018   MCV 85.0 10/15/2018   PLT 347 10/15/2018   Lab Results  Component Value Date   FERRITIN 47 10/07/2018   IRON 28 10/07/2018   TIBC 305 10/07/2018   UIBC 277 10/07/2018   IRONPCTSAT 9 (L) 10/07/2018   Lab Results  Component Value Date   RBC 3.99 10/15/2018   No results found for: KPAFRELGTCHN, LAMBDASER, KAPLAMBRATIO No results found for: IGGSERUM, IGA, IGMSERUM No results found for: Kathrynn Ducking, MSPIKE, SPEI   Chemistry      Component Value Date/Time   NA 135 10/15/2018 0930   NA 147 (H) 06/11/2017 1345   NA 139 04/23/2017 1256   K 3.9 10/15/2018 0930   K 4.3 06/11/2017 1345   K 4.2 04/23/2017 1256   CL 97 (L) 10/15/2018 0930   CL 106 06/11/2017 1345   CO2 28 10/15/2018 0930   CO2 27 06/11/2017 1345    CO2 28 04/23/2017 1256   BUN 40 (H) 10/15/2018 0930   BUN 13 06/11/2017 1345   BUN 10.0 04/23/2017 1256   CREATININE 1.20 (H) 10/15/2018 0930   CREATININE 1.1 06/11/2017 1345   CREATININE 0.8 04/23/2017 1256      Component Value Date/Time   CALCIUM 9.5 10/15/2018 0930   CALCIUM 8.9 06/11/2017 1345   CALCIUM 8.7 04/23/2017 1256   ALKPHOS 100 10/15/2018 0930   ALKPHOS 85 (H) 06/11/2017 1345   ALKPHOS 104 04/23/2017 1256   AST 24 10/15/2018 0930   AST 15 04/23/2017 1256   ALT 8 10/15/2018 0930   ALT 17 06/11/2017 1345   ALT <6 04/23/2017 1256   BILITOT 0.9 10/15/2018 0930   BILITOT 0.59 04/23/2017 1256       Impression and Plan: Ms. Kasik is a very pleasant 78 yo African American female with metastatic breast cancer, triple positive.   She clearly has progressive disease.  We documented this in the hospital.  I really do think though that the changes to the Kadcyla and the Faslodex/Verzenio is going to be helpful.  We will be able to follow her CA 27.29.  I told her that she can pick up the fentanyl patches at the pharmacy.  We called the pharmacy and they were there when she was discharged from the hospital on April 18.  Hopefully, she will start to feel better once these treatments start working.  We spent about 45 minutes with her today.  This is incredibly complicated given all the issues that she has.  I talked to her niece on the cell phone.  We will get her back here in another 3 weeks.       Volanda Napoleon, MD 4/24/202011:35 AM

## 2018-10-16 ENCOUNTER — Other Ambulatory Visit: Payer: Self-pay | Admitting: Oncology

## 2018-10-16 LAB — CANCER ANTIGEN 27.29: CA 27.29: 200 U/mL — ABNORMAL HIGH (ref 0.0–38.6)

## 2018-10-16 NOTE — Progress Notes (Unsigned)
I was called today through the answering service by Michelle Bowen complaining of poorly controlled pain.  The patient started fentanyl yesterday as prescribed by Dr. Marin Olp.  She also has oxycodone she has been taking 2 tablets every 5 or 6 hours and she says the pain is not well controlled.  The patient also complains of severe pain when she moves her neck in a certain way.--Given her very complex situation I referred her to the emergency room for further evaluation.

## 2018-10-18 ENCOUNTER — Other Ambulatory Visit: Payer: Medicare Other

## 2018-10-18 ENCOUNTER — Ambulatory Visit: Payer: Medicare Other

## 2018-10-18 ENCOUNTER — Telehealth: Payer: Self-pay | Admitting: Pharmacist

## 2018-10-18 ENCOUNTER — Other Ambulatory Visit: Payer: Self-pay

## 2018-10-18 ENCOUNTER — Telehealth: Payer: Self-pay | Admitting: Pharmacy Technician

## 2018-10-18 ENCOUNTER — Ambulatory Visit
Admission: RE | Admit: 2018-10-18 | Discharge: 2018-10-18 | Disposition: A | Discharge status: Home / Self Care | Payer: Medicare Other | Source: Ambulatory Visit | Attending: Radiation Oncology | Admitting: Radiation Oncology

## 2018-10-18 ENCOUNTER — Telehealth: Payer: Self-pay | Admitting: *Deleted

## 2018-10-18 ENCOUNTER — Ambulatory Visit: Payer: Medicare Other | Admitting: Hematology & Oncology

## 2018-10-18 DIAGNOSIS — Z510 Encounter for antineoplastic radiation therapy: Secondary | ICD-10-CM | POA: Diagnosis not present

## 2018-10-18 LAB — FERRITIN: Ferritin: 1455 ng/mL — ABNORMAL HIGH (ref 11–307)

## 2018-10-18 LAB — IRON AND TIBC
Iron: 128 ug/dL (ref 41–142)
Saturation Ratios: 51 % (ref 21–57)
TIBC: 249 ug/dL (ref 236–444)
UIBC: 121 ug/dL (ref 120–384)

## 2018-10-18 NOTE — Telephone Encounter (Signed)
Oral Oncology Pharmacist Encounter  Received new prescription for Verzenio (abemaciclib) for the treatment of Metastatic breast cancer, triple positive, in conjunction with Kadcyla and Faslodex, planned duration until disease progression or unacceptable drug toxicity.  CBC/CMP from 10/15/2018 assessed, no relevant lab abnormalities. Prescription dose and frequency assessed.   Current medication list in Epic reviewed, one DDIs with abemaciclib identified: -Fluconazole: fluconazole may increase the serum concentration of abemaciclib. Ms. Shimer was only prescribed a 14 day course of fluconazole on 10/12/2018, not a longer term medication. No baseline dose adjustment needed if both medication are taking together, patient should be monitored for increased abemaciclib toxicities.   Verzenio was denied by the insurance due to the HER2 positive status of Ms. Michna's breast cancer. We will appeal this insurance decision.  Oral Oncology Clinic will continue to follow for insurance authorization, copayment issues, initial counseling and start date.  Darl Pikes, PharmD, BCPS, [facility] Hematology/Oncology Clinical Pharmacist [facility] [phone]  10/18/2018 2:43 PM

## 2018-10-18 NOTE — Telephone Encounter (Signed)
Oral Oncology Patient Advocate Encounter  Received notification from [employer] that prior authorization for Verzenio is required.  PA submitted on CoverMyMeds Key [id number] Status is pending  Oral Oncology Clinic will continue to follow.  [place] [facility] [place] Phone [phone] Fax [phone] 10/18/2018 12:48 PM

## 2018-10-18 NOTE — Telephone Encounter (Signed)
Call received from patient's niece, [name], stating that patient has had increased pain over the weekend and that she spoke with the on call service who told patient to go to the ED for pain control.  [name] states that she did not take patient to the ED d/t coronavirus concerns and that pt had recently had chemo.  Call placed back to patient's niece, [name] and she states that patient has been taking Oxycodone 10 mg every 4 hrs around the clock along with Fentanyl patch 12 mcg/hr without relief of pain to left temporal area.  She would also like to know if patient can stop taking Neurontin. Instructed [name] that I would speak with Dr. Marin Olp and call her back with orders.  Call placed back to [name] and informed [name] per order of Dr. Marin Olp to stop Neurontin, to double Fentanyl patch to 24 mcg/hr and to keep Oxycodone dose as previously ordered, 1-2 tablets every 6 hrs PRN.  [name] states that Fentanyl patch is due to be changed this evening and that she will place two 12 mcg/hr on patient tonight.  [name] appreciative of call back and has no further questions at this time.

## 2018-10-18 NOTE — Telephone Encounter (Signed)
Oral Oncology Patient Advocate Encounter  Received notification from [employer] that the request for prior authorization for Verzenio has been denied.  Reason for Denial:  The use of the medication in HER-2 positive patients is not medically necessary.    An appeal will be initiated and will continue to update this encounter until final determination.      [facility] Patient Advocate [facility] Phone [phone] Fax [phone] 10/18/2018 1:08 PM    

## 2018-10-19 ENCOUNTER — Other Ambulatory Visit: Payer: Medicare Other

## 2018-10-19 ENCOUNTER — Ambulatory Visit: Payer: Medicare Other

## 2018-10-19 ENCOUNTER — Ambulatory Visit
Admission: RE | Admit: 2018-10-19 | Discharge: 2018-10-19 | Disposition: A | Discharge status: Home / Self Care | Payer: Medicare Other | Source: Ambulatory Visit | Attending: Radiation Oncology | Admitting: Radiation Oncology

## 2018-10-19 ENCOUNTER — Ambulatory Visit: Payer: Medicare Other | Admitting: Hematology & Oncology

## 2018-10-19 ENCOUNTER — Other Ambulatory Visit: Payer: Self-pay

## 2018-10-19 DIAGNOSIS — Z510 Encounter for antineoplastic radiation therapy: Secondary | ICD-10-CM | POA: Diagnosis not present

## 2018-10-20 ENCOUNTER — Ambulatory Visit: Payer: Medicare Other

## 2018-10-20 ENCOUNTER — Other Ambulatory Visit: Payer: Self-pay

## 2018-10-20 ENCOUNTER — Ambulatory Visit
Admission: RE | Admit: 2018-10-20 | Discharge: 2018-10-20 | Disposition: A | Discharge status: Home / Self Care | Payer: Medicare Other | Source: Ambulatory Visit | Attending: Radiation Oncology | Admitting: Radiation Oncology

## 2018-10-20 DIAGNOSIS — Z510 Encounter for antineoplastic radiation therapy: Secondary | ICD-10-CM | POA: Diagnosis not present

## 2018-10-20 NOTE — Telephone Encounter (Signed)
Oral Chemotherapy Pharmacist Encounter   Faxed Verzenio appeal letter to [employer] program PA Approval Clinical Services.   Tele: [phone]  We will continue to follow Verzenio medication access.   Darl Pikes, PharmD, BCPS, [facility] Hematology/Oncology Clinical Pharmacist [facility] [place] [facility] [phone]  10/20/2018 9:34 AM

## 2018-10-21 ENCOUNTER — Ambulatory Visit
Admission: RE | Admit: 2018-10-21 | Discharge: 2018-10-21 | Disposition: A | Discharge status: Home / Self Care | Payer: Medicare Other | Source: Ambulatory Visit | Attending: Radiation Oncology | Admitting: Radiation Oncology

## 2018-10-21 ENCOUNTER — Telehealth: Payer: Self-pay | Admitting: *Deleted

## 2018-10-21 ENCOUNTER — Ambulatory Visit: Payer: Medicare Other

## 2018-10-21 DIAGNOSIS — Z510 Encounter for antineoplastic radiation therapy: Secondary | ICD-10-CM | POA: Diagnosis not present

## 2018-10-21 DIAGNOSIS — Z170 Estrogen receptor positive status [ER+]: Principal | ICD-10-CM

## 2018-10-21 DIAGNOSIS — C7951 Secondary malignant neoplasm of bone: Secondary | ICD-10-CM

## 2018-10-21 DIAGNOSIS — C50411 Malignant neoplasm of upper-outer quadrant of right female breast: Secondary | ICD-10-CM

## 2018-10-21 MED ORDER — OXYCODONE HCL 5 MG PO TABS: ORAL_TABLET | ORAL | 0 refills | Status: DC

## 2018-10-21 NOTE — Telephone Encounter (Signed)
Oral Chemotherapy Pharmacist Encounter   [employer] Employee program requiring a signed appeal authorization for the [employer] appeal. Signed authorization was faxed to the program today.  Darl Pikes, PharmD, BCPS, [facility] Hematology/Oncology Clinical Pharmacist [facility] [place] [facility] [phone]  10/21/2018 11:32 AM

## 2018-10-21 NOTE — Progress Notes (Signed)
  Radiation Oncology         [phone] ________________________________  Name: Michelle Bowen MRN: [id number]  Date: 10/07/2018  DOB: Dec 18, 1940  SIMULATION AND TREATMENT PLANNING NOTE  DIAGNOSIS:     ICD-10-CM   1. Bone metastases ([place]) C79.51      Site:   1.  Whole brain 2.  Left upper ribs 3. T8 spine  NARRATIVE:  The patient was brought to the [facility].  Identity was confirmed.  All relevant records and images related to the planned course of therapy were reviewed.   Written consent to proceed with treatment was confirmed which was freely given after reviewing the details related to the planned course of therapy had been reviewed with the patient.  Then, the patient was set-up in a stable reproducible  supine position for radiation therapy.  CT images were obtained.  Surface markings were placed.    Medically necessary complex treatment device(s) for immobilization:  accuform device.   The CT images were loaded into the planning software.  Then the target and avoidance structures were contoured.  Treatment planning then occurred.  The radiation prescription was entered and confirmed.  A total of 10 complex treatment devices were fabricated which relate to the designed radiation treatment fields in total for the 3 separate treatment areas. Each of these customized fields/ complex treatment devices will be used on a daily basis during the radiation course. I have requested : Isodose Plan.   PLAN:  The patient will receive 30 Gy in 10 fractions.  ________________________________   Jodelle Gross, MD, PhD

## 2018-10-21 NOTE — Telephone Encounter (Signed)
Message received from patient's niece, [name] stating that patient is currently taking Oxycodone 10 mg every 6 hrs around the clock and two Fentanyl 12 mcg patches every 72 hrs and that pain is not under control during the day and would like to know what to do for patient's pain control.  Dr. Marin Olp notified and order received for patient to continue Fentanyl patch as ordered and to increase Oxycodone to 10 mg every four hours as needed.  Call placed back to patient's niece to inform her of MD orders.  Patient's niece appreciative of call back and has no further questions at this time.

## 2018-10-22 ENCOUNTER — Ambulatory Visit: Payer: Medicare Other

## 2018-10-22 ENCOUNTER — Other Ambulatory Visit: Payer: Self-pay

## 2018-10-22 ENCOUNTER — Ambulatory Visit
Admission: RE | Admit: 2018-10-22 | Discharge: 2018-10-22 | Disposition: A | Discharge status: Home / Self Care | Payer: Medicare Other | Source: Ambulatory Visit | Attending: Radiation Oncology | Admitting: Radiation Oncology

## 2018-10-22 DIAGNOSIS — Z510 Encounter for antineoplastic radiation therapy: Secondary | ICD-10-CM | POA: Diagnosis not present

## 2018-10-22 DIAGNOSIS — C7951 Secondary malignant neoplasm of bone: Secondary | ICD-10-CM | POA: Insufficient documentation

## 2018-10-25 ENCOUNTER — Ambulatory Visit: Payer: Medicare Other

## 2018-10-25 ENCOUNTER — Other Ambulatory Visit: Payer: Self-pay | Admitting: *Deleted

## 2018-10-25 DIAGNOSIS — C7951 Secondary malignant neoplasm of bone: Principal | ICD-10-CM

## 2018-10-25 DIAGNOSIS — C50919 Malignant neoplasm of unspecified site of unspecified female breast: Secondary | ICD-10-CM

## 2018-10-25 MED ORDER — FENTANYL 12 MCG/HR TD PT72: 2.0000 | MEDICATED_PATCH | TRANSDERMAL | 0 refills | Status: DC

## 2018-10-25 NOTE — Telephone Encounter (Signed)
Called to check status of appeal.  Representative states that it was received by the external reviewer on 5/1 and is still being processed.  They will be in contact in the next few days with further information.  A peer-to-peer with the physician may be requested.

## 2018-10-26 ENCOUNTER — Ambulatory Visit: Payer: Medicare Other

## 2018-10-26 ENCOUNTER — Other Ambulatory Visit: Payer: Self-pay | Admitting: Radiation Therapy

## 2018-10-26 ENCOUNTER — Telehealth: Payer: Self-pay | Admitting: Hematology & Oncology

## 2018-10-26 ENCOUNTER — Telehealth: Payer: Self-pay | Admitting: Radiation Oncology

## 2018-10-26 DIAGNOSIS — C7949 Secondary malignant neoplasm of other parts of nervous system: Principal | ICD-10-CM

## 2018-10-26 DIAGNOSIS — C7931 Secondary malignant neoplasm of brain: Secondary | ICD-10-CM

## 2018-10-26 NOTE — Telephone Encounter (Signed)
I called to let the patient know she should complete her last two treatments. I called her son, and he said his mother is not answering her phone and he thinks she's given up. His cousin is staying with Michelle Bowen and I've tried calling her ([name]) so I can at least speak with the patient. Unfortunately I had to leave a message asking her to call me back so I can speak with the patient.   She returned my call and after discussing with her aunt who would not come to the phone, the patient would like to cancel her appointments for treatment. [name] and Nathaneil Canary will reach out to Dr. Marin Olp as well to discuss hospice referral.

## 2018-10-26 NOTE — Telephone Encounter (Signed)
Called and LMVM for patient regarding appointments for 5/15 being rescheduled due to provider being out for [facility]

## 2018-10-27 ENCOUNTER — Telehealth: Payer: Self-pay | Admitting: *Deleted

## 2018-10-27 ENCOUNTER — Encounter: Payer: Self-pay | Admitting: Radiation Oncology

## 2018-10-27 ENCOUNTER — Emergency Department (HOSPITAL_COMMUNITY): Payer: Medicare Other

## 2018-10-27 ENCOUNTER — Encounter (HOSPITAL_COMMUNITY): Payer: Self-pay

## 2018-10-27 ENCOUNTER — Ambulatory Visit: Payer: Medicare Other

## 2018-10-27 ENCOUNTER — Telehealth: Payer: Self-pay | Admitting: Hematology & Oncology

## 2018-10-27 ENCOUNTER — Other Ambulatory Visit: Payer: Self-pay

## 2018-10-27 ENCOUNTER — Telehealth: Payer: Self-pay | Admitting: Pharmacist

## 2018-10-27 ENCOUNTER — Inpatient Hospital Stay (HOSPITAL_COMMUNITY)
Admission: EM | Admit: 2018-10-27 | Discharge: 2018-10-30 | DRG: 054 | Disposition: A | Discharge status: Hospice | Payer: Medicare Other | Attending: Internal Medicine | Admitting: Internal Medicine

## 2018-10-27 DIAGNOSIS — N179 Acute kidney failure, unspecified: Secondary | ICD-10-CM | POA: Diagnosis present

## 2018-10-27 DIAGNOSIS — R402222 Coma scale, best verbal response, incomprehensible words, at arrival to emergency department: Secondary | ICD-10-CM | POA: Diagnosis present

## 2018-10-27 DIAGNOSIS — R627 Adult failure to thrive: Secondary | ICD-10-CM | POA: Insufficient documentation

## 2018-10-27 DIAGNOSIS — G510 Bell's palsy: Secondary | ICD-10-CM | POA: Diagnosis present

## 2018-10-27 DIAGNOSIS — Z1159 Encounter for screening for other viral diseases: Secondary | ICD-10-CM

## 2018-10-27 DIAGNOSIS — E876 Hypokalemia: Secondary | ICD-10-CM | POA: Diagnosis present

## 2018-10-27 DIAGNOSIS — Z515 Encounter for palliative care: Secondary | ICD-10-CM | POA: Diagnosis not present

## 2018-10-27 DIAGNOSIS — C50919 Malignant neoplasm of unspecified site of unspecified female breast: Secondary | ICD-10-CM | POA: Diagnosis not present

## 2018-10-27 DIAGNOSIS — C787 Secondary malignant neoplasm of liver and intrahepatic bile duct: Secondary | ICD-10-CM | POA: Diagnosis not present

## 2018-10-27 DIAGNOSIS — E860 Dehydration: Secondary | ICD-10-CM | POA: Diagnosis present

## 2018-10-27 DIAGNOSIS — D696 Thrombocytopenia, unspecified: Secondary | ICD-10-CM | POA: Diagnosis present

## 2018-10-27 DIAGNOSIS — I160 Hypertensive urgency: Secondary | ICD-10-CM | POA: Diagnosis present

## 2018-10-27 DIAGNOSIS — N390 Urinary tract infection, site not specified: Secondary | ICD-10-CM | POA: Diagnosis present

## 2018-10-27 DIAGNOSIS — I10 Essential (primary) hypertension: Secondary | ICD-10-CM | POA: Diagnosis present

## 2018-10-27 DIAGNOSIS — G936 Cerebral edema: Secondary | ICD-10-CM | POA: Diagnosis present

## 2018-10-27 DIAGNOSIS — D509 Iron deficiency anemia, unspecified: Secondary | ICD-10-CM | POA: Diagnosis present

## 2018-10-27 DIAGNOSIS — R402352 Coma scale, best motor response, localizes pain, at arrival to emergency department: Secondary | ICD-10-CM | POA: Diagnosis present

## 2018-10-27 DIAGNOSIS — C7931 Secondary malignant neoplasm of brain: Principal | ICD-10-CM | POA: Diagnosis present

## 2018-10-27 DIAGNOSIS — Z803 Family history of malignant neoplasm of breast: Secondary | ICD-10-CM | POA: Diagnosis not present

## 2018-10-27 DIAGNOSIS — B964 Proteus (mirabilis) (morganii) as the cause of diseases classified elsewhere: Secondary | ICD-10-CM | POA: Diagnosis present

## 2018-10-27 DIAGNOSIS — D500 Iron deficiency anemia secondary to blood loss (chronic): Secondary | ICD-10-CM

## 2018-10-27 DIAGNOSIS — D61810 Antineoplastic chemotherapy induced pancytopenia: Secondary | ICD-10-CM | POA: Diagnosis present

## 2018-10-27 DIAGNOSIS — Z853 Personal history of malignant neoplasm of breast: Secondary | ICD-10-CM | POA: Diagnosis not present

## 2018-10-27 DIAGNOSIS — Z87891 Personal history of nicotine dependence: Secondary | ICD-10-CM

## 2018-10-27 DIAGNOSIS — C7951 Secondary malignant neoplasm of bone: Secondary | ICD-10-CM | POA: Diagnosis present

## 2018-10-27 DIAGNOSIS — R4182 Altered mental status, unspecified: Secondary | ICD-10-CM | POA: Diagnosis not present

## 2018-10-27 DIAGNOSIS — R402122 Coma scale, eyes open, to pain, at arrival to emergency department: Secondary | ICD-10-CM | POA: Diagnosis present

## 2018-10-27 DIAGNOSIS — Z66 Do not resuscitate: Secondary | ICD-10-CM | POA: Diagnosis present

## 2018-10-27 DIAGNOSIS — Z923 Personal history of irradiation: Secondary | ICD-10-CM | POA: Diagnosis not present

## 2018-10-27 DIAGNOSIS — Z7952 Long term (current) use of systemic steroids: Secondary | ICD-10-CM

## 2018-10-27 DIAGNOSIS — Z79899 Other long term (current) drug therapy: Secondary | ICD-10-CM

## 2018-10-27 DIAGNOSIS — Z6824 Body mass index (BMI) 24.0-24.9, adult: Secondary | ICD-10-CM

## 2018-10-27 DIAGNOSIS — R410 Disorientation, unspecified: Secondary | ICD-10-CM | POA: Diagnosis present

## 2018-10-27 LAB — CBC WITH DIFFERENTIAL/PLATELET
Abs Immature Granulocytes: 0.02 10*3/uL (ref 0.00–0.07)
Basophils Absolute: 0 10*3/uL (ref 0.0–0.1)
Basophils Relative: 0 %
Eosinophils Absolute: 0 10*3/uL (ref 0.0–0.5)
Eosinophils Relative: 0 %
HCT: 43.3 % (ref 36.0–46.0)
Hemoglobin: 13.3 g/dL (ref 12.0–15.0)
Immature Granulocytes: 1 %
Lymphocytes Relative: 2 %
Lymphs Abs: 0.1 10*3/uL — ABNORMAL LOW (ref 0.7–4.0)
MCH: 27.5 pg (ref 26.0–34.0)
MCHC: 30.7 g/dL (ref 30.0–36.0)
MCV: 89.5 fL (ref 80.0–100.0)
Monocytes Absolute: 0.3 10*3/uL (ref 0.1–1.0)
Monocytes Relative: 6 %
Neutro Abs: 3.9 10*3/uL (ref 1.7–7.7)
Neutrophils Relative %: 91 %
Platelets: 66 10*3/uL — ABNORMAL LOW (ref 150–400)
RBC: 4.84 MIL/uL (ref 3.87–5.11)
RDW: 20.6 % — ABNORMAL HIGH (ref 11.5–15.5)
WBC: 4.3 10*3/uL (ref 4.0–10.5)
nRBC: 0 % (ref 0.0–0.2)

## 2018-10-27 LAB — URINALYSIS, ROUTINE W REFLEX MICROSCOPIC
Bacteria, UA: NONE SEEN
Bilirubin Urine: NEGATIVE
Glucose, UA: NEGATIVE mg/dL
Hgb urine dipstick: NEGATIVE
Ketones, ur: 20 mg/dL — AB
Leukocytes,Ua: NEGATIVE
Nitrite: NEGATIVE
Protein, ur: 30 mg/dL — AB
Specific Gravity, Urine: 1.019 (ref 1.005–1.030)
pH: 7 (ref 5.0–8.0)

## 2018-10-27 LAB — COMPREHENSIVE METABOLIC PANEL
ALT: 27 U/L (ref 0–44)
AST: 49 U/L — ABNORMAL HIGH (ref 15–41)
Albumin: 3.3 g/dL — ABNORMAL LOW (ref 3.5–5.0)
Alkaline Phosphatase: 158 U/L — ABNORMAL HIGH (ref 38–126)
Anion gap: 13 (ref 5–15)
BUN: 36 mg/dL — ABNORMAL HIGH (ref 8–23)
CO2: 25 mmol/L (ref 22–32)
Calcium: 8.5 mg/dL — ABNORMAL LOW (ref 8.9–10.3)
Chloride: 104 mmol/L (ref 98–111)
Creatinine, Ser: 1.25 mg/dL — ABNORMAL HIGH (ref 0.44–1.00)
GFR calc Af Amer: 48 mL/min — ABNORMAL LOW (ref >60)
GFR calc non Af Amer: 42 mL/min — ABNORMAL LOW (ref >60)
Glucose, Bld: 100 mg/dL — ABNORMAL HIGH (ref 70–99)
Potassium: 4.2 mmol/L (ref 3.5–5.1)
Sodium: 142 mmol/L (ref 135–145)
Total Bilirubin: 1.7 mg/dL — ABNORMAL HIGH (ref 0.3–1.2)
Total Protein: 7.4 g/dL (ref 6.5–8.1)

## 2018-10-27 LAB — SARS CORONAVIRUS 2 BY RT PCR (HOSPITAL ORDER, PERFORMED IN ~~LOC~~ HOSPITAL LAB): SARS Coronavirus 2: NEGATIVE

## 2018-10-27 LAB — TSH: TSH: 1.037 u[IU]/mL (ref 0.350–4.500)

## 2018-10-27 LAB — LACTIC ACID, PLASMA
Lactic Acid, Venous: 1.7 mmol/L (ref 0.5–1.9)
Lactic Acid, Venous: 1.2 mmol/L (ref 0.5–1.9)

## 2018-10-27 LAB — CBG MONITORING, ED
Glucose-Capillary: 96 mg/dL (ref 70–99)
Glucose-Capillary: 90 mg/dL (ref 70–99)

## 2018-10-27 MED ORDER — LETROZOLE 2.5 MG PO TABS: 2.5000 mg | ORAL_TABLET | Freq: Every day | ORAL | Status: DC

## 2018-10-27 MED ORDER — SODIUM CHLORIDE 0.9% FLUSH
10.0000 mL | INTRAVENOUS | Status: DC | PRN
  Administered 2018-10-30: 10 mL
  Filled 2018-10-27: qty 40

## 2018-10-27 MED ORDER — FAMOTIDINE 20 MG PO TABS: 40.0000 mg | ORAL_TABLET | Freq: Every day | ORAL | Status: DC

## 2018-10-27 MED ORDER — DEXAMETHASONE SODIUM PHOSPHATE 4 MG/ML IJ SOLN: 4.0000 mg | Freq: Three times a day (TID) | INTRAMUSCULAR | Status: DC

## 2018-10-27 MED ORDER — ORAL CARE MOUTH RINSE
15.0000 mL | Freq: Two times a day (BID) | OROMUCOSAL | Status: DC
  Administered 2018-10-28 – 2018-10-30 (×4): 15 mL via OROMUCOSAL

## 2018-10-27 MED ORDER — SODIUM CHLORIDE 0.9 % IV BOLUS
1000.0000 mL | Freq: Once | INTRAVENOUS | Status: AC
  Administered 2018-10-27: 1000 mL via INTRAVENOUS

## 2018-10-27 MED ORDER — FENTANYL 12 MCG/HR TD PT72: 2.0000 | MEDICATED_PATCH | TRANSDERMAL | Status: DC

## 2018-10-27 MED ORDER — SODIUM CHLORIDE 0.9 % IV SOLN
20.0000 mg | Freq: Once | INTRAVENOUS | Status: AC
  Administered 2018-10-27: 22:00:00 20 mg via INTRAVENOUS
  Filled 2018-10-27 (×2): qty 2

## 2018-10-27 MED ORDER — OXYCODONE HCL 5 MG PO TABS: 5.0000 mg | ORAL_TABLET | Freq: Four times a day (QID) | ORAL | Status: DC | PRN

## 2018-10-27 MED ORDER — HEPARIN SOD (PORK) LOCK FLUSH 100 UNIT/ML IV SOLN
500.0000 [IU] | Freq: Once | INTRAVENOUS | Status: DC | PRN
  Filled 2018-10-27: qty 5

## 2018-10-27 MED ORDER — FLUCONAZOLE 100 MG PO TABS: 100.0000 mg | ORAL_TABLET | Freq: Every day | ORAL | Status: DC

## 2018-10-27 MED ORDER — DEXAMETHASONE 4 MG PO TABS: 4.0000 mg | ORAL_TABLET | Freq: Three times a day (TID) | ORAL | Status: DC

## 2018-10-27 MED ORDER — POLYETHYLENE GLYCOL 3350 17 G PO PACK
17.0000 g | PACK | Freq: Every day | ORAL | Status: DC
  Filled 2018-10-27 (×2): qty 1

## 2018-10-27 MED ORDER — MORPHINE SULFATE (PF) 2 MG/ML IV SOLN: 2.0000 mg | INTRAVENOUS | Status: DC | PRN

## 2018-10-27 MED ORDER — DEXAMETHASONE SODIUM PHOSPHATE 4 MG/ML IJ SOLN: 20.0000 mg | Freq: Once | INTRAMUSCULAR | Status: DC

## 2018-10-27 MED ORDER — SODIUM CHLORIDE 0.9 % IV SOLN
INTRAVENOUS | Status: DC
  Administered 2018-10-27: 13:00:00 via INTRAVENOUS

## 2018-10-27 MED ORDER — LABETALOL HCL 5 MG/ML IV SOLN
10.0000 mg | INTRAVENOUS | Status: DC | PRN
  Administered 2018-10-27 – 2018-10-29 (×6): 10 mg via INTRAVENOUS
  Filled 2018-10-27 (×10): qty 4

## 2018-10-27 NOTE — Addendum Note (Signed)
Addended by: Darl Pikes on: 10/27/2018 10:02 AM   Modules accepted: Orders

## 2018-10-27 NOTE — Telephone Encounter (Signed)
Oral Chemotherapy Pharmacist Encounter   Patient has decided to proceed with hospice.   Darl Pikes, PharmD, BCPS, [facility] Hematology/Oncology Clinical Pharmacist [facility] [place] [facility] [phone]  10/27/2018 10:03 AM

## 2018-10-27 NOTE — ED Provider Notes (Signed)
78 yo F with a chief complaint of failure to thrive.  Patient has a history of breast cancer has decided over the past week that she no longer wants to live.  Decreased oral intake.  I received patient in signout from Dr. Zenia Resides plan is for admission post CT of the head.  The CT scan of the head shows may be some mild increased edema in the left frontal region.  I discussed the case with Dr. Lisbeth Renshaw he is on call for interventional radiology who actually takes care of Michelle Bowen.  He thought this may be due to her missing some sessions, felt it reasonable to increase her steroids currently and felt admission to the hospital was likely a good plan with her decreased mental status and decreased oral intake.  Will discuss with the hospitalist for admission.   Deno Etienne, DO 10/27/18 2106

## 2018-10-27 NOTE — Telephone Encounter (Signed)
Alyson received a call from [place], RN that Ms. Leclaire has decided to proceed with hospice at this time. Alyson called the MD for the peer-to-peer to let him know we didn't need to proceed with the Verzenio appeal.

## 2018-10-27 NOTE — Telephone Encounter (Signed)
Received call from [facility] stating the patient's family has reached out for admission to hospice. They states patient 'has given up" and she is "refusing to eat or drink".   Notified Dr Marin Olp. He is aware of patient request and will be attending. [facility] can maintain symptom management.

## 2018-10-27 NOTE — Telephone Encounter (Signed)
Oral Chemotherapy Pharmacist Encounter   Received a call he insurance in reference to the appeal submitted for [place]. The provider on the phone, Dr. Fanny Skates, wanted to speak with Dr. Marin Olp for a peer-to-peer. He asked that Dr. Marin Olp return his call as soon as possible. I called and spoke with Roselyn Reef, RN to give her the peer-to-peer instructions.  Dr. Marin Olp should call Dr. Fanny Skates at [phone].  Darl Pikes, PharmD, BCPS, [facility] Hematology/Oncology Clinical Pharmacist [facility] [place] [facility] [phone]  10/27/2018 9:40 AM

## 2018-10-27 NOTE — Telephone Encounter (Signed)
Oral Chemotherapy Pharmacist Encounter   Received a call from Roselyn Reef, [place] that [name] has decided to proceed with hospice at this time. I called back the peer-to-peer MD to let him know we no longer needed to proceed with the Verzenio appeal.  Darl Pikes, PharmD, BCPS, [facility] Hematology/Oncology Clinical Pharmacist [facility] [place] [facility] [phone]  10/27/2018 9:59 AM

## 2018-10-27 NOTE — ED Notes (Signed)
Chaplain is at bedside with patient and [facility] Michelle Bowen is at bedside with patient.

## 2018-10-27 NOTE — ED Provider Notes (Signed)
[facility] DEPT Provider Note   CSN: [id number] Arrival date & time: 10/27/18  1235    History   Chief Complaint Chief Complaint  Patient presents with  . Cancer Patient  . Failure To Thrive    HPI Michelle Bowen is a 78 y.o. female.     78 year old female presents with decreased level of consciousness times several days.  Patient has a history of breast cancer with brain metastasis.  According to the niece who I spoke to on the phone with whose phone number is [phone], patient has not want to eat or drink.  She is currently receiving radiation therapy.  No reported fever, cough, vomiting or diarrhea.  The knees spoke with the patient's oncologist who recommended the patient come here.  According to the patient's niece, patient is a DNR     Past Medical History:  Diagnosis Date  . Arthritis    bil knees, left thumb  . Breast cancer ([place]) 12/06/15   Right  . Breast cancer metastasized to brain, unspecified laterality ([place]) 10/11/2018  . Breast cancer metastasized to liver, unspecified laterality ([place]) 10/11/2018  . Cancer, metastatic to bone ([place]) 08/2016  . Counseling regarding goals of care 06/11/2017  . Hypertension   . Iron deficiency anemia due to chronic blood loss 10/07/2018    Patient Active Problem List   Diagnosis Date Noted  . 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 Breast cancer metastasized to brain, unspecified laterality ([place]) 10/11/2018  . Iron deficiency anemia due to chronic blood loss 10/07/2018  . Breast cancer metastasized to bone ([place]) 10/04/2018  . Goals of care, counseling/discussion 06/11/2017  . Bone metastases ([place]) 04/27/2017  . Breast cancer of upper-outer quadrant of right female breast ([place]) 12/17/2015    Past Surgical History:  Procedure Laterality Date  . BREAST LUMPECTOMY WITH AXILLARY LYMPH NODE DISSECTION Right 01/17/2016   Procedure: RIGHT BREAST LUMPECTOMY  WITH AXILLARY LYMPH NODE DISSECTION;  Surgeon: [name], MD;  Location: [facility];  Service: General;  Laterality: Right;  . BREAST SURGERY Right 2001   sebaceous cyst removal  . RADIOACTIVE SEED GUIDED EXCISIONAL BREAST BIOPSY Left 01/17/2016   Procedure: RADIOACTIVE SEED GUIDED EXCISIONAL LEFT BREAST BIOPSY;  Surgeon: [name], MD;  Location: [place];  Service: General;  Laterality: Left;  . RE-EXCISION OF BREAST LUMPECTOMY Right 02/05/2016   Procedure: RE-EXCISION OF BREAST LUMPECTOMY AND REMOVAL OF NIPPLE;  Surgeon: [name], MD;  Location: [facility];  Service: General;  Laterality: Right;  . TONSILLECTOMY       OB History   No obstetric history on file.      Home Medications    Prior to Admission medications   Medication Sig Start Date End Date Taking? Authorizing Provider  Cholecalciferol (VITAMIN D3) 5000 units TABS Take by mouth.    [provider]  cyanocobalamin 500 MCG tablet Take 500 mcg by mouth daily.    [provider]  dexamethasone (DECADRON) 4 MG tablet Take 2 tablets (8 mg total) by mouth daily. 10/12/18   Volanda Napoleon, MD  famotidine (PEPCID) 40 MG tablet Take 1 tablet (40 mg total) by mouth at bedtime. 10/09/18   Volanda Napoleon, MD  fentaNYL (DURAGESIC) 12 MCG/HR Place 2 patches onto the skin every 3 (three) days. 10/25/18   Volanda Napoleon, MD  fluconazole (DIFLUCAN) 100 MG tablet Take 1 tablet (100 mg total) by mouth daily.  10/12/18   Volanda Napoleon, MD  gabapentin (NEURONTIN) 300 MG capsule Take 1 capsule (300 mg total) by mouth 3 (three) times daily. 10/12/18   Volanda Napoleon, MD  irbesartan (AVAPRO) 75 MG tablet Take 1 tablet (75 mg total) by mouth daily. 10/12/18   Volanda Napoleon, MD  Lactobacillus (PROBIOTIC ACIDOPHILUS PO) Take by mouth.    [provider]  letrozole (FEMARA) 2.5 MG tablet Take 1 tablet (2.5 mg total) by mouth daily. Patient not taking: Reported on  10/04/2018 10/15/17   Volanda Napoleon, MD  oxyCODONE (OXY IR/ROXICODONE) 5 MG immediate release tablet Take 1-2, if needed, every 4  hours for pain 10/21/18   Volanda Napoleon, MD  polyethylene glycol (MIRALAX / GLYCOLAX) 17 g packet Take 17 g by mouth daily. 10/09/18   Volanda Napoleon, MD  temazepam (RESTORIL) 7.5 MG capsule Take 1 capsule (7.5 mg total) by mouth at bedtime as needed for sleep. 10/09/18   Volanda Napoleon, MD    Family History Family History  Problem Relation Age of Onset  . Breast cancer Sister 30       breast  . Breast cancer Other        neice at 49  . Breast cancer Other        neice at 24    Social History Social History   Tobacco Use  . Smoking status: Former Research scientist (life sciences)  . Smokeless tobacco: Never Used  Substance Use Topics  . Alcohol use: Yes    Comment: social  . Drug use: No    Comment: smoke 1pack per week 7y, quit age 63     Allergies   Patient has no known allergies.   Review of Systems Review of Systems  Unable to perform ROS: Acuity of condition     Physical Exam Updated Vital Signs BP (!) 189/106 (BP Location: Right Arm)   Pulse 91   Temp 97.7 F (36.5 C) (Axillary)   Resp 16   Ht 1.6 m (5\' 3" )   Wt 63 kg   LMP 12/26/2015   SpO2 98%   BMI 24.60 kg/m   Physical Exam Vitals signs and nursing note reviewed.  Constitutional:      General: She is not in acute distress.    Appearance: Normal appearance. She is well-developed. She is not toxic-appearing.  HENT:     Head: Normocephalic and atraumatic.  Eyes:     General: Lids are normal.     Conjunctiva/sclera: Conjunctivae normal.     Pupils: Pupils are equal, round, and reactive to light.  Neck:     Musculoskeletal: Normal range of motion and neck supple.     Thyroid: No thyroid mass.     Trachea: No tracheal deviation.  Cardiovascular:     Rate and Rhythm: Normal rate and regular rhythm.     Heart sounds: Normal heart sounds. No murmur. No gallop.   Pulmonary:     Effort:  Pulmonary effort is normal. No respiratory distress.     Breath sounds: Normal breath sounds. No stridor. No decreased breath sounds, wheezing, rhonchi or rales.  Abdominal:     General: Bowel sounds are normal. There is no distension.     Palpations: Abdomen is soft.     Tenderness: There is no abdominal tenderness. There is no rebound.  Musculoskeletal: Normal range of motion.        General: No tenderness.  Skin:    General: Skin is warm and dry.  Findings: No abrasion or rash.  Neurological:     Mental Status: She is lethargic and disoriented.     GCS: GCS eye subscore is 2. GCS verbal subscore is 2. GCS motor subscore is 5.  Psychiatric:        Attention and Perception: She is inattentive.      ED Treatments / Results  Labs (all labs ordered are listed, but only abnormal results are displayed) Labs Reviewed  URINE CULTURE  SARS CORONAVIRUS 2 ([place])  CBC WITH DIFFERENTIAL/PLATELET  COMPREHENSIVE METABOLIC PANEL  URINALYSIS, ROUTINE W REFLEX MICROSCOPIC    EKG None  Radiology No results found.  Procedures Procedures (including critical care time)  Medications Ordered in ED Medications  0.9 %  sodium chloride infusion (has no administration in time range)     Initial Impression / Assessment and Plan / ED Course  I have reviewed the triage vital signs and the nursing notes.  Pertinent labs & imaging results that were available during my care of the patient were reviewed by me and considered in my medical decision making (see chart for details).       Patient is COVID negative.  Urinalysis without signs of infection. CBC is pending at this time .  Case discussed with patient is relatively states that patient 3 days ago stated that she did not want to live anymore and that she was stop eating.  Relative notes decreased oral intake since then.  IV fluids ordered here.  Patient reexamined and seems to be giving  poor effort in terms of following commands and responding.  When directly stimulated patient was able to pull the blankets over her appropriately and turn her head.  Head CT has been ordered and is pending at this time.  Patient will likely require admission and will be signed out to next provider  Final Clinical Impressions(s) / ED Diagnoses   Final diagnoses:  None    ED Discharge Orders    None       Lacretia Leigh, MD 10/27/18 1442

## 2018-10-27 NOTE — ED Notes (Signed)
Report given to Lanette [place] for 4E, Room 1414.   RN will transport patient upstairs once [facility] and [place] [facility]. Have approved family member / Healthcare POA to go with patient to floor.

## 2018-10-27 NOTE — ED Notes (Addendum)
Spoke to [place] who advised ok with [place] CNO if son stays but wont be able to come and go.  Spoke to son at bedside, his family member is a Marine scientist and on her way to [facility].  Son feels he will leave tonight to go home ( he just arrived from [place]) and tomorrow they will have pt. Transferred home on palliative care so no one will have to stay in the hospital with patient.

## 2018-10-27 NOTE — ED Notes (Signed)
Bed: [facility]06 Expected date:  Expected time:  Means of arrival:  Comments: EMS-failure to thrive

## 2018-10-27 NOTE — Telephone Encounter (Signed)
Patients son called in on 5/5 & 5/6 and demanding a return call from Dr Marin Olp requesting to speak with him about his mothers care. He threatened to take legal action if necessary and also stated that he would be getting on the highway heading here to speak face to face with Dr Marin Olp.  I forwarded in basket message to Dr Marin Olp 5/5.  He tried to return call and was unable to reach Mr [place].  Per Charlsie Merles she will advise Dr Marin Olp about his 2nd phone call today.

## 2018-10-27 NOTE — Consult Note (Signed)
Referral MD  Reason for Referral: Metastatic breast cancer; altered mental status  Chief Complaint  Patient presents with  . Cancer Patient  . Failure To Thrive  : Patient really cannot give any history.  HPI: Michelle Bowen is well-known to me.  She is a very charming 78 year old African-American female.  She has metastatic breast cancer.  She was recently in the hospital.  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  We changed her protocol to Faslodex/Verzenio along with Kadcyla.  She has had 1 dose of Faslodex and 1 dose of Kadcyla.  She has had radiation therapy to the brain.  She has had I think 8 treatments to date.  In talking to her son, she was doing well on Sunday.  She had a really good day.  On Monday, she "shut down."  She was not that responsive.  She refused radiation.  She was not eating.  She has not taken her medications.  We finally were able to get her to the hospital today.  She did have a CT scan of the brain done in the ER.  She had a little bit of swelling from the tumors.  This might be from her not taking Decadron for the past 2 days.  There is no new obvious tumors.  There is no bleed.  Her labs did look all that bad.  She has had some psychological issues in the past according to her son.  She is very strong-willed and he feels that she is trying to will herself to die.  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  With her labs, her platelet count is low at 66,000.  This might be from radiation.  Her thyroid probably needs to be checked to make sure she is not hypothyroid.  She should do well with treatment for her breast cancer.  Given that her cancer is HER-2 positive and ER positive, treatment should really be effective.  According to her son, she is just tired and is tired of going to doctors.  He thinks that she just wants to give up.  She has had no obvious bleeding.  There is been no obvious fever.   Past Medical History:  Diagnosis  Date  . Arthritis    bil knees, left thumb  . Breast cancer ([place]) 12/06/15   Right  . Breast cancer metastasized to brain, unspecified laterality ([place]) 10/11/2018  . Breast cancer metastasized to liver, unspecified laterality ([place]) 10/11/2018  . Cancer, metastatic to bone ([place]) 08/2016  . Counseling regarding goals of care 06/11/2017  . Hypertension   . Iron deficiency anemia due to chronic blood loss 10/07/2018  :  Past Surgical History:  Procedure Laterality Date  . BREAST LUMPECTOMY WITH AXILLARY LYMPH NODE DISSECTION Right 01/17/2016   Procedure: RIGHT BREAST LUMPECTOMY WITH AXILLARY LYMPH NODE DISSECTION;  Surgeon: [name], MD;  Location: [facility];  Service: General;  Laterality: Right;  . BREAST SURGERY Right 2001   sebaceous cyst removal  . RADIOACTIVE SEED GUIDED EXCISIONAL BREAST BIOPSY Left 01/17/2016   Procedure: RADIOACTIVE SEED GUIDED EXCISIONAL LEFT BREAST BIOPSY;  Surgeon: [name], MD;  Location: [facility];  Service: General;  Laterality: Left;  . RE-EXCISION OF BREAST LUMPECTOMY Right 02/05/2016   Procedure: RE-EXCISION OF BREAST LUMPECTOMY AND REMOVAL OF NIPPLE;  Surgeon: [name], MD;  Location: [facility];  Service: General;  Laterality: Right;  . TONSILLECTOMY    :   Current  Facility-Administered Medications:  .  0.9 %  sodium chloride infusion, , Intravenous, Continuous, Adhikari, Amrit, MD, Last Rate: 125 mL/hr at 10/27/18 1318 .  dexamethasone (DECADRON) injection 20 mg, 20 mg, Intravenous, Once, Ennever, Peter R, MD .  heparin lock flush 100 unit/mL, 500 Units, Intracatheter, Once PRN, Allen, Anthony, MD .  labetalol (NORMODYNE) injection 10 mg, 10 mg, Intravenous, Q2H PRN, Adhikari, Amrit, MD .  morphine 2 MG/ML injection 2 mg, 2 mg, Intravenous, Q4H PRN, Adhikari, Amrit, MD .  polyethylene glycol (MIRALAX / GLYCOLAX) packet 17 g, 17 g, Oral, Daily, Adhikari, Amrit, MD  Current Outpatient Medications:   .  Cholecalciferol (VITAMIN D3) 5000 units TABS, Take by mouth., Disp: , Rfl:  .  cyanocobalamin 500 MCG tablet, Take 500 mcg by mouth daily., Disp: , Rfl:  .  dexamethasone (DECADRON) 4 MG tablet, Take 2 tablets (8 mg total) by mouth daily., Disp: 14 tablet, Rfl: 0 .  famotidine (PEPCID) 40 MG tablet, Take 1 tablet (40 mg total) by mouth at bedtime., Disp: 30 tablet, Rfl: 3 .  fentaNYL (DURAGESIC) 12 MCG/HR, Place 2 patches onto the skin every 3 (three) days., Disp: 20 patch, Rfl: 0 .  fluconazole (DIFLUCAN) 100 MG tablet, Take 1 tablet (100 mg total) by mouth daily., Disp: 14 tablet, Rfl: 0 .  gabapentin (NEURONTIN) 300 MG capsule, Take 1 capsule (300 mg total) by mouth 3 (three) times daily., Disp: 14 capsule, Rfl: 0 .  irbesartan (AVAPRO) 75 MG tablet, Take 1 tablet (75 mg total) by mouth daily., Disp: 14 tablet, Rfl: 0 .  Lactobacillus (PROBIOTIC ACIDOPHILUS PO), Take by mouth., Disp: , Rfl:  .  letrozole (FEMARA) 2.5 MG tablet, Take 1 tablet (2.5 mg total) by mouth daily. (Patient not taking: Reported on 10/04/2018), Disp: 90 tablet, Rfl: 3 .  oxyCODONE (OXY IR/ROXICODONE) 5 MG immediate release tablet, Take 1-2, if needed, every 4  hours for pain, Disp: 90 tablet, Rfl: 0 .  polyethylene glycol (MIRALAX / GLYCOLAX) 17 g packet, Take 17 g by mouth daily., Disp: 14 each, Rfl: 0 .  temazepam (RESTORIL) 7.5 MG capsule, Take 1 capsule (7.5 mg total) by mouth at bedtime as needed for sleep., Disp: 30 capsule, Rfl: 0:  . dexamethasone  20 mg Intravenous Once  . polyethylene glycol  17 g Oral Daily  :  No Known Allergies:  Family History  Problem Relation Age of Onset  . Breast cancer Sister 30       breast  . Breast cancer Other        neice at 50  . Breast cancer Other        neice at 50  :  Social History   Socioeconomic History  . Marital status: Single    Spouse name: Not on file  . Number of children: Not on file  . Years of education: Not on file  . Highest education level:  Not on file  Occupational History  . Not on file  Social Needs  . Financial resource strain: Not on file  . Food insecurity:    Worry: Not on file    Inability: Not on file  . Transportation needs:    Medical: Not on file    Non-medical: Not on file  Tobacco Use  . Smoking status: Former Smoker  . Smokeless tobacco: Never Used  Substance and Sexual Activity  . Alcohol use: Yes    Comment: social  . Drug use: No    Comment: smoke 1pack per   week 7y, quit age 73  . Sexual activity: Never  Lifestyle  . Physical activity:    Days per week: Not on file    Minutes per session: Not on file  . Stress: Not on file  Relationships  . Social connections:    Talks on phone: Not on file    Gets together: Not on file    Attends religious service: Not on file    Active member of club or organization: Not on file    Attends meetings of clubs or organizations: Not on file    Relationship status: Not on file  . Intimate partner violence:    Fear of current or ex partner: Not on file    Emotionally abused: Not on file    Physically abused: Not on file    Forced sexual activity: Not on file  Other Topics Concern  . Not on file  Social History Narrative  . Not on file  :  Review of Systems  Constitutional: Positive for malaise/fatigue.  HENT: Positive for hearing loss.   Eyes: Positive for blurred vision.  Respiratory: Negative.   Cardiovascular: Negative.   Gastrointestinal: Negative.   Genitourinary: Negative.   Musculoskeletal: Positive for neck pain.  Skin: Negative.   Neurological: Positive for weakness.  Endo/Heme/Allergies: Negative.   Psychiatric/Behavioral: Positive for depression.     Exam: Elderly appearing African-American female.  She has vital signs show temperature of 98.  Pulse 81.  Pressure 177/89.  Oxygen saturation is 100%.  Head neck exam shows both eyes closed.  She does have the right cranial nerve VII deficit.  I cannot look at her oral cavity.  Neck is  supple with no adenopathy.  Lungs are clear.  Cardiac exam regular rate and rhythm.  She has no murmurs, rubs or bruits.  Abdomen is soft.  She has good bowel sounds.  There is no fluid wave.  There is no palpable liver or spleen tip.  Extremities shows some minimal muscle atrophy in upper lower extremities.  Skin exam is very dry.  Neurological exam shows the lethargy.  Patient Vitals for the past 24 hrs:  BP Temp Temp src Pulse Resp SpO2 Height Weight  10/27/18 1730 (!) 177/89 - - 81 15 100 % - -  10/27/18 1700 (!) 174/94 - - 88 13 100 % - -  10/27/18 1630 (!) 199/100 - - 88 14 100 % - -  10/27/18 1615 (!) 188/90 - - 85 16 100 % - -  10/27/18 1600 (!) 179/87 - - 86 16 100 % - -  10/27/18 1545 (!) 202/90 - - 86 20 100 % - -  10/27/18 1530 (!) 186/87 - - 79 14 100 % - -  10/27/18 1515 (!) 176/84 - - 78 (!) 21 100 % - -  10/27/18 1430 (!) 168/104 - - 91 16 100 % - -  10/27/18 1415 (!) 178/98 - - 89 (!) 22 100 % - -  10/27/18 1400 (!) 173/100 - - 99 15 100 % - -  10/27/18 1345 (!) 190/104 - - (!) 104 (!) 22 100 % - -  10/27/18 1330 (!) 169/107 - - (!) 108 18 100 % - -  10/27/18 1322 - 100.2 F (37.9 C) Rectal - - - - -  10/27/18 1315 (!) 187/116 - - (!) 108 (!) 22 100 % - -  10/27/18 1300 (!) 179/100 - - 87 15 100 % - -  10/27/18 1258 - - - - - -  5' 3" (1.6 m) 138 lb 14.2 oz (63 kg)  10/27/18 1252 - - - - - 98 % - -  10/27/18 1248 (!) 189/106 97.7 F (36.5 C) Axillary 91 16 99 % - -     Recent Labs    10/27/18 1335  WBC 4.3  HGB 13.3  HCT 43.3  PLT 66*   Recent Labs    10/27/18 1335  NA 142  K 4.2  CL 104  CO2 25  GLUCOSE 100*  BUN 36*  CREATININE 1.25*  CALCIUM 8.5*    Blood smear review: None  Pathology: None    Assessment and Plan: Michelle Bowen is a 78 year old African-American female.  She has metastatic breast cancer.  I have to believe that what we are seeing is more psychosomatic than physiologic.  I just cannot find anything on her exam or with her labs or  other CT scan that would causes dramatic change in mental status.  I totally understand that she is tired and that she does not want to have anymore treatment.  I appreciate this.  It is unfortunate because what she has can be treated very easily.  I will try to do what we can to reverse this.  I will know if checking the thyroid will help.  I will know if she needs to be on some type of antidepressant.  I know she is not going to eat in her current state of mind.  I did talk to her son.  He did drive up from [place] today.  He explained to me the issues that she has had in the past.  He wants her to be comfortable.  He is trying to respect her wishes also.  I really think that we should see how she does over the next couple days.  If she does not make any improvement, then I think we should get her to hospice.  A niece, who is a Marine scientist, would like to have her at home and have hospice come in to the home to help out.  I am just very disappointed that we have not been able to make the improvements that I thought we could with Ms. Remmel.  I really thought that with the change in her treatment protocols that we would be able to make her cancer respond and improve her quality of life.  Michelle Haw, MD  Michelle Bowen 26:20

## 2018-10-27 NOTE — ED Triage Notes (Addendum)
Patient comes form home. Patient is responsive to pain only. Patient is cancer patient and has stopped going to treatment and taking medication, eating and drinking about 4 days ago. Patient has Healthcare POA at bedside, however Son wants patient to be full code.   1mg  Narcan given IV  Patient does have 2x Morphine patches which Paramedic removed.

## 2018-10-27 NOTE — H&P (Signed)
History and Physical    Michelle Bowen PNT:[id number] DOB: 07-03-1940 DOA: 10/27/2018  PCP: Patient, No Pcp Per   Patient coming from: Home    Chief Complaint: Altered mental status  HPI: Michelle Bowen is a 78 y.o. 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  Patient had been receiving her chemotherapy treatment for metastatic breast cancer by Dr. Marin Olp.  She was also following with palliative radiation therapy until last Friday.  Patient was unresponsive and was unable to provide any history.  All the information was gathered on phone from her niece and son. As per the niece, she was doing okay until last Monday.  Since then, she became progressively weak, lost her appetite, stopped eating or drinking and was lying on the bed all day.  Patient became unresponsive, did not open her eyes.  She was then brought to the emergency department today. There was no mention of fever, chills, shortness of breath, chest pain, abdominal pain, vomiting, diarrhea.  ED Course: CT head done in the emergency department showed increased edema and stable metastatic disease within the left temporal lobe and the petrous portion of the right temporal bone.  Given IV Decadron.  She was found to be hypertensive.  IV fluids started   Past Medical History:  Diagnosis Date   Arthritis    bil knees, left thumb   Breast cancer ([place]) 12/06/15   Right   Breast cancer metastasized to brain, unspecified laterality ([place]) 10/11/2018   Breast cancer metastasized to liver, unspecified laterality ([place]) 10/11/2018   Cancer, metastatic to bone ([place]) 08/2016   Counseling regarding goals of care 06/11/2017   Hypertension    Iron deficiency anemia due to chronic blood loss 10/07/2018    Past Surgical History:  Procedure Laterality Date   BREAST  LUMPECTOMY WITH AXILLARY LYMPH NODE DISSECTION Right 01/17/2016   Procedure: RIGHT BREAST LUMPECTOMY WITH AXILLARY LYMPH NODE DISSECTION;  Surgeon: [name], MD;  Location: [facility];  Service: General;  Laterality: Right;   BREAST SURGERY Right 2001   sebaceous cyst removal   RADIOACTIVE SEED GUIDED EXCISIONAL BREAST BIOPSY Left 01/17/2016   Procedure: RADIOACTIVE SEED GUIDED EXCISIONAL LEFT BREAST BIOPSY;  Surgeon: [name], MD;  Location: [facility];  Service: General;  Laterality: Left;   RE-EXCISION OF BREAST LUMPECTOMY Right 02/05/2016   Procedure: RE-EXCISION OF BREAST LUMPECTOMY AND REMOVAL OF NIPPLE;  Surgeon: [name], MD;  Location: [facility];  Service: General;  Laterality: Right;   TONSILLECTOMY       reports that she has quit smoking. She has never used smokeless tobacco. She reports current alcohol use. She reports that she does not use drugs.  No Known Allergies  Family History  Problem Relation Age of Onset   Breast cancer Sister 46       breast   Breast cancer Other        neice at 22   Breast cancer Other        neice at 64     Prior to Admission medications   Medication Sig Start Date End Date Taking? Authorizing Provider  Cholecalciferol (VITAMIN D3) 5000 units TABS Take by mouth.    [provider]  cyanocobalamin 500 MCG tablet Take 500 mcg by mouth daily.    [provider]  dexamethasone (DECADRON)  4 MG tablet Take 2 tablets (8 mg total) by mouth daily. 10/12/18   Volanda Napoleon, MD  famotidine (PEPCID) 40 MG tablet Take 1 tablet (40 mg total) by mouth at bedtime. 10/09/18   Volanda Napoleon, MD  fentaNYL (DURAGESIC) 12 MCG/HR Place 2 patches onto the skin every 3 (three) days. 10/25/18   Volanda Napoleon, MD  fluconazole (DIFLUCAN) 100 MG tablet Take 1 tablet (100 mg total) by mouth daily. 10/12/18   Volanda Napoleon, MD  gabapentin (NEURONTIN) 300 MG capsule Take 1 capsule (300  mg total) by mouth 3 (three) times daily. 10/12/18   Volanda Napoleon, MD  irbesartan (AVAPRO) 75 MG tablet Take 1 tablet (75 mg total) by mouth daily. 10/12/18   Volanda Napoleon, MD  Lactobacillus (PROBIOTIC ACIDOPHILUS PO) Take by mouth.    [provider]  letrozole (FEMARA) 2.5 MG tablet Take 1 tablet (2.5 mg total) by mouth daily. Patient not taking: Reported on 10/04/2018 10/15/17   Volanda Napoleon, MD  oxyCODONE (OXY IR/ROXICODONE) 5 MG immediate release tablet Take 1-2, if needed, every 4  hours for pain 10/21/18   Volanda Napoleon, MD  polyethylene glycol (MIRALAX / GLYCOLAX) 17 g packet Take 17 g by mouth daily. 10/09/18   Volanda Napoleon, MD  temazepam (RESTORIL) 7.5 MG capsule Take 1 capsule (7.5 mg total) by mouth at bedtime as needed for sleep. 10/09/18   Volanda Napoleon, MD    Physical Exam: Vitals:   10/27/18 1615 10/27/18 1630 10/27/18 1700 10/27/18 1730  BP: (!) 188/90 (!) 199/100 (!) 174/94 (!) 177/89  Pulse: 85 88 88 81  Resp: 16 14 13 15   Temp:      TempSrc:      SpO2: 100% 100% 100% 100%  Weight:      Height:        Constitutional: unresposive Vitals:   10/27/18 1615 10/27/18 1630 10/27/18 1700 10/27/18 1730  BP: (!) 188/90 (!) 199/100 (!) 174/94 (!) 177/89  Pulse: 85 88 88 81  Resp: 16 14 13 15   Temp:      TempSrc:      SpO2: 100% 100% 100% 100%  Weight:      Height:       Eyes: Both eyes closed, left eye remains firmly closed ENMT: Mucous membranes are dry.  Neck: normal, supple, no masses, no thyromegaly Respiratory: clear to auscultation bilaterally, no wheezing, no crackles. Normal respiratory effort. No accessory muscle use.  Cardiovascular: Regular rate and rhythm, no murmurs / rubs / gallops. No extremity edema. 2+ pedal pulses. No carotid bruits.  Abdomen: no tenderness, no masses palpated. No hepatosplenomegaly. Bowel sounds positive.  Musculoskeletal: no clubbing / cyanosis. No joint deformity upper and lower extremities.   Skin: no  rashes, lesions, ulcers. No induration Neurologic: Could not be conducted due to patient's mental status. Foley Catheter:None  Labs on Admission: I have personally reviewed following labs and imaging studies  CBC: Recent Labs  Lab 10/27/18 1335  WBC 4.3  NEUTROABS PENDING  HGB 13.3  HCT 43.3  MCV 89.5  PLT 66*   Basic Metabolic Panel: Recent Labs  Lab 10/27/18 1335  NA 142  K 4.2  CL 104  CO2 25  GLUCOSE 100*  BUN 36*  CREATININE 1.25*  CALCIUM 8.5*   GFR: Estimated Creatinine Clearance: 34.2 mL/min (A) (by C-G formula based on SCr of 1.25 mg/dL (H)). Liver Function Tests: Recent Labs  Lab 10/27/18 1335  AST 49*  ALT 27  ALKPHOS 158*  BILITOT 1.7*  PROT 7.4  ALBUMIN 3.3*   No results for input(s): LIPASE, AMYLASE in the last 168 hours. No results for input(s): AMMONIA in the last 168 hours. Coagulation Profile: No results for input(s): INR, PROTIME in the last 168 hours. Cardiac Enzymes: No results for input(s): CKTOTAL, CKMB, CKMBINDEX, TROPONINI in the last 168 hours. BNP (last 3 results) No results for input(s): PROBNP in the last 8760 hours. HbA1C: No results for input(s): HGBA1C in the last 72 hours. CBG: Recent Labs  Lab 10/27/18 1319 10/27/18 1622  GLUCAP 96 90   Lipid Profile: No results for input(s): CHOL, HDL, LDLCALC, TRIG, CHOLHDL, LDLDIRECT in the last 72 hours. Thyroid Function Tests: No results for input(s): TSH, T4TOTAL, FREET4, T3FREE, THYROIDAB in the last 72 hours. Anemia Panel: No results for input(s): VITAMINB12, FOLATE, FERRITIN, TIBC, IRON, RETICCTPCT in the last 72 hours. Urine analysis:    Component Value Date/Time   COLORURINE YELLOW 10/27/2018 1337   APPEARANCEUR CLEAR 10/27/2018 1337   LABSPEC 1.019 10/27/2018 1337   PHURINE 7.0 10/27/2018 1337   GLUCOSEU NEGATIVE 10/27/2018 1337   HGBUR NEGATIVE 10/27/2018 [place] 10/27/2018 1337   KETONESUR 20 (A) 10/27/2018 1337   PROTEINUR 30 (A)  10/27/2018 1337   NITRITE NEGATIVE 10/27/2018 1337   LEUKOCYTESUR NEGATIVE 10/27/2018 1337    Radiological Exams on Admission: Ct Head Wo Contrast  Result Date: 10/27/2018 CLINICAL DATA:  78 year old female with altered mental status for the past several days. History of brain cancer with metastatic disease. EXAM: CT HEAD WITHOUT CONTRAST TECHNIQUE: Contiguous axial images were obtained from the base of the skull through the vertex without intravenous contrast. COMPARISON:  Most recent prior brain MRI 10/05/2018 FINDINGS: Brain: Masslike hypointensity within the inferior left temporal lobe and inferior aspect of the left frontal lobe are again noted consistent with known brain metastases and associated cytogenic edema. There are some Stipe old calcifications within the dominant masses in the left temporal lobe. Comparing across modalities to the prior MRI, there may be slightly more edema extending into the white matter of the inferior left frontal lobe. No new hydro cephalo assess or midline shift. No evidence of acute intracranial hemorrhage or infarct. Additional foci of periventricular, deep and subcortical white matter hypoattenuation are present bilaterally consistent with chronic microvascular ischemic white matter disease. Vascular: No hyperdense vessel or unexpected calcification. Skull: Subtle infiltrative lesion within the petrous portion of the right temporal bone consistent with known metastatic disease. Sinuses/Orbits: No acute finding. Other: None. IMPRESSION: 1. Perhaps slight interval increase in edema extending into the white matter of the inferior left frontal lobe when comparing across modalities to the prior MRI dated 10/05/2018. 2. Otherwise, similar appearance of metastatic disease within the left temporal lobe and the petrous portion of the right temporal bone. 3. No evidence of intracranial hemorrhage, hydrocephalus or midline shift. Electronically Signed   By: Jacqulynn Cadet M.D.    On: 10/27/2018 16:00   Dg Chest Port 1 View  Result Date: 10/27/2018 CLINICAL DATA:  History of metastatic breast carcinoma with decreased responsiveness, initial encounter EXAM: PORTABLE CHEST 1 VIEW COMPARISON:  10/04/2018 FINDINGS: Cardiac shadow is stable. Left-sided chest wall port is again seen and stable. Postsurgical changes in the right breast and thoracolumbar spine are again seen and stable. Metastatic disease in the posterior left rib cage is noted similar to that seen on prior exams. No new focal infiltrate or effusion is seen. IMPRESSION: Changes consistent  with the patient's known history. No acute abnormality noted. Electronically Signed   By: Inez Catalina M.D.   On: 10/27/2018 13:41     Assessment/Plan Principal Problem:   AMS (altered mental status) Active Problems:   Iron deficiency anemia due to chronic blood loss   Breast cancer metastasized to liver, unspecified laterality ([facility])  Altered mental status: Due to her metastatic breast cancer to the brain.  CT finding as above.  Continue Decadron.  Keep n.p.o. for now.  Continue to monitor mental status.  Metastatic breast cancer: Metastatic disease to the brain, bone and liver.  Follows with Dr. Marin Olp.On kadcyla,Faslodex,Verzenio,Xgeva as per oncology notes.  Receiving palliative radiation therapy for CNS metastasis/bone metastasis.  Following Dr. Lisbeth Renshaw.  Last radiation therapy last Friday.  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  Hypertensive urgency: Blood pressure in the range of 190/100 mmHg.  Continue to monitor on telemetry.  Continue labetalol as needed.No H/O hypertension in the past.  Loss of appetite/poor oral intake: Continue gentle IV fluids  Acute kidney injury: Associated with dehydration.  Continue IV fluids.  Goals of care: Had extensive discussion with the niece and the son.  At this point, she is clearly a hospice candidate.  Niece wants to take her home with hospice but patient seems more appropriate  for residential hospice.  I have requested for case manager consultation, palliative care consultation. Comfort care not started yet with plan to monitor tonight to see if there will be any improvement in the mental status.  Patient is a DNR.Dr Marin Olp discussing  with family today.    Severity of Illness: The appropriate patient status for this patient is INPATIENT. Needs inpatient status due to her mental status, requirement of IV fluids.Not able to eat or drink. Also in hypertensive urgency.  DVT prophylaxis:SCD  Code Status: DNR Family Communication: Niece, son Consults called: Oncology, palliative care     Shelly Coss MD [facility] Pager [phone]  If 7PM-7AM, please contact night-coverage [email or web address] Password [facility]  10/27/2018, 5:36 PM

## 2018-10-28 DIAGNOSIS — Z515 Encounter for palliative care: Secondary | ICD-10-CM

## 2018-10-28 LAB — URINE CULTURE: Culture: NO GROWTH

## 2018-10-28 LAB — BASIC METABOLIC PANEL
Anion gap: 10 (ref 5–15)
BUN: 29 mg/dL — ABNORMAL HIGH (ref 8–23)
CO2: 19 mmol/L — ABNORMAL LOW (ref 22–32)
Calcium: 7.4 mg/dL — ABNORMAL LOW (ref 8.9–10.3)
Chloride: 111 mmol/L (ref 98–111)
Creatinine, Ser: 0.89 mg/dL (ref 0.44–1.00)
GFR calc Af Amer: >60 mL/min (ref >60)
GFR calc non Af Amer: >60 mL/min (ref >60)
Glucose, Bld: 131 mg/dL — ABNORMAL HIGH (ref 70–99)
Potassium: 3.6 mmol/L (ref 3.5–5.1)
Sodium: 140 mmol/L (ref 135–145)

## 2018-10-28 LAB — CBC
HCT: 35.9 % — ABNORMAL LOW (ref 36.0–46.0)
Hemoglobin: 11 g/dL — ABNORMAL LOW (ref 12.0–15.0)
MCH: 27.6 pg (ref 26.0–34.0)
MCHC: 30.6 g/dL (ref 30.0–36.0)
MCV: 90.2 fL (ref 80.0–100.0)
Platelets: 65 10*3/uL — ABNORMAL LOW (ref 150–400)
RBC: 3.98 MIL/uL (ref 3.87–5.11)
RDW: 20.4 % — ABNORMAL HIGH (ref 11.5–15.5)
WBC: 3.3 10*3/uL — ABNORMAL LOW (ref 4.0–10.5)
nRBC: 0 % (ref 0.0–0.2)

## 2018-10-28 LAB — PREALBUMIN: Prealbumin: 19.5 mg/dL (ref 18–38)

## 2018-10-28 MED ORDER — SULFACETAMIDE SODIUM 10 % OP SOLN
2.0000 [drp] | Freq: Four times a day (QID) | OPHTHALMIC | Status: DC
  Administered 2018-10-28 – 2018-10-30 (×7): 2 [drp] via OPHTHALMIC
  Filled 2018-10-28: qty 15

## 2018-10-28 MED ORDER — DEXAMETHASONE SODIUM PHOSPHATE 4 MG/ML IJ SOLN
20.0000 mg | INTRAMUSCULAR | Status: DC
  Filled 2018-10-28: qty 5

## 2018-10-28 MED ORDER — DEXTROSE-NACL 5-0.9 % IV SOLN
INTRAVENOUS | Status: DC
  Administered 2018-10-28 – 2018-10-29 (×3): via INTRAVENOUS

## 2018-10-28 MED ORDER — SODIUM CHLORIDE 0.9 % IV SOLN
20.0000 mg | INTRAVENOUS | Status: DC
  Administered 2018-10-28 – 2018-10-29 (×2): 20 mg via INTRAVENOUS
  Filled 2018-10-28 (×3): qty 2

## 2018-10-28 NOTE — TOC Progression Note (Signed)
Transition of Care [facility]) - Progression Note    Patient Details  Name: Michelle Bowen MRN: [id number] Date of Birth: 06-Apr-1941  Transition of Care [facility]) CM/SW Contact  Kashena Novitski, Juliann Pulse, RN Phone Number: 10/28/2018, 9:55 AM  Clinical Narrative: CM referral for home hospice vs residential hospice-Patient from home w/SOC of home hospice with [facility] on yesterday but patient was admitted to [place] confirmed. Will f/u w/niece on d/c plans.           Expected Discharge Plan and Services                                                 Social Determinants of Health (SDOH) Interventions    Readmission Risk Interventions No flowsheet data found.

## 2018-10-28 NOTE — Progress Notes (Signed)
Patient has not had urine output since this morning.  Performed bladder scan.  Patient retaining 430 mL.  Text paged MD to notify.  He ordered in/out catheterization.

## 2018-10-28 NOTE — Progress Notes (Signed)
Overall, Michelle Bowen is about the same I guess.  Her son, who is allowed to stay with her says that she did wake up and open her eyes last night.  This morning her eyes are closed.  He said that she has a lot of irritation with the eyes.  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  She is now been able to fully close the right eye.  I suspect she probably has some infection and there.  I will going give her some eyedrops and we will see how that does.  Her thyroid is okay.  Will check her cultures make sure there is no infection.  I still think that this is something that is psychological.  In talking to her son, she had an episode a few years ago where she would not walk for 3 months because she had cancer in her back.  However, she had no physical limitation.  She just did not want to walk and then began to walk again.  I am not sure how we can fix this issue.  I do not know if there is any role for psychiatry.  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  I know that treatment will help it.  It is a matter of her wanting to do treatment.  We certainly want to make sure that her comfort is a top priority.  She is not taking any pain medication.  Her blood pressure is persistently on the high side.  Her physical exam is pretty much unchanged from when I saw her last night.  Her labs look okay.  Her calcium is a little bit on the low side.  I am sure her albumin is low and that the corrected calcium is probably close to normal.  I suspect that her thrombocytopenia might be from the cranial radiation that she had.  I still think that she would benefit from being in the hospital another day or so.  It will give her niece a chance to recover and then we can see about getting hospice set up for her at home.  I know that she will get fantastic care from all the staff up on 4 E.   Michelle Haw, MD  Michelle Bowen 26:20

## 2018-10-28 NOTE — TOC Initial Note (Signed)
Transition of Care [facility]) - Initial/Assessment Note    Patient Details  Name: Michelle Bowen MRN: [id number] Date of Birth: January 08, 1941  Transition of Care [facility]) CM/SW Contact:    Dessa Phi, RN Phone Number: 10/28/2018, 2:50 PM  Clinical Narrative: CM referral for home hospice vs Residential hospice-spoke to Son(Douglas) in rm about d/c plans-he plans for patient to go home & patient's niece [name] to continue to care for patient. Nathaneil Canary mentioned his concerns of ivf,& iv BP meds, wanting his mom to be able to eat, & needing an update from Dr. Marin Olp on labs, & treatment. TC [facility] do not provide services if: ivf,iv meds,or chemo. Recc clarification of goals.Also spoke to patient's niece [name]-she plans for patient to come home w/[facility]. Noted palliative cons-await recc.MD updated.                  Expected Discharge Plan: Home w Hospice Care Barriers to Discharge: Continued Medical Work up   Patient Goals and CMS Choice Patient states their goals for this hospitalization and ongoing recovery are:: go home CMS Medicare.gov Compare Post Acute Care list provided to:: Patient Represenative (must comment) Choice offered to / list presented to : Adult Children  Expected Discharge Plan and Services Expected Discharge Plan: Home w Hospice Care   Discharge Planning Services: CM Consult Post Acute Care Choice: Hospice Living arrangements for the past 2 months: Single Family Home                                      Prior Living Arrangements/Services Living arrangements for the past 2 months: Single Family Home Lives with:: Relatives Patient language and need for interpreter reviewed:: Yes Do you feel safe going back to the place where you live?: Yes      Need for Family Participation in Patient Care: No (Comment) Care giver support system in place?: Yes (comment)   Criminal Activity/Legal Involvement Pertinent to  Current Situation/Hospitalization: No - Comment as needed  Activities of Daily Living Home Assistive Devices/Equipment: Cane (specify quad or straight), Eyeglasses, Walker (specify type)(quad cane, 4wheeled walker) ADL Screening (condition at time of admission) Patient's cognitive ability adequate to safely complete daily activities?: No Is the patient deaf or have difficulty hearing?: No Does the patient have difficulty seeing, even when wearing glasses/contacts?: No Does the patient have difficulty concentrating, remembering, or making decisions?: Yes Patient able to express need for assistance with ADLs?: No Does the patient have difficulty dressing or bathing?: Yes Independently performs ADLs?: No Communication: Dependent(unresponsive) Is this a change from baseline?: Pre-admission baseline Dressing (OT): Needs assistance Is this a change from baseline?: Pre-admission baseline Grooming: Dependent Is this a change from baseline?: Pre-admission baseline Feeding: Dependent Is this a change from baseline?: Pre-admission baseline Bathing: Dependent Is this a change from baseline?: Pre-admission baseline Toileting: Dependent Is this a change from baseline?: Pre-admission baseline In/Out Bed: Dependent Is this a change from baseline?: Pre-admission baseline Walks in Home: Dependent Is this a change from baseline?: Pre-admission baseline Does the patient have difficulty walking or climbing stairs?: Yes Weakness of Legs: Both Weakness of Arms/Hands: Both  Permission Sought/Granted Permission sought to share information with : Case Manager Permission granted to share information with : Yes, Verbal Permission Granted  Share Information with NAME: Nathaneil Canary Hines/[name]  Permission granted to share info w AGENCY: [facility]  granted to share info w Relationship: Son/niece  Permission granted to share info w Contact Information: [phone]  Emotional Assessment Appearance:: Appears stated age Attitude/Demeanor/Rapport: Lethargic Affect (typically observed): Accepting Orientation: : Oriented to Self Alcohol / Substance Use: Never Used Psych Involvement: No (comment)  Admission diagnosis:  Failure to thrive in adult [R62.7] Patient Active Problem List   Diagnosis Date Noted  . Failure to thrive in adult 10/27/2018  . AMS (altered mental status) 10/27/2018  . 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 Breast cancer metastasized to brain, unspecified laterality ([place]) 10/11/2018  . Iron deficiency anemia due to chronic blood loss 10/07/2018  . Breast cancer metastasized to bone ([place]) 10/04/2018  . Goals of care, counseling/discussion 06/11/2017  . Bone metastases ([place]) 04/27/2017  . Breast cancer of upper-outer quadrant of right female breast ([place]) 12/17/2015   PCP:  Patient, No Pcp Per Pharmacy:   [id number] - [place] - [address] AT [place] [address] [place] [address] Phone: [phone] Fax: [facility] #[id number] - [place], [place] - [address] AT [address] [place] [address] Phone: [phone] Fax: [place], [place] [address] Phone: [phone] Fax: [phone]     Social Determinants of Health (SDOH) Interventions    Readmission Risk Interventions No flowsheet data found.

## 2018-10-28 NOTE — Evaluation (Signed)
SLP Cancellation Note  Patient Details Name: Michelle Bowen MRN: [id number] DOB: [birth year]   Cancelled treatment:       Reason Eval/Treat Not Completed: Other (comment  Pt lethargic, not appropriate for po at this time, spoke to son re: pt and he reports she has not consumed po since Monday and has not opened eyes in hospital, palliative team to see pt today; please advise if SLP needs follow up after palliative meeting and/or discontinue order if deemed appropriate.  Thanks.    Macario Golds 10/28/2018, 4:10 PM   Luanna Salk, [facility] SLP Acute Rehab Services Pager [phone] Office [phone]

## 2018-10-28 NOTE — Progress Notes (Addendum)
PROGRESS NOTE    Michelle Bowen  VZS:[id number] DOB: 1941-04-07 DOA: 10/27/2018 PCP: Patient, No Pcp Per   Brief Narrative: Michelle Bowen is a 78 y.o. 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  Patient had been receiving her chemotherapy treatment for metastatic breast cancer by Dr. Marin Olp.  She was also following with palliative radiation therapy until last Friday.  Patient was unresponsive and was unable to provide any history.  All the information was gathered on phone from her niece and son.As per the niece, she was doing okay until last Monday.  Since then, she became progressively weak, lost her appetite, stopped eating or drinking and was lying on the bed all day.  Patient became unresponsive, did not open her eyes.  She was then brought to the emergency department today. Patient was admitted for the management of altered mental status.  Assessment & Plan:   Principal Problem:   AMS (altered mental status) Active Problems:   Iron deficiency anemia due to chronic blood loss   Breast cancer metastasized to liver, unspecified laterality (H[facility])  Altered mental status: Due to her metastatic breast cancer to the brain.  CT finding as above.  S/p doses of Decadron.  Keep n.p.o. for now.  Continue to monitor mental status.Speech consulted.  Metastatic breast cancer: Metastatic disease to the brain, bone and liver.  Follows with Dr. Marin Olp.On kadcyla,Faslodex,Verzenio,Xgeva as per oncology notes.  Receiving palliative radiation therapy for CNS metastasis/bone metastasis.  Following Dr. Lisbeth Renshaw.  Last radiation therapy last Friday.  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  Hypertensive urgency: Blood pressure up.  Continue to monitor on telemetry.  Continue labetalol as needed.No H/O hypertension in the past.  Loss of  appetite/poor oral intake: Continue gentle IV fluids  Acute kidney injury: Associated with dehydration.  Resolved with IV fluids.  Pancytopenia: Most likely associated with her end-stage cancer and possibly chemotherapy/radiation therapy.  We will continue to monitor.  Goals of care: Had extensive discussion with son at bed side.  She continues to remain unresponsive today.  I have discussed about the idea of hospice .Palliative care consulted.  Her son and niece are very receptive about keeping her comfortable and that was her wish also.Her niece wants to take her home with hospice but patient seems more appropriate for residential hospice.  I have requested for case manager consultation.         DVT prophylaxis: SCD Code Status:DNR Family Communication: Discussed with son at the bedside Disposition Plan: Residential hospice versus hospice at home   Consultants: Oncology, palliative care  Procedures: None Antimicrobials:  Anti-infectives (From admission, onward)   Start     Dose/Rate Route Frequency Ordered Stop   10/27/18 1700  fluconazole (DIFLUCAN) tablet 100 mg  Status:  Discontinued     100 mg Oral Daily 10/27/18 1646 10/27/18 1701      Subjective: Patient seen and examined at bedside this morning.  Hypertensive.  Continues to remain unresponsive.  Unarousable even with painful stimuli.  Objective: Vitals:   10/27/18 2205 10/27/18 2251 10/28/18 0337 10/28/18 0538  BP: (!) 184/95 (!) 177/81 (!) 191/95 (!) 177/89  Pulse: 76 63 65 63  Resp:   18   Temp:   98.9 F (37.2 C)   TempSrc:   Oral   SpO2:   100%   Weight:  Height:        Intake/Output Summary (Last 24 hours) at 10/28/2018 1235 Last data filed at 10/28/2018 0815 Gross per 24 hour  Intake 2895.75 ml  Output 700 ml  Net 2195.75 ml   Filed Weights   10/27/18 1258 10/27/18 2009  Weight: 63 kg 60.5 kg    Examination:  General exam: Unresposive HEENT: Both eyes closed, left eye more firmly closed,  subconjunctival hemorrhage on right eye,left facial droop Respiratory system: Bilateral equal air entry, normal vesicular breath sounds, no wheezes or crackles  Cardiovascular system: S1 & S2 heard, RRR. No JVD, murmurs, rubs, gallops or clicks. No pedal edema. Gastrointestinal system: Abdomen is nondistended, soft and nontender. No organomegaly or masses felt. Normal bowel sounds heard. Central nervous system: Not alert or  oriented.  Extremities: No edema, no clubbing ,no cyanosis, distal peripheral pulses palpable. Skin: No rashes, lesions or ulcers,no icterus ,no pallor  Data Reviewed: I have personally reviewed following labs and imaging studies  CBC: Recent Labs  Lab 10/27/18 1335 10/28/18 0329  WBC 4.3 3.3*  NEUTROABS 3.9  --   HGB 13.3 11.0*  HCT 43.3 35.9*  MCV 89.5 90.2  PLT 66* 65*   Basic Metabolic Panel: Recent Labs  Lab 10/27/18 1335 10/28/18 0329  NA 142 140  K 4.2 3.6  CL 104 111  CO2 25 19*  GLUCOSE 100* 131*  BUN 36* 29*  CREATININE 1.25* 0.89  CALCIUM 8.5* 7.4*   GFR: Estimated Creatinine Clearance: 44.5 mL/min (by C-G formula based on SCr of 0.89 mg/dL). Liver Function Tests: Recent Labs  Lab 10/27/18 1335  AST 49*  ALT 27  ALKPHOS 158*  BILITOT 1.7*  PROT 7.4  ALBUMIN 3.3*   No results for input(s): LIPASE, AMYLASE in the last 168 hours. No results for input(s): AMMONIA in the last 168 hours. Coagulation Profile: No results for input(s): INR, PROTIME in the last 168 hours. Cardiac Enzymes: No results for input(s): CKTOTAL, CKMB, CKMBINDEX, TROPONINI in the last 168 hours. BNP (last 3 results) No results for input(s): PROBNP in the last 8760 hours. HbA1C: No results for input(s): HGBA1C in the last 72 hours. CBG: Recent Labs  Lab 10/27/18 1319 10/27/18 1622  GLUCAP 96 90   Lipid Profile: No results for input(s): CHOL, HDL, LDLCALC, TRIG, CHOLHDL, LDLDIRECT in the last 72 hours. Thyroid Function Tests: Recent Labs     10/27/18 2050  TSH 1.037   Anemia Panel: No results for input(s): VITAMINB12, FOLATE, FERRITIN, TIBC, IRON, RETICCTPCT in the last 72 hours. Sepsis Labs: Recent Labs  Lab 10/27/18 1402 10/27/18 1630  LATICACIDVEN 1.7 1.2    Recent Results (from the past 240 hour(s))  SARS Coronavirus 2 (CEPHEID- Performed in [facility] hospital lab), Hosp Order     Status: None   Collection Time: 10/27/18  1:40 PM  Result Value Ref Range Status   SARS Coronavirus 2 NEGATIVE NEGATIVE Final    Comment: (NOTE) If result is NEGATIVE SARS-CoV-2 target nucleic acids are NOT DETECTED. The SARS-CoV-2 RNA is generally detectable in upper and lower  respiratory specimens during the acute phase of infection. The lowest  concentration of SARS-CoV-2 viral copies this assay can detect is 250  copies / mL. A negative result does not preclude SARS-CoV-2 infection  and should not be used as the sole basis for treatment or other  patient management decisions.  A negative result may occur with  improper specimen collection / handling, submission of specimen other  than nasopharyngeal swab,  presence of viral mutation(s) within the  areas targeted by this assay, and inadequate number of viral copies  (<250 copies / mL). A negative result must be combined with clinical  observations, patient history, and epidemiological information. If result is POSITIVE SARS-CoV-2 target nucleic acids are DETECTED. The SARS-CoV-2 RNA is generally detectable in upper and lower  respiratory specimens dur ing the acute phase of infection.  Positive  results are indicative of active infection with SARS-CoV-2.  Clinical  correlation with patient history and other diagnostic information is  necessary to determine patient infection status.  Positive results do  not rule out bacterial infection or co-infection with other viruses. If result is PRESUMPTIVE POSTIVE SARS-CoV-2 nucleic acids MAY BE PRESENT.   A presumptive positive result  was obtained on the submitted specimen  and confirmed on repeat testing.  While 2019 novel coronavirus  (SARS-CoV-2) nucleic acids may be present in the submitted sample  additional confirmatory testing may be necessary for epidemiological  and / or clinical management purposes  to differentiate between  SARS-CoV-2 and other Sarbecovirus currently known to infect humans.  If clinically indicated additional testing with an alternate test  methodology [phone]) is advised. The SARS-CoV-2 RNA is generally  detectable in upper and lower respiratory sp ecimens during the acute  phase of infection. The expected result is Negative. Fact Sheet for Patients:  StrictlyIdeas.no Fact Sheet for Healthcare Providers: BankingDealers.co.za This test is not yet approved or cleared by the [place] FDA and has been authorized for detection and/or diagnosis of SARS-CoV-2 by FDA under an Emergency Use Authorization (EUA).  This EUA will remain in effect (meaning this test can be used) for the duration of the COVID-19 declaration under Section 564(b)(1) of the Act, 21 U.S.C. section 360bbb-3(b)(1), unless the authorization is terminated or revoked sooner. Performed at [facility], [place] [address] [place], [place] [address]   Blood Culture (routine x 2)     Status: None (Preliminary result)   Collection Time: 10/27/18  1:50 PM  Result Value Ref Range Status   Specimen Description   Final    BLOOD PORTA CATH CHEST Performed at [facility], [place] [address], [place], [place] [address]    Special Requests   Final    BOTTLES DRAWN AEROBIC AND ANAEROBIC Blood Culture adequate volume Performed at [facility] [address] [place], [place] [address]    Culture   Final    NO GROWTH < 24 HOURS Performed at [facility] [address], [place], [place] [address]    Report Status PENDING  Incomplete   Blood Culture (routine x 2)     Status: None (Preliminary result)   Collection Time: 10/27/18  1:57 PM  Result Value Ref Range Status   Specimen Description   Final    BLOOD LEFT ANTECUBITAL Performed at [facility] [address] [place], [place] [address]    Special Requests   Final    BOTTLES DRAWN AEROBIC AND ANAEROBIC Blood Culture adequate volume Performed at [facility] [address] [place], [place] [address]    Culture   Final    NO GROWTH < 24 HOURS Performed at [facility] [address], [place],  [address]    Report Status PENDING  Incomplete         Radiology Studies: Ct Head Wo Contrast  Result Date: 10/27/2018 CLINICAL DATA:  78 year old female with altered mental status  for the past several days. History of brain cancer with metastatic disease. EXAM: CT HEAD WITHOUT CONTRAST TECHNIQUE: Contiguous axial images were obtained from the base of the skull through the vertex without intravenous contrast. COMPARISON:  Most recent prior brain MRI 10/05/2018 FINDINGS: Brain: Masslike hypointensity within the inferior left temporal lobe and inferior aspect of the left frontal lobe are again noted consistent with known brain metastases and associated cytogenic edema. There are some Stipe old calcifications within the dominant masses in the left temporal lobe. Comparing across modalities to the prior MRI, there may be slightly more edema extending into the white matter of the inferior left frontal lobe. No new hydro cephalo assess or midline shift. No evidence of acute intracranial hemorrhage or infarct. Additional foci of periventricular, deep and subcortical white matter hypoattenuation are present bilaterally consistent with chronic microvascular ischemic white matter disease. Vascular: No hyperdense vessel or unexpected calcification. Skull: Subtle infiltrative lesion within the petrous portion of the right temporal bone  consistent with known metastatic disease. Sinuses/Orbits: No acute finding. Other: None. IMPRESSION: 1. Perhaps slight interval increase in edema extending into the white matter of the inferior left frontal lobe when comparing across modalities to the prior MRI dated 10/05/2018. 2. Otherwise, similar appearance of metastatic disease within the left temporal lobe and the petrous portion of the right temporal bone. 3. No evidence of intracranial hemorrhage, hydrocephalus or midline shift. Electronically Signed   By: Jacqulynn Cadet M.D.   On: 10/27/2018 16:00   Dg Chest Port 1 View  Result Date: 10/27/2018 CLINICAL DATA:  History of metastatic breast carcinoma with decreased responsiveness, initial encounter EXAM: PORTABLE CHEST 1 VIEW COMPARISON:  10/04/2018 FINDINGS: Cardiac shadow is stable. Left-sided chest wall port is again seen and stable. Postsurgical changes in the right breast and thoracolumbar spine are again seen and stable. Metastatic disease in the posterior left rib cage is noted similar to that seen on prior exams. No new focal infiltrate or effusion is seen. IMPRESSION: Changes consistent with the patient's known history. No acute abnormality noted. Electronically Signed   By: Inez Catalina M.D.   On: 10/27/2018 13:41        Scheduled Meds:  mouth rinse  15 mL Mouth Rinse BID   polyethylene glycol  17 g Oral Daily   sulfacetamide  2 drop Both Eyes QID   Continuous Infusions:  dexamethasone (DECADRON) IVPB ([facility]) 20 mg (10/28/18 1124)   dextrose 5 % and 0.9% NaCl       LOS: 1 day    Time spent:35 mins. More than 50% of that time was spent in counseling and/or coordination of care.      Shelly Coss, MD [facility] Pager [phone]  If 7PM-7AM, please contact night-coverage [email or web address] Password [email or web address] 10/28/2018, 12:35 PM

## 2018-10-29 ENCOUNTER — Inpatient Hospital Stay (HOSPITAL_COMMUNITY)
Admission: EM | Admit: 2018-10-29 | Discharge: 2018-10-29 | Disposition: A | Discharge status: Home / Self Care | Payer: Medicare Other | Source: Home / Self Care | Attending: Primary Care | Admitting: Primary Care

## 2018-10-29 ENCOUNTER — Telehealth: Payer: Self-pay | Admitting: *Deleted

## 2018-10-29 DIAGNOSIS — R627 Adult failure to thrive: Secondary | ICD-10-CM

## 2018-10-29 DIAGNOSIS — R4182 Altered mental status, unspecified: Secondary | ICD-10-CM

## 2018-10-29 LAB — CBC WITH DIFFERENTIAL/PLATELET
Abs Immature Granulocytes: 0.03 10*3/uL (ref 0.00–0.07)
Basophils Absolute: 0 10*3/uL (ref 0.0–0.1)
Basophils Relative: 0 %
Eosinophils Absolute: 0 10*3/uL (ref 0.0–0.5)
Eosinophils Relative: 0 %
HCT: 33.5 % — ABNORMAL LOW (ref 36.0–46.0)
Hemoglobin: 10.1 g/dL — ABNORMAL LOW (ref 12.0–15.0)
Immature Granulocytes: 1 %
Lymphocytes Relative: 2 %
Lymphs Abs: 0.1 10*3/uL — ABNORMAL LOW (ref 0.7–4.0)
MCH: 27.1 pg (ref 26.0–34.0)
MCHC: 30.1 g/dL (ref 30.0–36.0)
MCV: 89.8 fL (ref 80.0–100.0)
Monocytes Absolute: 0.3 10*3/uL (ref 0.1–1.0)
Monocytes Relative: 5 %
Neutro Abs: 4.7 10*3/uL (ref 1.7–7.7)
Neutrophils Relative %: 92 %
Platelets: 59 10*3/uL — ABNORMAL LOW (ref 150–400)
RBC: 3.73 MIL/uL — ABNORMAL LOW (ref 3.87–5.11)
RDW: 20.4 % — ABNORMAL HIGH (ref 11.5–15.5)
WBC: 5.1 10*3/uL (ref 4.0–10.5)
nRBC: 0 % (ref 0.0–0.2)

## 2018-10-29 LAB — COMPREHENSIVE METABOLIC PANEL
ALT: 23 U/L (ref 0–44)
AST: 31 U/L (ref 15–41)
Albumin: 2.8 g/dL — ABNORMAL LOW (ref 3.5–5.0)
Alkaline Phosphatase: 122 U/L (ref 38–126)
Anion gap: 6 (ref 5–15)
BUN: 25 mg/dL — ABNORMAL HIGH (ref 8–23)
CO2: 21 mmol/L — ABNORMAL LOW (ref 22–32)
Calcium: 7.2 mg/dL — ABNORMAL LOW (ref 8.9–10.3)
Chloride: 113 mmol/L — ABNORMAL HIGH (ref 98–111)
Creatinine, Ser: 0.84 mg/dL (ref 0.44–1.00)
GFR calc Af Amer: >60 mL/min (ref >60)
GFR calc non Af Amer: >60 mL/min (ref >60)
Glucose, Bld: 172 mg/dL — ABNORMAL HIGH (ref 70–99)
Potassium: 3.1 mmol/L — ABNORMAL LOW (ref 3.5–5.1)
Sodium: 140 mmol/L (ref 135–145)
Total Bilirubin: 0.9 mg/dL (ref 0.3–1.2)
Total Protein: 5.8 g/dL — ABNORMAL LOW (ref 6.5–8.1)

## 2018-10-29 MED ORDER — POTASSIUM CHLORIDE 10 MEQ/100ML IV SOLN
10.0000 meq | INTRAVENOUS | Status: AC
  Administered 2018-10-29 (×4): 10 meq via INTRAVENOUS
  Filled 2018-10-29 (×3): qty 100

## 2018-10-29 MED ORDER — POTASSIUM CHLORIDE 10 MEQ/100ML IV SOLN
INTRAVENOUS | Status: AC
  Administered 2018-10-29: 10 meq via INTRAVENOUS
  Filled 2018-10-29: qty 100

## 2018-10-29 MED ORDER — POTASSIUM CHLORIDE 10 MEQ/100ML IV SOLN
INTRAVENOUS | Status: AC
  Administered 2018-10-29: 10 meq
  Filled 2018-10-29: qty 100

## 2018-10-29 NOTE — Consult Note (Addendum)
NEURO HOSPITALIST CONSULT NOTE   Requestig physician: Dr. Tawanna Solo  Reason for Consult: AMS  History obtained from:  Chart/Son  HPI:                                                                                                                                          Michelle Bowen is an 78 y.o. 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  Per Chart: patient was brought in by her niece. Patient was at home and became unresponsive.  She has been receiving chemotherapy for her metastatic breast cancer by Dr. Marin Olp (oncologist).   She was doing okay until  Monday 10/25/2018, when she lost her appetite, stopped eating and drinking, has been lying in the bed all day, and has become progressively weaker. She also decided on Monday that she did not want to complete her last two radiation treatments. She c/o of blurred vision and problems hearing also on Monday.  Per Dr. Antonieta Pert  Note 10/29/2018:  POC was originally to get patient home to hospice; however, son is no longer agreeable to this. He feels that nothing is being done for his mother.  Past Medical History:  Diagnosis Date  . Arthritis    bil knees, left thumb  . Breast cancer ([place]) 12/06/15   Right  . Breast cancer metastasized to brain, unspecified laterality ([place]) 10/11/2018  . Breast cancer metastasized to liver, unspecified laterality ([place]) 10/11/2018  . Cancer, metastatic to bone ([place]) 08/2016  . Counseling regarding goals of care 06/11/2017  . Hypertension   . Iron deficiency anemia due to chronic blood loss 10/07/2018    Past Surgical History:  Procedure Laterality Date  . BREAST LUMPECTOMY WITH AXILLARY LYMPH NODE DISSECTION Right 01/17/2016   Procedure: RIGHT BREAST LUMPECTOMY WITH AXILLARY LYMPH NODE DISSECTION;  Surgeon: [name], MD;  Location: [place];  Service: General;  Laterality: Right;  .  BREAST SURGERY Right 2001   sebaceous cyst removal  . RADIOACTIVE SEED GUIDED EXCISIONAL BREAST BIOPSY Left 01/17/2016   Procedure: RADIOACTIVE SEED GUIDED EXCISIONAL LEFT BREAST BIOPSY;  Surgeon: [name], MD;  Location: [place];  Service: General;  Laterality: Left;  . RE-EXCISION OF BREAST LUMPECTOMY Right 02/05/2016   Procedure: RE-EXCISION OF BREAST LUMPECTOMY AND REMOVAL OF NIPPLE;  Surgeon: [name], MD;  Location: [place];  Service: General;  Laterality: Right;  . TONSILLECTOMY      Family History  Problem Relation Age of Onset  . Breast cancer Sister 30       breast  . Breast cancer Other        neice at 72  . Breast cancer Other  neice at 74    Social History:  reports that she has quit smoking. She has never used smokeless tobacco. She reports current alcohol use. She reports that she does not use drugs.  No Known Allergies  MEDICATIONS:                                                                                                                     Current Facility-Administered Medications  Medication Dose Route Frequency Provider Last Rate Last Dose  . dexamethasone (DECADRON) 20 mg in sodium chloride 0.9 % 50 mL IVPB  20 mg Intravenous Q24H Shelly Coss, MD 208 mL/hr at 10/28/18 1124 20 mg at 10/28/18 1124  . dextrose 5 %-0.9 % sodium chloride infusion   Intravenous Continuous Shelly Coss, MD 75 mL/hr at 10/29/18 0205    . heparin lock flush 100 unit/mL  500 Units Intracatheter Once PRN Lacretia Leigh, MD      . labetalol (NORMODYNE) injection 10 mg  10 mg Intravenous Q2H PRN Shelly Coss, MD   10 mg at 10/29/18 0630  . MEDLINE mouth rinse  15 mL Mouth Rinse BID Shelly Coss, MD   15 mL at 10/28/18 2300  . morphine 2 MG/ML injection 2 mg  2 mg Intravenous Q4H PRN Adhikari, Amrit, MD      . polyethylene glycol (MIRALAX / GLYCOLAX) packet 17 g  17 g Oral Daily Adhikari, Amrit, MD      . potassium chloride 10  mEq in 100 mL IVPB  10 mEq Intravenous Q1 Hr x 5 Adhikari, Amrit, MD 100 mL/hr at 10/29/18 0945 10 mEq at 10/29/18 0945  . sodium chloride flush (NS) 0.9 % injection 10-40 mL  10-40 mL Intracatheter PRN Shelly Coss, MD      . sulfacetamide (BLEPH-10) 10 % ophthalmic solution 2 drop  2 drop Both Eyes QID Volanda Napoleon, MD   2 drop at 10/29/18 0941    ROS:                                                                                                                                        unobtainable from patient due to mental status  Blood pressure (!) 190/76, pulse (!) 59, temperature 99.1 F (37.3 C), temperature source Oral, resp. rate 16, height 5\' 3"  (1.6 m), weight 60.5 kg, last menstrual period 12/26/2015, SpO2 99 %.  General Examination:  Physical Exam  HEENT-  Normocephalic, no lesions, without obvious abnormality.  Right eye with slight scleral injection ( per son has improved)  Cardiovascular-pulses palpable throughout   Lungs- no excessive working breathing.  Saturations within normal limits on RA Extremities- Warm, dry and intact Musculoskeletal-no joint tenderness, deformity or swelling Skin-warm and dry,intact  Neurological Examination Mental Status: Patient is obtunded. Did not arouse to verbal stimuli/ light touch or noxious stimuli.  Patient did eventually move BLE spontaneously. Did notice some occasional eye twitching. Did not move BUE to noxious or spontaneously. Cranial Nerves: PERRL Motor/Sensory: Able to move BLE spontaneously. Did not move to noxious stimuli. BUE did not move to noxious stimuli nor spontaneously.    Lab Results: Basic Metabolic Panel: Recent Labs  Lab 10/27/18 1335 10/28/18 0329 10/29/18 0416  NA 142 140 140  K 4.2 3.6 3.1*  CL 104 111 113*  CO2 25 19* 21*  GLUCOSE 100* 131* 172*  BUN 36* 29* 25*  CREATININE 1.25* 0.89 0.84   CALCIUM 8.5* 7.4* 7.2*    CBC: Recent Labs  Lab 10/27/18 1335 10/28/18 0329 10/29/18 0416  WBC 4.3 3.3* 5.1  NEUTROABS 3.9  --  4.7  HGB 13.3 11.0* 10.1*  HCT 43.3 35.9* 33.5*  MCV 89.5 90.2 89.8  PLT 66* 65* 59*   UA: negative for UTI Blood cultures: no growth at 2 days.  Imaging: Ct Head Wo Contrast  Result Date: 10/27/2018 CLINICAL DATA:  78 year old female with altered mental status for the past several days. History of brain cancer with metastatic disease. EXAM: CT HEAD WITHOUT CONTRAST TECHNIQUE: Contiguous axial images were obtained from the base of the skull through the vertex without intravenous contrast. COMPARISON:  Most recent prior brain MRI 10/05/2018 FINDINGS: Brain: Masslike hypointensity within the inferior left temporal lobe and inferior aspect of the left frontal lobe are again noted consistent with known brain metastases and associated cytogenic edema. There are some Stipe old calcifications within the dominant masses in the left temporal lobe. Comparing across modalities to the prior MRI, there may be slightly more edema extending into the white matter of the inferior left frontal lobe. No new hydro cephalo assess or midline shift. No evidence of acute intracranial hemorrhage or infarct. Additional foci of periventricular, deep and subcortical white matter hypoattenuation are present bilaterally consistent with chronic microvascular ischemic white matter disease. Vascular: No hyperdense vessel or unexpected calcification. Skull: Subtle infiltrative lesion within the petrous portion of the right temporal bone consistent with known metastatic disease. Sinuses/Orbits: No acute finding. Other: None. IMPRESSION: 1. Perhaps slight interval increase in edema extending into the white matter of the inferior left frontal lobe when comparing across modalities to the prior MRI dated 10/05/2018. 2. Otherwise, similar appearance of metastatic disease within the left temporal lobe and the  petrous portion of the right temporal bone. 3. No evidence of intracranial hemorrhage, hydrocephalus or midline shift. Electronically Signed   By: Jacqulynn Cadet M.D.   On: 10/27/2018 16:00   Dg Chest Port 1 View  Result Date: 10/27/2018 CLINICAL DATA:  History of metastatic breast carcinoma with decreased responsiveness, initial encounter EXAM: PORTABLE CHEST 1 VIEW COMPARISON:  10/04/2018 FINDINGS: Cardiac shadow is stable. Left-sided chest wall port is again seen and stable. Postsurgical changes in the right breast and thoracolumbar spine are again seen and stable. Metastatic disease in the posterior left rib cage is noted similar to that seen on prior exams. No new focal infiltrate or effusion is seen. IMPRESSION: Changes consistent with  the patient's known history. No acute abnormality noted. Electronically Signed   By: Inez Catalina M.D.   On: 10/27/2018 13:41    Assessment: Michelle Bowen is an 78 y.o. 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 Plan is for patient to d/c to home in care of hospice tomorrow  10/30/2018.  1) goals of care discussion; potential feeding tube if she continues to not eat or drink. 2) EEG completed. The EEG tracings are characterized by generalized slowing which may suggest a general cerebral disturbance such as a metabolic encephalopathy, among other possibilities.  Cannot rule out the possibility of slowing related to normal drowse as well. No epileptiform activity is noted.  3) CT head: Perhaps slight interval increase in edema extending into the white matter of the inferior left frontal lobe when comparing across modalities to the prior MRI dated 10/05/2018. Otherwise, similar appearance of metastatic disease within the left temporal lobe and the petrous portion of the right temporal bone. No evidence of intracranial hemorrhage, hydrocephalus or midline shift.       Recommendations: --MRI brain w contrast can be considered, but is unlikely to change management --Consider NGT for nutrition --Metastatic brain cancer management per Oncology team.  --Agree with continuing Dexamethasone.   Laurey Morale, MSN, NP-C [facility] [phone]  I have seen and examined the patient. I have formulated the assessment and plan. 78 year old female with metastatic breast cancer with metastases to brain and bone. Presents with AMS. CT head reveals perhaps a slight interval increase in focal left frontal lobe edema. EEG with slowing but no electrographic seizures. Brain metastases management per Oncology team.  Electronically signed: Dr. Kerney Elbe 10/29/2018, 10:20 AM

## 2018-10-29 NOTE — Progress Notes (Signed)
EEG complete - results pending 

## 2018-10-29 NOTE — Progress Notes (Signed)
Palliative care progress note  Reason for consult: Goals of care in light of metastatic breast cancer  I met today with patient's son on multiple occasions throughout the day.  He reports that he understands that we may be at the end of his mother's life, but he needs to know that any reversible causes of her acute decline have been explored.  I was able to call and reach the patient's sister, Harmon Pier, who is named as her HCPOA on paperwork presented by her son.  We discussed her clinical course as well as next steps moving forward in her sister's care.  - Both patient's son and sister are in agreement with plan for input by neurology and if no likely reversible cause of her mental status change, plan will be to transition home with hospice.   - I called and discussed with patient's niece, who has been living with her and serving as her caregiver.   - Consult placed for care management to arrange transition home with hospice tomorrow.  Total time: 60 minutes Greater than 50%  of this time was spent counseling and coordinating care related to the above assessment and plan  Micheline Rough, MD [place] Team [phone] .

## 2018-10-29 NOTE — Consult Note (Signed)
Palliative Care Consult Note  Reason for Consult: Goals of care in light of metastatic breast cancer  Palliative medicine consult received.  Chart reviewed including personal review of pertinent labs and imaging.  Discussed with bedside RN.  I met today with patient and her son.  She does not open eyes or participate in conversation in initial evaluation.  I met with her son outside the room.  I introduced palliative care as specialized medical care for people living with serious illness. It focuses on providing relief from the symptoms and stress of a serious illness. The goal is to improve quality of life for both the patient and the family.  We discussed his understanding of her clinical course as well as wishes moving forward in her care.   Values and goals of care important to patient and family were attempted to be elicited.  Her son is an only child and reports that his mother has always been a stubborn, independent individual.  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  Her son reports that he is struggling with the fact that his mother seems to be acutely worse beginning earlier this week.  He relays her prior experience with cancer and his frustration with trying to get her closer to [facility] where he lives to better support her.    - We discussed difference between a aggressive medical intervention path and a palliative, comfort focused care path. Nathaneil Canary reports that he has spent the day at the bedside with her, and he is convinced that she is "trying to respond to me but just can't."  Reports she has been trying to open her eyes and with encouragement will answer with affirmative or negative nod or verbalization.  She did seem to respond to him with great encouragement on my examination following our meeting.  Nathaneil Canary feels that she needs to be seen by neurology and reports that he feels this is "from the radiation." - Nathaneil Canary is struggling with his mother's acute decline and states that he will  accept that she is dying if "someone can tell me what is going on and that it is not going to get any better."  He is struggling also with the fact that he has been told that her cancer is "highly treatable if she would try."  He reports that his cousin is advocating to take her home with hospice, and the more time he has spent with his mom, the more convinced he has become that she is "trying but something else is going on."  He fears, "she may be in there screaming 'Help me.  Don't give up and we just don't know it.' - Son requesting evaluation by neurologist and would like rad onc to weigh in prior to agreeing to home with hospice.  He is struggling with acute change and will benefit from consistent messages from care team regarding plan and prognosis.  Will reach out to primary in AM to discuss.  Total time: 100 minutes Greater than 50%  of this time was spent counseling and coordinating care related to the above assessment and plan.  Micheline Rough, MD [place] Team [phone]

## 2018-10-29 NOTE — TOC Progression Note (Signed)
Transition of Care [facility]) - Progression Note    Patient Details  Name: BRONTE SABADO MRN: [id number] Date of Birth: 11-23-1940  Transition of Care [facility]) CM/SW Contact  Julena Barbour, Juliann Pulse, RN Phone Number: 10/29/2018, 3:53 PM  Clinical Narrative: HCPOA patient's sister-Eva Yolanda Bonine [phone].Spoke to son(Douglas) in rm agree to d/c home w/hospice services-[facility] rep aware of d/c in am. Son concerned about f/c vs keeping her dry. Niece Dawayne Cirri c# [phone] also with many concerns about medicines-fentanyl patch, & narcotics, any medicines to be d/c on-please confirm route of dispensing. Hospice rep Nunzio Cory will admit on Sunday, & family all in agreement to d/c tomorrow. PTAR for transport home.      Expected Discharge Plan: Home w Hospice Care Barriers to Discharge: Continued Medical Work up  Expected Discharge Plan and Services Expected Discharge Plan: [place]   Discharge Planning Services: CM Consult Post Acute Care Choice: Hospice Living arrangements for the past 2 months: Single Family Home                                       Social Determinants of Health (SDOH) Interventions    Readmission Risk Interventions No flowsheet data found.

## 2018-10-29 NOTE — Progress Notes (Signed)
Unfortunately, it seems as if there is been a change in her son's overall impression as to how his mom is been cared for right now.  He is very worried that nothing is being done for her.  He seems to feel as if we are just forgetting about her and not trying to help her.  This is not acceptable to him.  He insists that measures be taken quickly to try to help figure out what is wrong with his mom.  When we talked yesterday and on Wednesday evening, it certainly appeared that he wanted to try to get her home and get hospice involved.  This is not the case now.  He wants immediate action to be taken to try to help his mom's condition.  I spent about 40 minutes or so with him this morning.  He is very firm on what he feels is appropriate for his mom's care.  He just is not seen that happen to date.  He wants to see radiation oncology.  He seems to think that it is the radiation that has caused this issue.  I told him that I really have not seen radiation do something like this to a patient.  It seems as if she was doing great 1 day and then just had a decline the next day.  He also expects neurology to see her today.  She is still not opening her eyes.  Her labs today again do not look all that bad.  We will see if we can get an MRI of her brain and see if this can show [place] anything.  She has not taken anything nutrition wise.  I did not talk to her about the possibility of putting a feeding tube down her into the stomach give her nutrition that way.  Her pre-albumin is not all that bad.  Her labs today show a sodium of 140 potassium 3.1.  Her blood sugar is up a little bit from the Decadron.  Her creatinine is 0.84.  Calcium is 7.2.  Albumin is 2.8.  Her liver functions are okay.  Her platelet count is 59,000.  White cell count 5.1.  I know we checked her TSH.  This is okay.  We did do a urine culture on her.  This is pending.  I would do what I can to try to help her son's wishes.  I know that  this will take several doctors to be involved to try to talk with him to try to help him through this.  I know it is incredibly hard on him being that he actually lives in [place] and is under stress from what is going on down there with the coronavirus.  I really am just "blindsided" by the different attitude that her son has this morning.  Again, we will do everything we can to honor his wishes and have him have some confidence in the care that his mom is getting at [facility]  [name], MD  Psalm 27:1

## 2018-10-29 NOTE — Telephone Encounter (Signed)
Attempted to call the patient's son.  Will try again as time permits.  [name]. Michelle Bowen, BSN

## 2018-10-29 NOTE — Telephone Encounter (Signed)
Called the patient's son, no answer on the number and unable to leave a voicemail.  Will re-attempt as time permits.  [name]. Leonie Green, BSN

## 2018-10-29 NOTE — Procedures (Signed)
ELECTROENCEPHALOGRAM REPORT   Patient: Michelle Bowen       Room #: 2549 EEG No. ID: [id number] Age: 78 y.o.        Sex: female Referring Physician: Tawanna Solo Report Date:  10/29/2018        Interpreting Physician: Alexis Goodell  History: [name] is an 78 y.o. female with altered mental status  Medications:  Decadron  Conditions of Recording:  This is a 21 channel routine scalp EEG performed with bipolar and monopolar montages arranged in accordance to the international 10/20 system of electrode placement. One channel was dedicated to EKG recording.  The patient is in the poorly responsive state.  Description:  The background activity is slow and poorly organized.  The maximum posterior background rhythm noted is 6 Hz.  There are a lack of faster frequencies in the central and temporal regions as well where theta rhthms predominate and faster rhythms are only noted rarely.  This slow rhythm is maintained throughout the recording and is diffusely distributed.  No epileptiform activity is noted.  Hyperventilation and intermittent photic stimulation were not performed but failed to illicit any change in the tracing.  (and elicits a symmetrical driving response but fails to elicit any abnormalities.)  IMPRESSION: This EEG is characterized by generalized slowing which may suggest a general cerebral disturbance such as a metabolic encephalopathy, among other possibilities.  Can not rule out the possibility of slowing related to normal drowse as well.  Clinical correlation recommended.  No epileptiform activity is noted.       Alexis Goodell, MD Neurology [phone] 10/29/2018, 5:55 PM

## 2018-10-29 NOTE — Progress Notes (Addendum)
PROGRESS NOTE    Michelle Bowen  YHC:[id number] DOB: 19-Feb-1941 DOA: 10/27/2018 PCP: Patient, No Pcp Per   Brief Narrative: Michelle Bowen is a 78 y.o. 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  Patient had been receiving her chemotherapy treatment for metastatic breast cancer by Dr. Marin Olp.  She was also following with palliative radiation therapy until last Friday.  Patient was unresponsive and was unable to provide any history.  All the information was gathered on phone from her niece and son.As per the niece, she was doing okay until last Monday.  Since then, she became progressively weak, lost her appetite, stopped eating or drinking and was lying on the bed all day.  Patient became unresponsive, did not open her eyes.  She was then brought to the emergency department  Patient was admitted for the management of altered mental status.  Assessment & Plan:   Principal Problem:   AMS (altered mental status) Active Problems:   Breast cancer metastasized to liver, unspecified laterality (HCC)  Altered mental status:Most likely  to her metastatic breast cancer to the brain.  CT finding as above.  S/p doses of Decadron.  Keep n.p.o. for now.  Continue to monitor mental status.  We will follow-up MRI of the brain.  Neurology consulted as per family..  Metastatic breast cancer: Metastatic disease to the brain, bone and liver.  Follows with Dr. Marin Olp.On kadcyla,Faslodex,Verzenio,Xgeva as per oncology notes.  Receiving palliative radiation therapy for CNS metastasis/bone metastasis.  Following Dr. Lisbeth Renshaw.  Last radiation therapy last Friday.  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  Hypertensive urgency: Blood pressure up.  Continue to monitor on telemetry.  Continue labetalol as needed.No H/O hypertension in the past.  Loss of  appetite/poor oral intake: Continue gentle IV fluids  Acute kidney injury: Associated with dehydration. Improved.  Pancytopenia: Most likely associated with her end-stage cancer and possibly chemotherapy/radiation therapy.  We will continue to monitor.  Hypokalemia: Being supplemented  Goals of care: Had extensive discussion with son at bed side again this morning.  His ideas have changed dramatically this morning.  He was wondering why his mother is still not awake.He demands answers before we decide on hospice.He was still on impression that radiation therapy should have cured his mothers's brain metastasis. I expressed my understanding on his concerns.  I respected his emotions.  But I gently explained that her mother's mental status is most likely from the advanced breast cancer that has metastasized to her brain, and she also has edema in her brain due to this process.This changes could be incurable.  We didnt think that she  will benefit from  neurology consultation but we will consider this as per his demands .   He has been constantly talking to other family members who have been suggesting alternative diagnoses.  I have reached out to patient relations/risk management, palliative care, neurology, oncology.  Radiation oncology has also been requested to discuss with the son today.         DVT prophylaxis: SCD Code Status:DNR Family Communication: Discussed with son at the bedside Disposition Plan: Residential hospice versus hospice at home   Consultants: Oncology, palliative care  Procedures: None Antimicrobials:  Anti-infectives (From admission, onward)   Start     Dose/Rate Route Frequency Ordered Stop   10/27/18 1700  fluconazole (DIFLUCAN) tablet  100 mg  Status:  Discontinued     100 mg Oral Daily 10/27/18 1646 10/27/18 1701      Subjective: Patient seen and examined the bedside this morning.  Remains unresponsive he is yesterday.  No changes on her mental status.   Lying on the bed.    Hypertensive but otherwise hemodynamically stable.  Objective: Vitals:   10/28/18 0538 10/28/18 1250 10/28/18 2249 10/29/18 0623  BP: (!) 177/89 (!) 196/83 (!) 182/72 (!) 190/76  Pulse: 63 (!) 58 (!) 58 (!) 59  Resp:  20 16 16   Temp:  98.6 F (37 C) 99.3 F (37.4 C) 99.1 F (37.3 C)  TempSrc:  Axillary Oral Oral  SpO2:  100% 98% 99%  Weight:      Height:        Intake/Output Summary (Last 24 hours) at 10/29/2018 1039 Last data filed at 10/29/2018 0654 Gross per 24 hour  Intake 0 ml  Output 900 ml  Net -900 ml   Filed Weights   10/27/18 1258 10/27/18 2009  Weight: 63 kg 60.5 kg    Examination:  General exam: unresponsive HEENT: Both eyes closed, left eye more firmly closed, subconjunctival hemorrhage on right eye Respiratory system: Bilateral equal air entry, normal vesicular breath sounds, no wheezes or crackles  Cardiovascular system: S1 & S2 heard, RRR. No JVD, murmurs, rubs, gallops or clicks. Gastrointestinal system: Abdomen is nondistended, soft and nontender. No organomegaly or masses felt. Normal bowel sounds heard. Central nervous system: Not Alert and oriented Extremities: No edema, no clubbing ,no cyanosis, distal peripheral pulses palpable. Skin: No rashes, lesions or ulcers,no icterus ,no pallor   Data Reviewed: I have personally reviewed following labs and imaging studies  CBC: Recent Labs  Lab 10/27/18 1335 10/28/18 0329 10/29/18 0416  WBC 4.3 3.3* 5.1  NEUTROABS 3.9  --  4.7  HGB 13.3 11.0* 10.1*  HCT 43.3 35.9* 33.5*  MCV 89.5 90.2 89.8  PLT 66* 65* 59*   Basic Metabolic Panel: Recent Labs  Lab 10/27/18 1335 10/28/18 0329 10/29/18 0416  NA 142 140 140  K 4.2 3.6 3.1*  CL 104 111 113*  CO2 25 19* 21*  GLUCOSE 100* 131* 172*  BUN 36* 29* 25*  CREATININE 1.25* 0.89 0.84  CALCIUM 8.5* 7.4* 7.2*   GFR: Estimated Creatinine Clearance: 47.1 mL/min (by C-G formula based on SCr of 0.84 mg/dL). Liver Function  Tests: Recent Labs  Lab 10/27/18 1335 10/29/18 0416  AST 49* 31  ALT 27 23  ALKPHOS 158* 122  BILITOT 1.7* 0.9  PROT 7.4 5.8*  ALBUMIN 3.3* 2.8*   No results for input(s): LIPASE, AMYLASE in the last 168 hours. No results for input(s): AMMONIA in the last 168 hours. Coagulation Profile: No results for input(s): INR, PROTIME in the last 168 hours. Cardiac Enzymes: No results for input(s): CKTOTAL, CKMB, CKMBINDEX, TROPONINI in the last 168 hours. BNP (last 3 results) No results for input(s): PROBNP in the last 8760 hours. HbA1C: No results for input(s): HGBA1C in the last 72 hours. CBG: Recent Labs  Lab 10/27/18 1319 10/27/18 1622  GLUCAP 96 90   Lipid Profile: No results for input(s): CHOL, HDL, LDLCALC, TRIG, CHOLHDL, LDLDIRECT in the last 72 hours. Thyroid Function Tests: Recent Labs    10/27/18 2050  TSH 1.037   Anemia Panel: No results for input(s): VITAMINB12, FOLATE, FERRITIN, TIBC, IRON, RETICCTPCT in the last 72 hours. Sepsis Labs: Recent Labs  Lab 10/27/18 1402 10/27/18 1630  LATICACIDVEN 1.7 1.2  Recent Results (from the past 240 hour(s))  Urine Culture     Status: None   Collection Time: 10/27/18  1:37 PM  Result Value Ref Range Status   Specimen Description   Final    URINE, RANDOM Performed at [facility] [address], [place], [place] [address]    Special Requests   Final    NONE Performed at [facility], [place] [address] [place], [place] [address]    Culture   Final    NO GROWTH Performed at [facility], [place] [address], [place], [place] [address]    Report Status 10/28/2018 FINAL  Final  SARS Coronavirus 2 (CEPHEID- Performed in [facility] hospital lab), Hosp Order     Status: None   Collection Time: 10/27/18  1:40 PM  Result Value Ref Range Status   SARS Coronavirus 2 NEGATIVE NEGATIVE Final    Comment: (NOTE) If result is NEGATIVE SARS-CoV-2 target nucleic acids are NOT  DETECTED. The SARS-CoV-2 RNA is generally detectable in upper and lower  respiratory specimens during the acute phase of infection. The lowest  concentration of SARS-CoV-2 viral copies this assay can detect is 250  copies / mL. A negative result does not preclude SARS-CoV-2 infection  and should not be used as the sole basis for treatment or other  patient management decisions.  A negative result may occur with  improper specimen collection / handling, submission of specimen other  than nasopharyngeal swab, presence of viral mutation(s) within the  areas targeted by this assay, and inadequate number of viral copies  (<250 copies / mL). A negative result must be combined with clinical  observations, patient history, and epidemiological information. If result is POSITIVE SARS-CoV-2 target nucleic acids are DETECTED. The SARS-CoV-2 RNA is generally detectable in upper and lower  respiratory specimens dur ing the acute phase of infection.  Positive  results are indicative of active infection with SARS-CoV-2.  Clinical  correlation with patient history and other diagnostic information is  necessary to determine patient infection status.  Positive results do  not rule out bacterial infection or co-infection with other viruses. If result is PRESUMPTIVE POSTIVE SARS-CoV-2 nucleic acids MAY BE PRESENT.   A presumptive positive result was obtained on the submitted specimen  and confirmed on repeat testing.  While 2019 novel coronavirus  (SARS-CoV-2) nucleic acids may be present in the submitted sample  additional confirmatory testing may be necessary for epidemiological  and / or clinical management purposes  to differentiate between  SARS-CoV-2 and other Sarbecovirus currently known to infect humans.  If clinically indicated additional testing with an alternate test  methodology [phone]) is advised. The SARS-CoV-2 RNA is generally  detectable in upper and lower respiratory sp ecimens during  the acute  phase of infection. The expected result is Negative. Fact Sheet for Patients:  StrictlyIdeas.no Fact Sheet for Healthcare Providers: BankingDealers.co.za This test is not yet approved or cleared by the [place] FDA and has been authorized for detection and/or diagnosis of SARS-CoV-2 by FDA under an Emergency Use Authorization (EUA).  This EUA will remain in effect (meaning this test can be used) for the duration of the COVID-19 declaration under Section 564(b)(1) of the Act, 21 U.S.C. section 360bbb-3(b)(1), unless the authorization is terminated or revoked sooner. Performed at [facility], [place] [address], [place], [place] [address]   Blood Culture (routine x 2)     Status: None (Preliminary result)   Collection Time: 10/27/18  1:50 PM  Result Value Ref Range Status   Specimen Description   Final    BLOOD PORTA CATH CHEST Performed at [facility], [address], [place], [place] [address]    Special Requests   Final    BOTTLES DRAWN AEROBIC AND ANAEROBIC Blood Culture adequate volume Performed at [facility] [address] [place], [place] [address]    Culture   Final    NO GROWTH 2 DAYS Performed at [facility] [address] [place], [place] [address]    Report Status PENDING  Incomplete  Blood Culture (routine x 2)     Status: None (Preliminary result)   Collection Time: 10/27/18  1:57 PM  Result Value Ref Range Status   Specimen Description   Final    BLOOD LEFT ANTECUBITAL Performed at [facility] [address], [place], [place] [address]    Special Requests   Final    BOTTLES DRAWN AEROBIC AND ANAEROBIC Blood Culture adequate volume Performed at [facility] [address] [place], [place] [address]    Culture   Final    NO GROWTH 2 DAYS Performed at [facility] [address], [place],  [place] [address]    Report Status PENDING  Incomplete         Radiology Studies: Ct Head Wo Contrast  Result Date: 10/27/2018 CLINICAL DATA:  78 year old female with altered mental status for the past several days. History of brain cancer with metastatic disease. EXAM: CT HEAD WITHOUT CONTRAST TECHNIQUE: Contiguous axial images were obtained from the base of the skull through the vertex without intravenous contrast. COMPARISON:  Most recent prior brain MRI 10/05/2018 FINDINGS: Brain: Masslike hypointensity within the inferior left temporal lobe and inferior aspect of the left frontal lobe are again noted consistent with known brain metastases and associated cytogenic edema. There are some Stipe old calcifications within the dominant masses in the left temporal lobe. Comparing across modalities to the prior MRI, there may be slightly more edema extending into the white matter of the inferior left frontal lobe. No new hydro cephalo assess or midline shift. No evidence of acute intracranial hemorrhage or infarct. Additional foci of periventricular, deep and subcortical white matter hypoattenuation are present bilaterally consistent with chronic microvascular ischemic white matter disease. Vascular: No hyperdense vessel or unexpected calcification. Skull: Subtle infiltrative lesion within the petrous portion of the right temporal bone consistent with known metastatic disease. Sinuses/Orbits: No acute finding. Other: None. IMPRESSION: 1. Perhaps slight interval increase in edema extending into the white matter of the inferior left frontal lobe when comparing across modalities to the prior MRI dated 10/05/2018. 2. Otherwise, similar appearance of metastatic disease within the left temporal lobe and the petrous portion of the right temporal bone. 3. No evidence of intracranial hemorrhage, hydrocephalus or midline shift. Electronically Signed   By: Jacqulynn Cadet M.D.   On: 10/27/2018 16:00   Dg Chest Port 1  View  Result Date: 10/27/2018 CLINICAL DATA:  History of metastatic breast carcinoma with decreased responsiveness, initial encounter EXAM: PORTABLE CHEST 1 VIEW COMPARISON:  10/04/2018 FINDINGS: Cardiac shadow is stable. Left-sided chest wall port is again seen and stable. Postsurgical changes in the right breast and thoracolumbar spine are again seen and stable. Metastatic disease in the posterior left rib cage is noted similar to that seen on prior exams. No new focal infiltrate or effusion is seen. IMPRESSION: Changes consistent with the patient's known  history. No acute abnormality noted. Electronically Signed   By: Inez Catalina M.D.   On: 10/27/2018 13:41        Scheduled Meds:  mouth rinse  15 mL Mouth Rinse BID   polyethylene glycol  17 g Oral Daily   sulfacetamide  2 drop Both Eyes QID   Continuous Infusions:  dexamethasone (DECADRON) IVPB ([facility]) 20 mg (10/28/18 1124)   dextrose 5 % and 0.9% NaCl 75 mL/hr at 10/29/18 0205   potassium chloride 10 mEq (10/29/18 0945)     LOS: 2 days    Time spent:35 mins. More than 50% of that time was spent in counseling and/or coordination of care.      Shelly Coss, MD [facility] Pager [phone]  If 7PM-7AM, please contact night-coverage [email or web address] Password [facility] 10/29/2018, 10:39 AM

## 2018-10-29 NOTE — Progress Notes (Signed)
SLP Cancellation Note  Patient Details Name: Michelle Bowen MRN: [id number] DOB: Dec 24, 1940   Cancelled treatment:        Spoke with RN over phone. She states pt is relatively the same as yesterday and not responsive to her. No sedation meds given. Defer swallow assessment until pt able to participate. ST will follow up tomorrow (ST will be on pager [phone] tomorrow).   Houston Siren 10/29/2018, 11:23 AM   Orbie Pyo Colvin Caroli.Ed Risk analyst [phone] Office [phone]

## 2018-10-29 NOTE — Progress Notes (Signed)
Radiation Oncology         [phone] ________________________________  Name: Michelle Bowen MRN: [id number]  Date: 10/27/2018  DOB: [birth year]    DIAGNOSIS: The primary encounter diagnosis was Failure to thrive in adult. 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   HISTORY OF PRESENT ILLNESS::Michelle Bowen is a 78 y.o. female who is seen for an initial consultation visit regarding the patient's diagnosis of metastatic breast cancer.  The patient recently has been receiving whole brain radiation treatment for her recent diagnosis of brain metastasis.  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  The patient has completed 8 out of 10 planned whole brain radiation treatments.  Earlier this week, the patient suffered a decline in status and was not able to complete her course of radiation treatment.  She has been sleeping without significant ongoing responsiveness over the last several days and came into the emergency room for further evaluation/management.  A CT scan of the head showed a possible increase in edema.  The patient has been started on a higher dose of steroids, currently receiving 20 mg daily IV.   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     PAST SURGICAL HISTORY: Past Surgical History:  Procedure Laterality Date   BREAST LUMPECTOMY WITH AXILLARY LYMPH NODE DISSECTION Right 01/17/2016   Procedure: RIGHT BREAST LUMPECTOMY WITH AXILLARY LYMPH NODE  DISSECTION;  Surgeon: [name], MD;  Location: [place];  Service: General;  Laterality: Right;   BREAST SURGERY Right 2001   sebaceous cyst removal   RADIOACTIVE SEED GUIDED EXCISIONAL BREAST BIOPSY Left 01/17/2016   Procedure: RADIOACTIVE SEED GUIDED EXCISIONAL LEFT BREAST BIOPSY;  Surgeon: [name], MD;  Location: [place];  Service: General;  Laterality: Left;   RE-EXCISION OF BREAST LUMPECTOMY Right 02/05/2016   Procedure: RE-EXCISION OF BREAST LUMPECTOMY AND REMOVAL OF NIPPLE;  Surgeon: [name], MD;  Location: [place];  Service: General;  Laterality: Right;   TONSILLECTOMY       FAMILY HISTORY: family history includes Breast cancer in some other family members; Breast cancer (age of onset: 74) in her sister.   SOCIAL HISTORY:  reports that she has quit smoking. She has never used smokeless tobacco. She reports current alcohol use. She reports that she does not use drugs.   ALLERGIES: Patient has no known allergies.   MEDICATIONS:  Current Facility-Administered Medications  Medication Dose Route Frequency Provider Last Rate Last Dose   dexamethasone (DECADRON) 20 mg in sodium chloride 0.9 % 50 mL IVPB  20 mg Intravenous Q24H Adhikari, Amrit, MD 208 mL/hr at 10/29/18 1348 20 mg at 10/29/18 1348   dextrose 5 %-0.9 % sodium chloride infusion   Intravenous Continuous Shelly Coss, MD 75 mL/hr at 10/29/18 0205     heparin lock flush 100 unit/mL  500 Units Intracatheter Once PRN Lacretia Leigh, MD       labetalol (NORMODYNE) injection 10 mg  10 mg Intravenous Q2H PRN Shelly Coss, MD   10 mg at 10/29/18 0630  MEDLINE mouth rinse  15 mL Mouth Rinse BID Shelly Coss, MD   15 mL at 10/28/18 2300   morphine 2 MG/ML injection 2 mg  2 mg Intravenous Q4H PRN Shelly Coss, MD       polyethylene glycol (MIRALAX / GLYCOLAX) packet 17 g  17 g Oral Daily Adhikari, Amrit, MD       potassium chloride 10 MEQ/100ML  IVPB            sodium chloride flush (NS) 0.9 % injection 10-40 mL  10-40 mL Intracatheter PRN Adhikari, Amrit, MD       sulfacetamide (BLEPH-10) 10 % ophthalmic solution 2 drop  2 drop Both Eyes QID Volanda Napoleon, MD   2 drop at 10/29/18 1458      PHYSICAL EXAM:  height is 5\' 3"  (1.6 m) and weight is 133 lb 6.1 oz (60.5 kg). Her axillary temperature is 99.6 F (37.6 C). Her blood pressure is 176/74 (abnormal) and her pulse is 61. Her respiration is 16 and oxygen saturation is 100%.   ECOG = 4 0 - Asymptomatic (Fully active, able to carry on all predisease activities without restriction)  1 - Symptomatic but completely ambulatory (Restricted in physically strenuous activity but ambulatory and able to carry out work of a light or sedentary nature. For example, light housework, office work)  2 - Symptomatic, <50% in bed during the day (Ambulatory and capable of all self care but unable to carry out any work activities. Up and about more than 50% of waking hours)  3 - Symptomatic, >50% in bed, but not bedbound (Capable of only limited self-care, confined to bed or chair 50% or more of waking hours)  4 - Bedbound (Completely disabled. Cannot carry on any self-care. Totally confined to bed or chair)  5 - Death   Eustace Pen MM, Creech RH, Tormey DC, et al. [phone]). "Toxicity and response criteria of the [facility] Group". [place] Oncol. 5 (6): 649-55  Lying in a hospital bed, no acute distress, somnolent without responsiveness to voice command   LABORATORY DATA:  Lab Results  Component Value Date   WBC 5.1 10/29/2018   HGB 10.1 (L) 10/29/2018   HCT 33.5 (L) 10/29/2018   MCV 89.8 10/29/2018   PLT 59 (L) 10/29/2018   Lab Results  Component Value Date   NA 140 10/29/2018   K 3.1 (L) 10/29/2018   CL 113 (H) 10/29/2018   CO2 21 (L) 10/29/2018   Lab Results  Component Value Date   ALT 23 10/29/2018   AST 31 10/29/2018   ALKPHOS 122 10/29/2018   BILITOT 0.9  10/29/2018      RADIOGRAPHY: X-ray Chest Pa And Lateral  Result Date: 10/04/2018 CLINICAL DATA:  Chest pain, metastatic breast cancer EXAM: CHEST - 2 VIEW COMPARISON:  None. FINDINGS: There is a left-sided Port-A-Cath in satisfactory position. There is no focal parenchymal opacity. There is no pleural effusion or pneumothorax. The heart and mediastinal contours are unremarkable. The osseous structures are unremarkable. IMPRESSION: No active cardiopulmonary disease. Electronically Signed   By: Kathreen Devoid   On: 10/04/2018 19:41   Ct Head Wo Contrast  Result Date: 10/27/2018 CLINICAL DATA:  78 year old female with altered mental status for the past several days. History of brain cancer with metastatic disease. EXAM: CT HEAD WITHOUT CONTRAST TECHNIQUE: Contiguous axial images were obtained from the base of the skull through the vertex without intravenous contrast. COMPARISON:  Most recent prior brain MRI 10/05/2018 FINDINGS:  Brain: Masslike hypointensity within the inferior left temporal lobe and inferior aspect of the left frontal lobe are again noted consistent with known brain metastases and associated cytogenic edema. There are some Stipe old calcifications within the dominant masses in the left temporal lobe. Comparing across modalities to the prior MRI, there may be slightly more edema extending into the white matter of the inferior left frontal lobe. No new hydro cephalo assess or midline shift. No evidence of acute intracranial hemorrhage or infarct. Additional foci of periventricular, deep and subcortical white matter hypoattenuation are present bilaterally consistent with chronic microvascular ischemic white matter disease. Vascular: No hyperdense vessel or unexpected calcification. Skull: Subtle infiltrative lesion within the petrous portion of the right temporal bone consistent with known metastatic disease. Sinuses/Orbits: No acute finding. Other: None. IMPRESSION: 1. Perhaps slight interval  increase in edema extending into the white matter of the inferior left frontal lobe when comparing across modalities to the prior MRI dated 10/05/2018. 2. Otherwise, similar appearance of metastatic disease within the left temporal lobe and the petrous portion of the right temporal bone. 3. No evidence of intracranial hemorrhage, hydrocephalus or midline shift. Electronically Signed   By: Jacqulynn Cadet M.D.   On: 10/27/2018 16:00   Ct Thoracic Spine Wo Contrast  Result Date: 10/04/2018 CLINICAL DATA:  Metastatic breast cancer EXAM: CT THORACIC AND LUMBAR SPINE WITHOUT CONTRAST TECHNIQUE: Multidetector CT imaging of the thoracic and lumbar spine was performed without contrast. Multiplanar CT image reconstructions were also generated. COMPARISON:  Chest CT 05/24/2018 FINDINGS: CT THORACIC SPINE FINDINGS Alignment: Normal Vertebrae: There is a compression deformity of T4 with approximately 50% anterior height loss is new compared to 05/24/2018. There is no retropulsion. There are partially sclerotic lesions at T6 and T8, less distinct than on the prior study. T8, there is increased lucency extending into both pedicles and the posterior elements. Paraspinal and other soft tissues: There are multiple rib lesions including expansile lesions of the left third and sixth ribs. There is a left lower lobe pulmonary nodule measuring 8 mm. Disc levels: There is no large thoracic disc herniation. No spinal canal stenosis. No high-grade foraminal narrowing. CT LUMBAR SPINE FINDINGS Segmentation: Standard Alignment: Normal alignment below the level of the T12-L2 fusion. Vertebrae: The status post L1 corpectomy with left lateral T12-L2 fusion. Sclerotic change of the L3 vertebral body with large superior endplate Schmorl's node is unchanged. Faint sclerosis within the lower anterior aspect of L5. Paraspinal and other soft tissues: Mild calcific aortic atherosclerosis. Disc levels: At the L1 level, there is ossification at  the posterior aspect of the corpectomy site that causes moderate narrowing of the left lateral recess and mild central spinal canal stenosis. There is moderate-to-severe narrowing of the left L1-2 neural foramen. There is no other lumbar spinal canal stenosis. At L5-S1, there is bilateral endplate spurring and mild disc bulge causing mild bilateral neural foraminal stenosis. IMPRESSION: CT THORACIC SPINE IMPRESSION 1. T4 compression fracture with 50% height loss, new compared to 05/24/2018. No associated retropulsion or spinal canal stenosis. 2. Sclerotic lesions at T6 and T8 are less distinct than on the prior study. These may be treated metastatic lesions. At T8, there is lucency that extends into both pedicles and the posterior elements which could predispose to pathologic fracture. 3. Multiple metastases to the ribs. CT LUMBAR SPINE IMPRESSION 1. T12-L2 fusion with L1 corpectomy. There is mild spinal canal stenosis at this level secondary to ossification at the posterior aspect of the corpectomy site. There is  also moderate left L1-2 neural foraminal stenosis. 2. Partially sclerotic appearance of L3 and L5 may indicate the presence of metastatic disease, but this is unchanged compared to 05/24/2018. Electronically Signed   By: Ulyses Jarred M.D.   On: 10/04/2018 18:51   Ct Lumbar Spine Wo Contrast  Result Date: 10/04/2018 CLINICAL DATA:  Metastatic breast cancer EXAM: CT THORACIC AND LUMBAR SPINE WITHOUT CONTRAST TECHNIQUE: Multidetector CT imaging of the thoracic and lumbar spine was performed without contrast. Multiplanar CT image reconstructions were also generated. COMPARISON:  Chest CT 05/24/2018 FINDINGS: CT THORACIC SPINE FINDINGS Alignment: Normal Vertebrae: There is a compression deformity of T4 with approximately 50% anterior height loss is new compared to 05/24/2018. There is no retropulsion. There are partially sclerotic lesions at T6 and T8, less distinct than on the prior study. T8, there is  increased lucency extending into both pedicles and the posterior elements. Paraspinal and other soft tissues: There are multiple rib lesions including expansile lesions of the left third and sixth ribs. There is a left lower lobe pulmonary nodule measuring 8 mm. Disc levels: There is no large thoracic disc herniation. No spinal canal stenosis. No high-grade foraminal narrowing. CT LUMBAR SPINE FINDINGS Segmentation: Standard Alignment: Normal alignment below the level of the T12-L2 fusion. Vertebrae: The status post L1 corpectomy with left lateral T12-L2 fusion. Sclerotic change of the L3 vertebral body with large superior endplate Schmorl's node is unchanged. Faint sclerosis within the lower anterior aspect of L5. Paraspinal and other soft tissues: Mild calcific aortic atherosclerosis. Disc levels: At the L1 level, there is ossification at the posterior aspect of the corpectomy site that causes moderate narrowing of the left lateral recess and mild central spinal canal stenosis. There is moderate-to-severe narrowing of the left L1-2 neural foramen. There is no other lumbar spinal canal stenosis. At L5-S1, there is bilateral endplate spurring and mild disc bulge causing mild bilateral neural foraminal stenosis. IMPRESSION: CT THORACIC SPINE IMPRESSION 1. T4 compression fracture with 50% height loss, new compared to 05/24/2018. No associated retropulsion or spinal canal stenosis. 2. Sclerotic lesions at T6 and T8 are less distinct than on the prior study. These may be treated metastatic lesions. At T8, there is lucency that extends into both pedicles and the posterior elements which could predispose to pathologic fracture. 3. Multiple metastases to the ribs. CT LUMBAR SPINE IMPRESSION 1. T12-L2 fusion with L1 corpectomy. There is mild spinal canal stenosis at this level secondary to ossification at the posterior aspect of the corpectomy site. There is also moderate left L1-2 neural foraminal stenosis. 2. Partially  sclerotic appearance of L3 and L5 may indicate the presence of metastatic disease, but this is unchanged compared to 05/24/2018. Electronically Signed   By: Ulyses Jarred M.D.   On: 10/04/2018 18:51   Mr Jeri Cos AG Contrast  Result Date: 10/05/2018 CLINICAL DATA:  Metastatic breast cancer. Right cranial nerve VII dysfunction. EXAM: MRI HEAD WITHOUT AND WITH CONTRAST TECHNIQUE: Multiplanar, multiecho pulse sequences of the brain and surrounding structures were obtained without and with intravenous contrast. CONTRAST:  6.5 mL Gadavist COMPARISON:  Head CT 06/27/2010 FINDINGS: Brain: An enhancing mass involves the temporal bone on the right, particularly the petrous apex. There is evidence of dural involvement along the anterior, posterior, and superior aspects of the petrous bone, and enhancement extends into the right internal auditory canal and along the course of the facial nerve including involvement of the geniculate ganglion and tympanic segment of the nerve. Abnormal enhancement extends to the  margins of Meckel's cave on the right. A heterogeneously enhancing mass anteriorly in the left middle cranial fossa has a broad interface with the dura and appears to be extra-axial, measuring 3.1 x 1.8 cm. A mixed cystic and solid mass posterior to this in the left temporal lobe appears to be intra-axial and measures 3.7 x 2.5 cm with moderately extensive surrounding vasogenic edema which extends into the white matter tracts in the subinsular and posterior basal ganglia regions. There is associated regional sulcal effacement and mild mass effect on the left cerebral peduncle. A small amount of susceptibility artifact within the mass may represent chronic blood products or mineralization. No acute infarct, midline shift, or extra-axial fluid collection is evident. Patchy T2 hyperintensities in the cerebral white matter bilaterally are nonspecific but compatible with moderately advanced chronic small vessel ischemic  disease. Mild cerebral atrophy is not greater than expected for age. Vascular: Major intracranial vascular flow voids are preserved. Skull and upper cervical spine: Left occipital and parietal skull lesions. Sinuses/Orbits: Unremarkable orbits. Minimal posterior left ethmoid air cell mucosal thickening. Small right mastoid effusion. Other: None. IMPRESSION: 1. Multiple intracranial masses consistent with metastatic disease. 2. A lesion in the petrous temporal bone on the right involves the internal auditory canal and facial nerve. 3. Mixed cystic and solid left temporal lobe metastasis with moderate vasogenic edema. 4. Likely dural-based metastasis anteriorly in the left middle cranial fossa. 5. Skull metastases. 6. Moderate chronic small vessel ischemic disease. Electronically Signed   By: Logan Bores M.D.   On: 10/05/2018 16:02   Mr Lumbar Spine W Wo Contrast  Result Date: 10/05/2018 CLINICAL DATA:  Metastatic breast cancer.  Prior L1 corpectomy. EXAM: MRI LUMBAR SPINE WITHOUT AND WITH CONTRAST TECHNIQUE: Multiplanar and multiecho pulse sequences of the lumbar spine were obtained without and with intravenous contrast. CONTRAST:  6.5 mL Gadavist intravenous contrast. COMPARISON:  CT lumbar spine from yesterday. CT abdomen pelvis dated May 24, 2018. FINDINGS: Segmentation:  Standard. Alignment:  Physiologic. Vertebrae: Prior L1 corpectomy with left lateral T12-L2 fusion. Subtle patchy decreased T2 and T1 marrow signal in the L3 and L5 vertebral bodies corresponds to the sclerosis seen on CT. No other focal bone lesion. Unchanged Schmorl's node involving the L3 superior endplate. No evidence of discitis. Conus medullaris and cauda equina: Conus extends to the L2-L3 level. Conus and cauda equina appear normal. No intradural enhancement. Paraspinal and other soft tissues: Prior bilateral sacroplasty. Unchanged small right renal cyst. Otherwise negative. Disc levels: T11-T12: Mild disc bulging.  No stenosis.  T12-L1:  Negative. L1-L2: Unchanged left eccentric heterotopic ossification posterior to the L1 corpectomy with resultant mild mass effect and slight deformity of the left ventral cord. Mild left-sided spinal canal stenosis. Mild left lateral recess stenosis. No neuroforaminal stenosis. L2-L3:  Mild disc bulging.  No stenosis. L3-L4:  Shallow broad-based posterior disc protrusion.  No stenosis. L4-L5: Shallow broad-based posterior disc protrusion eccentric to the left. Mild bilateral facet arthropathy. No stenosis. L5-S1: Mild disc bulging and bilateral facet arthropathy. Mild left neuroforaminal stenosis. No spinal canal or right neuroforaminal stenosis. IMPRESSION: 1. Prior L1 corpectomy with T12-L2 fusion. Unchanged heterotopic ossification along the left posterior aspect of the corpectomy site resulting in mild left-sided spinal canal stenosis with slight deformity of the left ventral cord. 2. Subtle patchy decreased marrow signal in the L3 and L5 vertebral bodies corresponds to the sclerosis seen on CT and remains consistent with osseous metastatic disease. 3. Mild multilevel lumbar spondylosis as described above. Electronically Signed  By: Titus Dubin M.D.   On: 10/05/2018 15:33   Ct Abdomen Pelvis W Contrast  Result Date: 10/08/2018 CLINICAL DATA:  78 year old female with history of breast cancer. Staging examination. EXAM: CT ABDOMEN AND PELVIS WITH CONTRAST TECHNIQUE: Multidetector CT imaging of the abdomen and pelvis was performed using the standard protocol following bolus administration of intravenous contrast. CONTRAST:  117mL OMNIPAQUE IOHEXOL 300 MG/ML  SOLN COMPARISON:  CT the chest, abdomen and pelvis 05/24/2018. FINDINGS: Lower chest: Postradiation changes in the subpleural aspect of the anterior right upper lobe, similar to priors examinations, most compatible with chronic postradiation changes. Postoperative changes of lumpectomy in the right breast are again noted. Scattered areas of  linear scarring are noted throughout both lungs. Previously noted nodule in the superior segment of the left lower lobe abutting the major fissure has increased in size (axial image 16 of series 4) currently measuring 10 x 8 mm (previously 6 x 6 mm on 05/24/2018). Hepatobiliary: 3.0 x 3.0 cm hypovascular mass in segment 7 of the liver (axial image 26 of series 2), markedly increased in size compared to the prior examination (previously 1 cm). No other definite hepatic lesions are identified on today's examination. No intra or extrahepatic biliary ductal dilatation. Gallbladder is normal in appearance. Pancreas: No pancreatic mass. No pancreatic ductal dilatation. No pancreatic or peripancreatic fluid or inflammatory changes. Spleen: Unremarkable. Adrenals/Urinary Tract: Tiny subcentimeter low-attenuation lesion in the interpolar region of the right kidney. Left kidney and bilateral adrenal glands are normal in appearance. No hydroureteronephrosis. Urinary bladder is normal in appearance. Stomach/Bowel: Normal appearance of the stomach. No pathologic dilatation of small bowel or colon. Numerous colonic diverticulae are noted, without surrounding inflammatory changes to suggest an acute diverticulitis at this time. Normal appendix. Vascular/Lymphatic: Aortic atherosclerosis, without evidence of aneurysm or dissection in the abdominal or pelvic vasculature. No lymphadenopathy noted in the abdomen or pelvis. Reproductive: Thickening of the endometrium in the fundus of the uterus measuring up to 17 mm. Ovaries are unremarkable in appearance. Other: No significant volume of ascites.  No pneumoperitoneum. Musculoskeletal: Multiple mixed lytic and sclerotic lesions are again noted widely spread throughout the visualized axial and appendicular skeleton. Many of these lesions appear progressive compared to prior examinations, concerning for progressive metastatic disease. A specific example of this is a expansile  predominantly lytic lesion in the posterior aspect of a mid left rib (likely left seventh rib) which was previously sclerotic and nonexpansile (axial image 8 of series 2) where there appears to be a pathologic fracture. Increasingly expansile and lytic appearance of the head and neck of the right ninth rib also noted. Enlarging mildly expansile lytic lesion in the left inferior pubic ramus (axial image 91 of series 2) which was previously a subtle area of sclerosis. IMPRESSION: 1. Today's study demonstrates progressive metastatic disease in the chest, abdomen and pelvis, most notable for progression of osseous metastases, as well as enlarging pulmonary nodule in the superior segment of the left lower lobe, and enlarging hypovascular lesion in segment 7 of the liver. 2. Additional incidental findings, as above. Electronically Signed   By: Vinnie Langton M.D.   On: 10/08/2018 13:25   Dg Chest Port 1 View  Result Date: 10/27/2018 CLINICAL DATA:  History of metastatic breast carcinoma with decreased responsiveness, initial encounter EXAM: PORTABLE CHEST 1 VIEW COMPARISON:  10/04/2018 FINDINGS: Cardiac shadow is stable. Left-sided chest wall port is again seen and stable. Postsurgical changes in the right breast and thoracolumbar spine are again seen and  stable. Metastatic disease in the posterior left rib cage is noted similar to that seen on prior exams. No new focal infiltrate or effusion is seen. IMPRESSION: Changes consistent with the patient's known history. No acute abnormality noted. Electronically Signed   By: Inez Catalina M.D.   On: 10/27/2018 13:41       IMPRESSION/PLAN:  The patient unfortunately has had a decline in her overall status with a significant mental status change.  I discussed with the patient's son in some detail the current state we are in.  I did show him pictures of the intracranial metastases on her last MRI scan prior to beginning treatment.  2 of the areas which are adjacent to  each other are fairly bulky.  I do not believe that it is likely that radiation treatment alone has prompted this change in status.  Rather, I fear that a lack of effectiveness of the treatment in the setting of the degree of intracranial disease is more responsible for where we are.  Given the CT scan findings and the size of the tumors, I believe it is reasonable to continue the current dose of steroids and see if this has a greater effect over the next several days.  Certainly, edema can slowly subside and it may be possible for the patient to regain an improve level of consciousness if this were to occur.  I discussed with the patient's son that I do not feel strongly that completing the additional 2 remaining radiation treatments would significantly alter the patient's situation.  Nor would I advocate for a longer course of treatment beyond the standard 10 fractions to a dose of 30 Gy.  The patient has been seen by medical oncology and is going to be evaluated by neurology.  I think it is reasonable to look for any correctable causes of the patient's mental status change.  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 However, giving the steroids additional time to work and continuing to further evaluate the patient for other issues over the next couple of days would be reasonable.  If she does not substantially recover by early next week however, then I believe it will be increasingly likely that the patient's status represents irreversible progression of disease. The patient's son appears open to hospice if she does not significantly improve due to additional evaluation/ management/ giving steroids more time to work.  There is no clear current role at this time for additional radiation treatment.  We will be available if there are any additional questions or concerns.    ________________________________   Jodelle Gross, MD, PhD   **Disclaimer: This note  was dictated with voice recognition software. Similar sounding words can inadvertently be transcribed and this note may contain transcription errors which may not have been corrected upon publication of note.**

## 2018-10-30 LAB — CBC WITH DIFFERENTIAL/PLATELET
Abs Immature Granulocytes: 0.02 10*3/uL (ref 0.00–0.07)
Basophils Absolute: 0 10*3/uL (ref 0.0–0.1)
Basophils Relative: 0 %
Eosinophils Absolute: 0 10*3/uL (ref 0.0–0.5)
Eosinophils Relative: 0 %
HCT: 33.2 % — ABNORMAL LOW (ref 36.0–46.0)
Hemoglobin: 10.4 g/dL — ABNORMAL LOW (ref 12.0–15.0)
Immature Granulocytes: 0 %
Lymphocytes Relative: 2 %
Lymphs Abs: 0.1 10*3/uL — ABNORMAL LOW (ref 0.7–4.0)
MCH: 27.9 pg (ref 26.0–34.0)
MCHC: 31.3 g/dL (ref 30.0–36.0)
MCV: 89 fL (ref 80.0–100.0)
Monocytes Absolute: 0.2 10*3/uL (ref 0.1–1.0)
Monocytes Relative: 4 %
Neutro Abs: 5.3 10*3/uL (ref 1.7–7.7)
Neutrophils Relative %: 94 %
Platelets: 58 10*3/uL — ABNORMAL LOW (ref 150–400)
RBC: 3.73 MIL/uL — ABNORMAL LOW (ref 3.87–5.11)
RDW: 20.7 % — ABNORMAL HIGH (ref 11.5–15.5)
WBC: 5.6 10*3/uL (ref 4.0–10.5)
nRBC: 0 % (ref 0.0–0.2)

## 2018-10-30 LAB — COMPREHENSIVE METABOLIC PANEL
ALT: 22 U/L (ref 0–44)
AST: 31 U/L (ref 15–41)
Albumin: 2.7 g/dL — ABNORMAL LOW (ref 3.5–5.0)
Alkaline Phosphatase: 121 U/L (ref 38–126)
Anion gap: 10 (ref 5–15)
BUN: 20 mg/dL (ref 8–23)
CO2: 22 mmol/L (ref 22–32)
Calcium: 7.3 mg/dL — ABNORMAL LOW (ref 8.9–10.3)
Chloride: 106 mmol/L (ref 98–111)
Creatinine, Ser: 0.76 mg/dL (ref 0.44–1.00)
GFR calc Af Amer: >60 mL/min (ref >60)
GFR calc non Af Amer: >60 mL/min (ref >60)
Glucose, Bld: 145 mg/dL — ABNORMAL HIGH (ref 70–99)
Potassium: 3.1 mmol/L — ABNORMAL LOW (ref 3.5–5.1)
Sodium: 138 mmol/L (ref 135–145)
Total Bilirubin: 1.3 mg/dL — ABNORMAL HIGH (ref 0.3–1.2)
Total Protein: 5.6 g/dL — ABNORMAL LOW (ref 6.5–8.1)

## 2018-10-30 LAB — URINE CULTURE: Culture: >=100000 — AB

## 2018-10-30 LAB — MAGNESIUM: Magnesium: 2.2 mg/dL (ref 1.7–2.4)

## 2018-10-30 MED ORDER — FLUCONAZOLE 40 MG/ML PO SUSR
200.0000 mg | Freq: Every day | ORAL | Status: DC
  Filled 2018-10-30: qty 5

## 2018-10-30 MED ORDER — DEXAMETHASONE 1 MG/ML PO CONC
10.0000 mg | Freq: Two times a day (BID) | ORAL | Status: DC
  Filled 2018-10-30: qty 10

## 2018-10-30 MED ORDER — CIPROFLOXACIN 500 MG/5ML (10%) PO SUSR: 500.0000 mg | Freq: Two times a day (BID) | ORAL | Status: DC

## 2018-10-30 MED ORDER — DEXAMETHASONE SODIUM PHOSPHATE 10 MG/ML IJ SOLN: 20.0000 mg | Freq: Once | INTRAMUSCULAR | Status: DC

## 2018-10-30 MED ORDER — AMLODIPINE BESYLATE 10 MG PO TABS: 10.0000 mg | ORAL_TABLET | Freq: Every day | ORAL | Status: DC

## 2018-10-30 MED ORDER — SODIUM CHLORIDE 0.9 % IV SOLN
20.0000 mg | Freq: Once | INTRAVENOUS | Status: AC
  Administered 2018-10-30: 20 mg via INTRAVENOUS
  Filled 2018-10-30: qty 2

## 2018-10-30 MED ORDER — AMLODIPINE 1 MG/ML ORAL SUSPENSION: 10.0000 mg | Freq: Every day | ORAL | Status: DC

## 2018-10-30 MED ORDER — HEPARIN SOD (PORK) LOCK FLUSH 100 UNIT/ML IV SOLN
500.0000 [IU] | INTRAVENOUS | Status: AC | PRN
  Administered 2018-10-30: 500 [IU]

## 2018-10-30 MED ORDER — FENTANYL 25 MCG/HR TD PT72
1.0000 | MEDICATED_PATCH | TRANSDERMAL | Status: DC
  Administered 2018-10-30: 1 via TRANSDERMAL
  Filled 2018-10-30: qty 1

## 2018-10-30 MED ORDER — SODIUM CHLORIDE 0.9 % IV SOLN
1.0000 g | INTRAVENOUS | Status: DC
  Administered 2018-10-30: 1 g via INTRAVENOUS
  Filled 2018-10-30: qty 1

## 2018-10-30 MED ORDER — FLUCONAZOLE 40 MG/ML PO SUSR: 200.0000 mg | Freq: Every day | ORAL | 0 refills | Status: DC

## 2018-10-30 MED ORDER — PANTOPRAZOLE SODIUM 40 MG PO PACK
40.0000 mg | PACK | Freq: Every day | ORAL | Status: DC
  Filled 2018-10-30: qty 20

## 2018-10-30 MED ORDER — POTASSIUM CHLORIDE 10 MEQ/100ML IV SOLN
10.0000 meq | INTRAVENOUS | Status: AC
  Administered 2018-10-30: 10 meq via INTRAVENOUS
  Filled 2018-10-30 (×2): qty 100

## 2018-10-30 MED ORDER — LORAZEPAM 2 MG/ML PO CONC: 1.0000 mg | ORAL | 0 refills | Status: DC | PRN

## 2018-10-30 MED ORDER — FENTANYL 25 MCG/HR TD PT72: 1.0000 | MEDICATED_PATCH | TRANSDERMAL | 0 refills | Status: DC

## 2018-10-30 MED ORDER — POTASSIUM CHLORIDE 20 MEQ PO PACK: 40.0000 meq | PACK | Freq: Every day | ORAL | 0 refills | Status: DC

## 2018-10-30 MED ORDER — DEXAMETHASONE 1 MG/ML PO CONC: 10.0000 mg | Freq: Two times a day (BID) | ORAL | 0 refills | Status: DC

## 2018-10-30 MED ORDER — MORPHINE SULFATE 20 MG/5ML PO SOLN: 10.0000 mg | ORAL | 0 refills | Status: DC | PRN

## 2018-10-30 MED ORDER — HALOPERIDOL LACTATE 2 MG/ML PO CONC: 1.0000 mg | ORAL | 0 refills | Status: DC | PRN

## 2018-10-30 MED ORDER — POTASSIUM CHLORIDE 20 MEQ PO PACK: 40.0000 meq | PACK | Freq: Every day | ORAL | Status: DC

## 2018-10-30 MED ORDER — CIPROFLOXACIN HCL 500 MG PO TABS: 500.0000 mg | ORAL_TABLET | Freq: Two times a day (BID) | ORAL | Status: DC

## 2018-10-30 MED ORDER — CIPROFLOXACIN 500 MG/5ML (10%) PO SUSR: 500.0000 mg | Freq: Two times a day (BID) | ORAL | 0 refills | Status: DC

## 2018-10-30 MED ORDER — OXYCODONE HCL 20 MG/ML PO CONC: 10.0000 mg | ORAL | Status: DC | PRN

## 2018-10-30 NOTE — Progress Notes (Signed)
Patient experienced some difficulty swallowing oral liquid medications. Pt held medication in her mouth.  Provider paged with update.

## 2018-10-30 NOTE — Progress Notes (Signed)
Patient discharged, no change from am assessment. Pt a&o self, situation. stable out of the bed 1 assist. Son at bedside. Discharge instructions reviewed. Additional questions concern denied. Hard copy prescriptions provided.

## 2018-10-30 NOTE — Discharge Summary (Addendum)
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 Marin Bowen.She was also following with palliativeradiation therapy until last Friday.Patient was unresponsive and was unable to provide any history.Since then, she became progressively weak,lost her appetite,stopped eating or drinking and was lying on the bed all day.Patient became unresponsive, did not open her eyes.She was then brought to the emergency department .Patient was admitted for the management of altered mental status. Patient was a started on Decadron for cerebral edema.  She remained unresponsive for 48 hours.  Oncology, palliative care were following.  This morning she was alert and awake and was able to communicate.  After extensive discussion with the family, decision was made to discharge her today to home with hospice.  Following problems were addressed during her hospitalization:  Altered mental status:Most likely  to her metastatic breast cancer to the brain with cerebral edema.CT finding as above.Continue Decadron.  Neurology consulted as per family.  EEG did not show any seizure activity.  Metastatic breast cancer: Metastatic disease to the brain, bone and liver. Follows with Dr. Marin Bowen.On kadcyla,Faslodex,Verzenio,Xgeva as peroncology notes.Receiving palliative radiation therapy for CNS metastasis/bone metastasis.Following  Dr. Lisbeth Bowen. Last radiation therapy last Friday. 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  Hypertensive : Blood pressure up.Continue her home medication irbesartan.  Loss of appetite/poor oral intake:Was on  gentle IV fluids.  She was not able to tolerate any food due to her mental status.  Though her mental status improved today, she and her family do not want any speech therapy evaluation.  Acute kidney injury: Associated with dehydration. Improved.  Pancytopenia: Most likely associated with her end-stage cancer and possibly chemotherapy/radiation therapy.    Urinary tract infection: Urine urine culture showed Michelle Bowen, pansensitive.  Started on antibiotics.  Hypokalemia: Being supplemented  Goals of care:Several discussions held with family at the bedside.  Patient was seen by palliative care, oncology, radiation oncology, neurology.  Mental status has spontaneously improved this morning.  She is able to communicate and is  alert and awake.  Family wants to take her home today with hospice to follow at home.  Discharge Diagnoses:  Principal Problem:   AMS (altered mental status) Active Problems:   Breast cancer metastasized to liver, unspecified laterality [facility])    Discharge Instructions  Discharge Instructions    Diet - low sodium heart healthy   Complete by:  As directed    Discharge instructions   Complete by:  As directed    1)Take prescribed medications as instructed. 2)Follow up with Hospice at home.   Increase activity slowly   Complete by:  As directed      Allergies as of 10/30/2018   No Known Allergies     Medication List    STOP taking these medications   dexamethasone 4 MG tablet Commonly known as:  DECADRON Replaced by:  dexamethasone 1 MG/ML solution   fentaNYL 12 MCG/HR Commonly known as:  DURAGESIC Replaced by:  fentaNYL 25 MCG/HR   fluconazole 100 MG tablet Commonly known as:  DIFLUCAN Replaced by:  fluconazole 40 MG/ML  suspension   irbesartan 75 MG tablet Commonly known as:  AVAPRO   letrozole 2.5 MG tablet Commonly known as:  FEMARA   oxyCODONE 5 MG immediate release tablet Commonly known as:  Oxy IR/ROXICODONE   temazepam 7.5 MG capsule Commonly known as:  RESTORIL     TAKE these medications   ciprofloxacin 500 MG/5ML (10%) suspension Commonly known as:  CIPRO Take 5 mLs (500 mg total) by mouth 2 (two) times daily for 4 days. Start taking on:  Oct 31, 2018   dexamethasone 1 MG/ML solution Commonly known as:  DECADRON Take 10 mLs (10 mg total) by mouth every 12 (twelve) hours for 30 days. Replaces:  dexamethasone 4 MG tablet   famotidine 40 MG tablet Commonly known as:  PEPCID Take 1 tablet (40 mg total) by mouth at bedtime.   fentaNYL 25 MCG/HR Commonly known as:  [place] 1 patch onto the skin every 3 (three) days. Replaces:  fentaNYL 12 MCG/HR   fluconazole 40 MG/ML suspension Commonly known as:  DIFLUCAN Take 5 mLs (200 mg total) by mouth daily for 7 days. Start taking on:  Oct 31, 2018 Replaces:  fluconazole 100 MG tablet   gabapentin 300 MG capsule Commonly known as:  NEURONTIN Take 1 capsule (300 mg total) by mouth 3 (three) times daily.   haloperidol 2 MG/ML solution Commonly known as:  HALDOL Take 0.5 mLs (1 mg total) by mouth every 2 (two) hours as needed for agitation (or nausea).   LORazepam 2 MG/ML concentrated solution Commonly known as:  LORazepam Intensol Take 0.5 mLs (1 mg total) by mouth every 4 (four) hours as needed for anxiety.   memantine tablet pack Commonly known as:  NAMENDA TITRATION PACK Take 2 tablets by mouth daily.   morphine 20 MG/5ML solution Take 2.5 mLs (10 mg total) by mouth every 2 (two) hours as needed for pain (or shortness of breath).   polyethylene glycol 17 g packet Commonly known as:  MIRALAX / GLYCOLAX Take 17 g by mouth daily.   potassium chloride 20 MEQ packet Commonly known as:  KLOR-CON Take 40 mEq by mouth  daily for 5 days. Start taking on:  Oct 31, 2018   PROBIOTIC ACIDOPHILUS PO Take by mouth.   vitamin B-12 500 MCG tablet Commonly known as:  CYANOCOBALAMIN Take 500 mcg by mouth daily.   Vitamin D3 125 MCG (5000 UT) Tabs Take by mouth.      Follow-up Information    [facility] Follow up.   Why:  home visit on sunday 10/31/18 Contact information: [address] [place] [address] [phone]          No Known Allergies  Consultations:  Palliative care, oncology, neurology   Procedures/Studies: X-ray Chest Pa And Lateral  Result Date: 10/04/2018 CLINICAL DATA:  Chest pain, metastatic breast cancer EXAM: CHEST - 2 VIEW COMPARISON:  None. FINDINGS: There is a left-sided Port-A-Cath in satisfactory position. There is no focal parenchymal opacity. There is no pleural effusion or pneumothorax. The heart and mediastinal contours are unremarkable. The osseous structures are unremarkable. IMPRESSION: No active cardiopulmonary disease. Electronically Signed   By: Kathreen Devoid   On: 10/04/2018 19:41   Ct Head Wo Contrast  Result Date: 10/27/2018 CLINICAL DATA:  78 year old female with altered mental status for the past several days. History of brain cancer with metastatic disease. EXAM: CT HEAD WITHOUT CONTRAST TECHNIQUE: Contiguous axial images were obtained from the base of the  skull through the vertex without intravenous contrast. COMPARISON:  Most recent prior brain MRI 10/05/2018 FINDINGS: Brain: Masslike hypointensity within the inferior left temporal lobe and inferior aspect of the left frontal lobe are again noted consistent with known brain metastases and associated cytogenic edema. There are some St[place] old calcifications within the dominant masses in the left temporal lobe. Comparing across modalities to the prior MRI, there may be slightly more edema extending into the white matter of the inferior left frontal lobe. No new hydro cephalo assess or midline  shift. No evidence of acute intracranial hemorrhage or infarct. Additional foci of periventricular, deep and subcortical white matter hypoattenuation are present bilaterally consistent with chronic microvascular ischemic white matter disease. Vascular: No hyperdense vessel or unexpected calcification. Skull: Subtle infiltrative lesion within the petrous portion of the right temporal bone consistent with known metastatic disease. Sinuses/Orbits: No acute finding. Other: None. IMPRESSION: 1. Perhaps slight interval increase in edema extending into the white matter of the inferior left frontal lobe when comparing across modalities to the prior MRI dated 10/05/2018. 2. Otherwise, similar appearance of metastatic disease within the left temporal lobe and the petrous portion of the right temporal bone. 3. No evidence of intracranial hemorrhage, hydrocephalus or midline shift. Electronically Signed   By: Jacqulynn Cadet M.D.   On: 10/27/2018 16:00   Ct Thoracic Spine Wo Contrast  Result Date: 10/04/2018 CLINICAL DATA:  Metastatic breast cancer EXAM: CT THORACIC AND LUMBAR SPINE WITHOUT CONTRAST TECHNIQUE: Multidetector CT imaging of the thoracic and lumbar spine was performed without contrast. Multiplanar CT image reconstructions were also generated. COMPARISON:  Chest CT 05/24/2018 FINDINGS: CT THORACIC SPINE FINDINGS Alignment: Normal Vertebrae: There is a compression deformity of T4 with approximately 50% anterior height loss is new compared to 05/24/2018. There is no retropulsion. There are partially sclerotic lesions at T6 and T8, less distinct than on the prior study. T8, there is increased lucency extending into both pedicles and the posterior elements. Paraspinal and other soft tissues: There are multiple rib lesions including expansile lesions of the left third and sixth ribs. There is a left lower lobe pulmonary nodule measuring 8 mm. Disc levels: There is no large thoracic disc herniation. No spinal canal  stenosis. No high-grade foraminal narrowing. CT LUMBAR SPINE FINDINGS Segmentation: Standard Alignment: Normal alignment below the level of the T12-L2 fusion. Vertebrae: The status post L1 corpectomy with left lateral T12-L2 fusion. Sclerotic change of the L3 vertebral body with large superior endplate Schmorl's node is unchanged. Faint sclerosis within the lower anterior aspect of L5. Paraspinal and other soft tissues: Mild calcific aortic atherosclerosis. Disc levels: At the L1 level, there is ossification at the posterior aspect of the corpectomy site that causes moderate narrowing of the left lateral recess and mild central spinal canal stenosis. There is moderate-to-severe narrowing of the left L1-2 neural foramen. There is no other lumbar spinal canal stenosis. At L5-S1, there is bilateral endplate spurring and mild disc bulge causing mild bilateral neural foraminal stenosis. IMPRESSION: CT THORACIC SPINE IMPRESSION 1. T4 compression fracture with 50% height loss, new compared to 05/24/2018. No associated retropulsion or spinal canal stenosis. 2. Sclerotic lesions at T6 and T8 are less distinct than on the prior study. These may be treated metastatic lesions. At T8, there is lucency that extends into both pedicles and the posterior elements which could predispose to pathologic fracture. 3. Multiple metastases to the ribs. CT LUMBAR SPINE IMPRESSION 1. T12-L2 fusion with L1 corpectomy. There is mild spinal canal stenosis  at this level secondary to ossification at the posterior aspect of the corpectomy site. There is also moderate left L1-2 neural foraminal stenosis. 2. Partially sclerotic appearance of L3 and L5 may indicate the presence of metastatic disease, but this is unchanged compared to 05/24/2018. Electronically Signed   By: Ulyses Jarred M.D.   On: 10/04/2018 18:51   Ct Lumbar Spine Wo Contrast  Result Date: 10/04/2018 CLINICAL DATA:  Metastatic breast cancer EXAM: CT THORACIC AND LUMBAR SPINE  WITHOUT CONTRAST TECHNIQUE: Multidetector CT imaging of the thoracic and lumbar spine was performed without contrast. Multiplanar CT image reconstructions were also generated. COMPARISON:  Chest CT 05/24/2018 FINDINGS: CT THORACIC SPINE FINDINGS Alignment: Normal Vertebrae: There is a compression deformity of T4 with approximately 50% anterior height loss is new compared to 05/24/2018. There is no retropulsion. There are partially sclerotic lesions at T6 and T8, less distinct than on the prior study. T8, there is increased lucency extending into both pedicles and the posterior elements. Paraspinal and other soft tissues: There are multiple rib lesions including expansile lesions of the left third and sixth ribs. There is a left lower lobe pulmonary nodule measuring 8 mm. Disc levels: There is no large thoracic disc herniation. No spinal canal stenosis. No high-grade foraminal narrowing. CT LUMBAR SPINE FINDINGS Segmentation: Standard Alignment: Normal alignment below the level of the T12-L2 fusion. Vertebrae: The status post L1 corpectomy with left lateral T12-L2 fusion. Sclerotic change of the L3 vertebral body with large superior endplate Schmorl's node is unchanged. Faint sclerosis within the lower anterior aspect of L5. Paraspinal and other soft tissues: Mild calcific aortic atherosclerosis. Disc levels: At the L1 level, there is ossification at the posterior aspect of the corpectomy site that causes moderate narrowing of the left lateral recess and mild central spinal canal stenosis. There is moderate-to-severe narrowing of the left L1-2 neural foramen. There is no other lumbar spinal canal stenosis. At L5-S1, there is bilateral endplate spurring and mild disc bulge causing mild bilateral neural foraminal stenosis. IMPRESSION: CT THORACIC SPINE IMPRESSION 1. T4 compression fracture with 50% height loss, new compared to 05/24/2018. No associated retropulsion or spinal canal stenosis. 2. Sclerotic lesions at T6  and T8 are less distinct than on the prior study. These may be treated metastatic lesions. At T8, there is lucency that extends into both pedicles and the posterior elements which could predispose to pathologic fracture. 3. Multiple metastases to the ribs. CT LUMBAR SPINE IMPRESSION 1. T12-L2 fusion with L1 corpectomy. There is mild spinal canal stenosis at this level secondary to ossification at the posterior aspect of the corpectomy site. There is also moderate left L1-2 neural foraminal stenosis. 2. Partially sclerotic appearance of L3 and L5 may indicate the presence of metastatic disease, but this is unchanged compared to 05/24/2018. Electronically Signed   By: Ulyses Jarred M.D.   On: 10/04/2018 18:51   Mr Jeri Cos FU Contrast  Result Date: 10/05/2018 CLINICAL DATA:  Metastatic breast cancer. Right cranial nerve VII dysfunction. EXAM: MRI HEAD WITHOUT AND WITH CONTRAST TECHNIQUE: Multiplanar, multiecho pulse sequences of the brain and surrounding structures were obtained without and with intravenous contrast. CONTRAST:  6.5 mL Gadavist COMPARISON:  Head CT 06/27/2010 FINDINGS: Brain: An enhancing mass involves the temporal bone on the right, particularly the petrous apex. There is evidence of dural involvement along the anterior, posterior, and superior aspects of the petrous bone, and enhancement extends into the right internal auditory canal and along the course of the facial nerve including  involvement of the geniculate ganglion and tympanic segment of the nerve. Abnormal enhancement extends to the margins of Meckel's cave on the right. A heterogeneously enhancing mass anteriorly in the left middle cranial fossa has a broad interface with the dura and appears to be extra-axial, measuring 3.1 x 1.8 cm. A mixed cystic and solid mass posterior to this in the left temporal lobe appears to be intra-axial and measures 3.7 x 2.5 cm with moderately extensive surrounding vasogenic edema which extends into the  white matter tracts in the subinsular and posterior basal ganglia regions. There is associated regional sulcal effacement and mild mass effect on the left cerebral peduncle. A small amount of susceptibility artifact within the mass may represent chronic blood products or mineralization. No acute infarct, midline shift, or extra-axial fluid collection is evident. Patchy T2 hyperintensities in the cerebral white matter bilaterally are nonspecific but compatible with moderately advanced chronic small vessel ischemic disease. Mild cerebral atrophy is not greater than expected for age. Vascular: Major intracranial vascular flow voids are preserved. Skull and upper cervical spine: Left occipital and parietal skull lesions. Sinuses/Orbits: Unremarkable orbits. Minimal posterior left ethmoid air cell mucosal thickening. Small right mastoid effusion. Other: None. IMPRESSION: 1. Multiple intracranial masses consistent with metastatic disease. 2. A lesion in the petrous temporal bone on the right involves the internal auditory canal and facial nerve. 3. Mixed cystic and solid left temporal lobe metastasis with moderate vasogenic edema. 4. Likely dural-based metastasis anteriorly in the left middle cranial fossa. 5. Skull metastases. 6. Moderate chronic small vessel ischemic disease. Electronically Signed   By: Logan Bores M.D.   On: 10/05/2018 16:02   Mr Lumbar Spine W Wo Contrast  Result Date: 10/05/2018 CLINICAL DATA:  Metastatic breast cancer.  Prior L1 corpectomy. EXAM: MRI LUMBAR SPINE WITHOUT AND WITH CONTRAST TECHNIQUE: Multiplanar and multiecho pulse sequences of the lumbar spine were obtained without and with intravenous contrast. CONTRAST:  6.5 mL Gadavist intravenous contrast. COMPARISON:  CT lumbar spine from yesterday. CT abdomen pelvis dated May 24, 2018. FINDINGS: Segmentation:  Standard. Alignment:  Physiologic. Vertebrae: Prior L1 corpectomy with left lateral T12-L2 fusion. Subtle patchy decreased T2  and T1 marrow signal in the L3 and L5 vertebral bodies corresponds to the sclerosis seen on CT. No other focal bone lesion. Unchanged Schmorl's node involving the L3 superior endplate. No evidence of discitis. Conus medullaris and cauda equina: Conus extends to the L2-L3 level. Conus and cauda equina appear normal. No intradural enhancement. Paraspinal and other soft tissues: Prior bilateral sacroplasty. Unchanged small right renal cyst. Otherwise negative. Disc levels: T11-T12: Mild disc bulging.  No stenosis. T12-L1:  Negative. L1-L2: Unchanged left eccentric heterotopic ossification posterior to the L1 corpectomy with resultant mild mass effect and slight deformity of the left ventral cord. Mild left-sided spinal canal stenosis. Mild left lateral recess stenosis. No neuroforaminal stenosis. L2-L3:  Mild disc bulging.  No stenosis. L3-L4:  Shallow broad-based posterior disc protrusion.  No stenosis. L4-L5: Shallow broad-based posterior disc protrusion eccentric to the left. Mild bilateral facet arthropathy. No stenosis. L5-S1: Mild disc bulging and bilateral facet arthropathy. Mild left neuroforaminal stenosis. No spinal canal or right neuroforaminal stenosis. IMPRESSION: 1. Prior L1 corpectomy with T12-L2 fusion. Unchanged heterotopic ossification along the left posterior aspect of the corpectomy site resulting in mild left-sided spinal canal stenosis with slight deformity of the left ventral cord. 2. Subtle patchy decreased marrow signal in the L3 and L5 vertebral bodies corresponds to the sclerosis seen on CT and remains  consistent with osseous metastatic disease. 3. Mild multilevel lumbar spondylosis as described above. Electronically Signed   By: Titus Dubin M.D.   On: 10/05/2018 15:33   Ct Abdomen Pelvis W Contrast  Result Date: 10/08/2018 CLINICAL DATA:  78 year old female with history of breast cancer. Staging examination. EXAM: CT ABDOMEN AND PELVIS WITH CONTRAST TECHNIQUE: Multidetector CT  imaging of the abdomen and pelvis was performed using the standard protocol following bolus administration of intravenous contrast. CONTRAST:  138mL OMNIPAQUE IOHEXOL 300 MG/ML  SOLN COMPARISON:  CT the chest, abdomen and pelvis 05/24/2018. FINDINGS: Lower chest: Postradiation changes in the subpleural aspect of the anterior right upper lobe, similar to priors examinations, most compatible with chronic postradiation changes. Postoperative changes of lumpectomy in the right breast are again noted. Scattered areas of linear scarring are noted throughout both lungs. Previously noted nodule in the superior segment of the left lower lobe abutting the major fissure has increased in size (axial image 16 of series 4) currently measuring 10 x 8 mm (previously 6 x 6 mm on 05/24/2018). Hepatobiliary: 3.0 x 3.0 cm hypovascular mass in segment 7 of the liver (axial image 26 of series 2), markedly increased in size compared to the prior examination (previously 1 cm). No other definite hepatic lesions are identified on today's examination. No intra or extrahepatic biliary ductal dilatation. Gallbladder is normal in appearance. Pancreas: No pancreatic mass. No pancreatic ductal dilatation. No pancreatic or peripancreatic fluid or inflammatory changes. Spleen: Unremarkable. Adrenals/Urinary Tract: Tiny subcentimeter low-attenuation lesion in the interpolar region of the right kidney. Left kidney and bilateral adrenal glands are normal in appearance. No hydroureteronephrosis. Urinary bladder is normal in appearance. Stomach/Bowel: Normal appearance of the stomach. No pathologic dilatation of small bowel or colon. Numerous colonic diverticulae are noted, without surrounding inflammatory changes to suggest an acute diverticulitis at this time. Normal appendix. Vascular/Lymphatic: Aortic atherosclerosis, without evidence of aneurysm or dissection in the abdominal or pelvic vasculature. No lymphadenopathy noted in the abdomen or pelvis.  Reproductive: Thickening of the endometrium in the fundus of the uterus measuring up to 17 mm. Ovaries are unremarkable in appearance. Other: No significant volume of ascites.  No pneumoperitoneum. Musculoskeletal: Multiple mixed lytic and sclerotic lesions are again noted widely spread throughout the visualized axial and appendicular skeleton. Many of these lesions appear progressive compared to prior examinations, concerning for progressive metastatic disease. A specific example of this is a expansile predominantly lytic lesion in the posterior aspect of a mid left rib (likely left seventh rib) which was previously sclerotic and nonexpansile (axial image 8 of series 2) where there appears to be a pathologic fracture. Increasingly expansile and lytic appearance of the head and neck of the right ninth rib also noted. Enlarging mildly expansile lytic lesion in the left inferior pubic ramus (axial image 91 of series 2) which was previously a subtle area of sclerosis. IMPRESSION: 1. Today's study demonstrates progressive metastatic disease in the chest, abdomen and pelvis, most notable for progression of osseous metastases, as well as enlarging pulmonary nodule in the superior segment of the left lower lobe, and enlarging hypovascular lesion in segment 7 of the liver. 2. Additional incidental findings, as above. Electronically Signed   By: Vinnie Langton M.D.   On: 10/08/2018 13:25   Dg Chest Port 1 View  Result Date: 10/27/2018 CLINICAL DATA:  History of metastatic breast carcinoma with decreased responsiveness, initial encounter EXAM: PORTABLE CHEST 1 VIEW COMPARISON:  10/04/2018 FINDINGS: Cardiac shadow is stable. Left-sided chest wall port is  again seen and stable. Postsurgical changes in the right breast and thoracolumbar spine are again seen and stable. Metastatic disease in the posterior left rib cage is noted similar to that seen on prior exams. No new focal infiltrate or effusion is seen. IMPRESSION:  Changes consistent with the patient's known history. No acute abnormality noted. Electronically Signed   By: Inez Catalina M.D.   On: 10/27/2018 13:41       Subjective: Patient seen and examined at bedside this morning.  Looks comfortable.  Alert, awake.  Communicates.  Denies any distress.  Stable for discharge to home with hospice.  Discharge Exam: Vitals:   10/30/18 0631 10/30/18 0838  BP: (!) 163/73   Pulse: 65   Resp: 16   Temp: 99 F (37.2 C)   SpO2: 99% 95%   Vitals:   10/29/18 2243 10/30/18 0217 10/30/18 0631 10/30/18 0838  BP: (!) 185/79 (!) 175/82 (!) 163/73   Pulse: 63 61 65   Resp: 20  16   Temp: 98.8 F (37.1 C)  99 F (37.2 C)   TempSrc: Oral  Oral   SpO2: 98%  99% 95%  Weight:      Height:        General: Pt is alert, awake, not in acute distress Cardiovascular: RRR, S1/S2 +, no rubs, no gallops,chemo port Respiratory: CTA bilaterally, no wheezing, no rhonchi Abdominal: Soft, NT, ND, bowel sounds + Extremities: no edema, no cyanosis    The results of significant diagnostics from this hospitalization (including imaging, microbiology, ancillary and laboratory) are listed below for reference.     Microbiology: Recent Results (from the past 240 hour(s))  Urine Culture     Status: None   Collection Time: 10/27/18  1:37 PM  Result Value Ref Range Status   Specimen Description   Final    URINE, RANDOM Performed at [facility] [address], [place], [place] [address]    Special Requests   Final    NONE Performed at [facility], [place] [address], [place], [place] [address]    Culture   Final    NO GROWTH Performed at [facility], [place] [address], [place], [place] [address]    Report Status 10/28/2018 FINAL  Final  SARS Coronavirus 2 (CEPHEID- Performed in [place] hospital lab), Hosp Order     Status: None   Collection Time: 10/27/18  1:40 PM  Result Value Ref Range Status   SARS Coronavirus 2  NEGATIVE NEGATIVE Final    Comment: (NOTE) If result is NEGATIVE SARS-CoV-2 target nucleic acids are NOT DETECTED. The SARS-CoV-2 RNA is generally detectable in upper and lower  respiratory specimens during the acute phase of infection. The lowest  concentration of SARS-CoV-2 viral copies this assay can detect is 250  copies / mL. A negative result does not preclude SARS-CoV-2 infection  and should not be used as the sole basis for treatment or other  patient management decisions.  A negative result may occur with  improper specimen collection / handling, submission of specimen other  than nasopharyngeal swab, presence of viral mutation(s) within the  areas targeted by this assay, and inadequate number of viral copies  (<250 copies / mL). A negative result must be combined with clinical  observations, patient history, and epidemiological information. If result is POSITIVE SARS-CoV-2 target nucleic acids are DETECTED. The SARS-CoV-2 RNA is generally detectable in upper and lower  respiratory specimens dur ing the acute phase of infection.  Positive  results are indicative of active infection with SARS-CoV-2.  Clinical  correlation with patient history and other diagnostic information is  necessary to determine patient infection status.  Positive results do  not rule out bacterial infection or co-infection with other viruses. If result is PRESUMPTIVE POSTIVE SARS-CoV-2 nucleic acids MAY BE PRESENT.   A presumptive positive result was obtained on the submitted specimen  and confirmed on repeat testing.  While 2019 novel coronavirus  (SARS-CoV-2) nucleic acids may be present in the submitted sample  additional confirmatory testing may be necessary for epidemiological  and / or clinical management purposes  to differentiate between  SARS-CoV-2 and other Sarbecovirus currently known to infect humans.  If clinically indicated additional testing with an alternate test  methodology [phone]  is advised. The SARS-CoV-2 RNA is generally  detectable in upper and lower respiratory sp ecimens during the acute  phase of infection. The expected result is Negative. Fact Sheet for Patients:  StrictlyIdeas.no Fact Sheet for Healthcare Providers: BankingDealers.co.za This test is not yet approved or cleared by the [place] FDA and has been authorized for detection and/or diagnosis of SARS-CoV-2 by FDA under an Emergency Use Authorization (EUA).  This EUA will remain in effect (meaning this test can be used) for the duration of the COVID-19 declaration under Section 564(b)(1) of the Act, 21 U.S.C. section 360bbb-3(b)(1), unless the authorization is terminated or revoked sooner. Performed at [facility], [place] [address] [place], [place] [address]   Blood Culture (routine x 2)     Status: None (Preliminary result)   Collection Time: 10/27/18  1:50 PM  Result Value Ref Range Status   Specimen Description   Final    BLOOD PORTA CATH CHEST Performed at [facility], [place] [address], [place], [place] [address]    Special Requests   Final    BOTTLES DRAWN AEROBIC AND ANAEROBIC Blood Culture adequate volume Performed at [facility] [address] [place], [place] [address]    Culture   Final    NO GROWTH 2 DAYS Performed at [facility] [address], [place], [place] [address]    Report Status PENDING  Incomplete  Blood Culture (routine x 2)     Status: None (Preliminary result)   Collection Time: 10/27/18  1:57 PM  Result Value Ref Range Status   Specimen Description   Final    BLOOD LEFT ANTECUBITAL Performed at [facility] [address] [place], [place] [address]    Special Requests   Final    BOTTLES DRAWN AEROBIC AND ANAEROBIC Blood Culture adequate volume Performed at [facility] [address], [place], [place] [address]     Culture   Final    NO GROWTH 2 DAYS Performed at [facility] [address], [place], [place] [address]    Report Status PENDING  Incomplete  Urine Culture     Status: Abnormal   Collection Time: 10/28/18  6:38 PM  Result Value Ref Range Status   Specimen Description   Final    URINE, CATHETERIZED Performed at [facility] [address], [place], [place] [address]    Special Requests   Final    Immunocompromised Performed at [facility], [place] [address] [place], [place] [address]    Culture >=100,000 COLONIES/mL [facility] MORGANII (A)  Final   Report Status 10/30/2018 FINAL  Final   Organism ID, Bacteria Michelle  MORGANII (A)  Final      Susceptibility   Michelle morganii - MIC*    AMPICILLIN >=32 RESISTANT Resistant     CEFAZOLIN >=64 RESISTANT Resistant     CEFTRIAXONE <=1 SENSITIVE Sensitive     CIPROFLOXACIN <=0.25 SENSITIVE Sensitive     GENTAMICIN <=1 SENSITIVE Sensitive     IMIPENEM 1 SENSITIVE Sensitive     NITROFURANTOIN 128 RESISTANT Resistant     TRIMETH/SULFA <=20 SENSITIVE Sensitive     AMPICILLIN/SULBACTAM 16 INTERMEDIATE Intermediate     PIP/TAZO <=4 SENSITIVE Sensitive     * >=100,000 COLONIES/mL Michelle MORGANII     Labs: BNP (last 3 results) No results for input(s): BNP in the last 8760 hours. Basic Metabolic Panel: Recent Labs  Lab 10/27/18 1335 10/28/18 0329 10/29/18 0416 10/30/18 0509  NA 142 140 140 138  K 4.2 3.6 3.1* 3.1*  CL 104 111 113* 106  CO2 25 19* 21* 22  GLUCOSE 100* 131* 172* 145*  BUN 36* 29* 25* 20  CREATININE 1.25* 0.89 0.84 0.76  CALCIUM 8.5* 7.4* 7.2* 7.3*  MG  --   --   --  2.2   Liver Function Tests: Recent Labs  Lab 10/27/18 1335 10/29/18 0416 10/30/18 0509  AST 49* 31 31  ALT 27 23 22   ALKPHOS 158* 122 121  BILITOT 1.7* 0.9 1.3*  PROT 7.4 5.8* 5.6*  ALBUMIN 3.3* 2.8* 2.7*   No results for input(s): LIPASE, AMYLASE in the last 168 hours. No results for  input(s): AMMONIA in the last 168 hours. CBC: Recent Labs  Lab 10/27/18 1335 10/28/18 0329 10/29/18 0416 10/30/18 0509  WBC 4.3 3.3* 5.1 5.6  NEUTROABS 3.9  --  4.7 5.3  HGB 13.3 11.0* 10.1* 10.4*  HCT 43.3 35.9* 33.5* 33.2*  MCV 89.5 90.2 89.8 89.0  PLT 66* 65* 59* 58*   Cardiac Enzymes: No results for input(s): CKTOTAL, CKMB, CKMBINDEX, TROPONINI in the last 168 hours. BNP: Invalid input(s): POCBNP CBG: Recent Labs  Lab 10/27/18 1319 10/27/18 1622  GLUCAP 96 90   D-Dimer No results for input(s): DDIMER in the last 72 hours. Hgb A1c No results for input(s): HGBA1C in the last 72 hours. Lipid Profile No results for input(s): CHOL, HDL, LDLCALC, TRIG, CHOLHDL, LDLDIRECT in the last 72 hours. Thyroid function studies Recent Labs    10/27/18 2050  TSH 1.037   Anemia work up No results for input(s): VITAMINB12, FOLATE, FERRITIN, TIBC, IRON, RETICCTPCT in the last 72 hours. Urinalysis    Component Value Date/Time   COLORURINE YELLOW 10/27/2018 1337   APPEARANCEUR CLEAR 10/27/2018 1337   LABSPEC 1.019 10/27/2018 1337   PHURINE 7.0 10/27/2018 1337   GLUCOSEU NEGATIVE 10/27/2018 1337   HGBUR NEGATIVE 10/27/2018 1337   [place] 10/27/2018 1337   KETONESUR 20 (A) 10/27/2018 1337   PROTEINUR 30 (A) 10/27/2018 1337   NITRITE NEGATIVE 10/27/2018 1337   LEUKOCYTESUR NEGATIVE 10/27/2018 1337   Sepsis Labs Invalid input(s): PROCALCITONIN,  WBC,  LACTICIDVEN Microbiology Recent Results (from the past 240 hour(s))  Urine Culture     Status: None   Collection Time: 10/27/18  1:37 PM  Result Value Ref Range Status   Specimen Description   Final    URINE, RANDOM Performed at [facility] [address] [place], [place] [address]    Special Requests   Final    NONE Performed at [facility], [place] [address] [place], [place] [address]    Culture  Final    NO GROWTH Performed at [facility], [place] [address] [place], [place] [address]    Report Status 10/28/2018 FINAL  Final  SARS Coronavirus 2 (CEPHEID- Performed in [facility] hospital lab), Hosp Order     Status: None   Collection Time: 10/27/18  1:40 PM  Result Value Ref Range Status   SARS Coronavirus 2 NEGATIVE NEGATIVE Final    Comment: (NOTE) If result is NEGATIVE SARS-CoV-2 target nucleic acids are NOT DETECTED. The SARS-CoV-2 RNA is generally detectable in upper and lower  respiratory specimens during the acute phase of infection. The lowest  concentration of SARS-CoV-2 viral copies this assay can detect is 250  copies / mL. A negative result does not preclude SARS-CoV-2 infection  and should not be used as the sole basis for treatment or other  patient management decisions.  A negative result may occur with  improper specimen collection / handling, submission of specimen other  than nasopharyngeal swab, presence of viral mutation(s) within the  areas targeted by this assay, and inadequate number of viral copies  (<250 copies / mL). A negative result must be combined with clinical  observations, patient history, and epidemiological information. If result is POSITIVE SARS-CoV-2 target nucleic acids are DETECTED. The SARS-CoV-2 RNA is generally detectable in upper and lower  respiratory specimens dur ing the acute phase of infection.  Positive  results are indicative of active infection with SARS-CoV-2.  Clinical  correlation with patient history and other diagnostic information is  necessary to determine patient infection status.  Positive results do  not rule out bacterial infection or co-infection with other viruses. If result is PRESUMPTIVE POSTIVE SARS-CoV-2 nucleic acids MAY BE PRESENT.   A presumptive positive result was obtained on the submitted specimen  and confirmed on repeat testing.  While 2019 novel coronavirus  (SARS-CoV-2) nucleic acids may be present in the submitted sample  additional confirmatory testing may  be necessary for epidemiological  and / or clinical management purposes  to differentiate between  SARS-CoV-2 and other Sarbecovirus currently known to infect humans.  If clinically indicated additional testing with an alternate test  methodology [phone]) is advised. The SARS-CoV-2 RNA is generally  detectable in upper and lower respiratory sp ecimens during the acute  phase of infection. The expected result is Negative. Fact Sheet for Patients:  StrictlyIdeas.no Fact Sheet for Healthcare Providers: BankingDealers.co.za This test is not yet approved or cleared by the [place] FDA and has been authorized for detection and/or diagnosis of SARS-CoV-2 by FDA under an Emergency Use Authorization (EUA).  This EUA will remain in effect (meaning this test can be used) for the duration of the COVID-19 declaration under Section 564(b)(1) of the Act, 21 U.S.C. section 360bbb-3(b)(1), unless the authorization is terminated or revoked sooner. Performed at [facility], [place] [address], [place], [place] [address]   Blood Culture (routine x 2)     Status: None (Preliminary result)   Collection Time: 10/27/18  1:50 PM  Result Value Ref Range Status   Specimen Description   Final    BLOOD PORTA CATH CHEST Performed at [facility], [place] [address], [place], [place] [address]    Special Requests   Final    BOTTLES DRAWN AEROBIC AND ANAEROBIC Blood Culture adequate volume Performed at [facility] [address] [place], [place] [address]    Culture   Final    NO GROWTH 2 DAYS Performed at [facility], [place] [address], [place], [place] [address]    Report Status PENDING  Incomplete  Blood Culture (routine x 2)     Status: None (Preliminary result)   Collection Time: 10/27/18  1:57 PM  Result Value Ref Range Status   Specimen Description   Final    BLOOD LEFT ANTECUBITAL Performed at [facility] [address] [place], [place] [address]    Special Requests   Final    BOTTLES DRAWN AEROBIC AND ANAEROBIC Blood Culture adequate volume Performed at [facility] [address], [place], [place] [address]    Culture   Final    NO GROWTH 2 DAYS Performed at [facility] [address], [place], [place] [address]    Report Status PENDING  Incomplete  Urine Culture     Status: Abnormal   Collection Time: 10/28/18  6:38 PM  Result Value Ref Range Status   Specimen Description   Final    URINE, CATHETERIZED Performed at [facility] [address], [place], [place] [address]    Special Requests   Final    Immunocompromised Performed at [facility], [place] [address], [place], [place] [address]    Culture >=100,000 COLONIES/mL [facility] MORGANII (A)  Final   Report Status 10/30/2018 FINAL  Final   Organism ID, Bacteria Michelle MORGANII (A)  Final      Susceptibility   Michelle morganii - MIC*    AMPICILLIN >=32 RESISTANT Resistant     CEFAZOLIN >=64 RESISTANT Resistant     CEFTRIAXONE <=1 SENSITIVE Sensitive     CIPROFLOXACIN <=0.25 SENSITIVE Sensitive     GENTAMICIN <=1 SENSITIVE Sensitive     IMIPENEM 1 SENSITIVE Sensitive     NITROFURANTOIN 128 RESISTANT Resistant     TRIMETH/SULFA <=20 SENSITIVE Sensitive     AMPICILLIN/SULBACTAM 16 INTERMEDIATE Intermediate     PIP/TAZO <=4 SENSITIVE Sensitive     * >=100,000 COLONIES/mL Michelle MORGANII    Please note: You were cared for by a hospitalist during your hospital stay. Once you are discharged, your primary care physician will handle any further medical issues. Please note that NO REFILLS for any discharge medications will be authorized once you are discharged, as it is imperative that you return to your primary care physician (or establish a relationship with a primary care physician if you do not have one) for your post hospital  discharge needs so that they can reassess your need for medications and monitor your lab values.    Time coordinating discharge: 40 minutes  SIGNED:   Shelly Coss, MD  [facility] 10/30/2018, 10:26 AM Pager [phone]  If 7PM-7AM, please contact night-coverage [email or web address] Password [email or web address]

## 2018-10-30 NOTE — Progress Notes (Signed)
Palliative care progress note  Reason for consult: Goals of care in light of metastatic breast cancer  I met today with Michelle Bowen this AM.  He is hoping to get her home with hospice as soon as possible.  I reviewed care plan in detail with him, including plan for continuation of steroids and medications for comfort.  Michelle Bowen is more awake and alert this AM.  Denies complaints, including denying pain and SOB.  Reports she wants to go home.  Discussed with Dr. Tawanna Solo as well as liaison from hospice. Scripts written for ativan, fentanyl, and roxanol and placed on the chart.  Rest of med regimen per Dr. Tawanna Solo.  She did not take her oral dexamethasone this AM.  Plan for IV dose prior to discharge.   Total time: 40 minutes Greater than 50%  of this time was spent counseling and coordinating care related to the above assessment and plan  Michelle Rough, MD [place] Team [phone] .

## 2018-10-30 NOTE — Progress Notes (Signed)
Thankfully, the situation is much better this morning.  I very much appreciate everybody who helped out yesterday.  Her son is in a much better frame of mind.  He has a lot more peace about what is going on.  She had a EEG.  This was non-diagnostic.  There is no obvious seizure activity.  She is much more alert this morning.  She was talking a little bit to me.  Maybe, the steroids that we have given her is helping a little bit and there is some decreased brain swelling.  She wants to go home.  She will be going home today.  I think that her son will get her home.  The question is whether or not she will go home in a car or by ambulance.  She wants to go home by car.  She still is not eating.  She just does not want to eat.  We are going to have to get her medications shifted over to liquid form for when she goes home.  Her son says that she can take liquids much better than pills.  It looks like hospice will be involved.  Her labs still look okay.  There really is nothing with her labs that we need to address.  She does have some thrombocytopenia.  This is stable however.  I really do not think this is going to be an issue.  On her physical exam, her temperature is 99.  Pulse 65.  Blood pressure 163/73.  Her lungs are clear bilaterally.  Her oral exam is somewhat dry but there is no thrush.  Her cardiac exam is regular rate and rhythm.  Abdomen is soft.  Bowel sounds are present.  Extremities shows movement with upper and lower extremities.  She is weak bilaterally.  Again, the transition home is going to be critical.  We must, must make sure that all of her medicines are sent to her pharmacy.  She clearly needs the Decadron.  I also need to make sure that she is on an antacid.  I will also make sure she is on Diflucan.  She will need pain medication.  I ordered OxyFast.  Everything must be a liquid to help her take the medication.  Hopefully, she will stabilize at home.  I really think  that she can go a few weeks.  I think the real key is going to be whether or not she is going to eat.  I know that Hospice will really help out.  Please, please make sure that they will see her today when she gets home.  I, again, appreciate everybody's help with her.  [name], MD  [place] 18:14

## 2018-10-30 NOTE — Progress Notes (Signed)
Patient became awake and was responsive during shift change last evening. Son ran out of room stating that the patient was trying to get out of bed. Both nurses entered and found patient almost in a panic picking at sheets. Patient was wet with urine and visibly uncomfortable by it. Patient was cleaned by staff and has remained some what responsive during shift. Unable to answer questions but will repeat things and seems to follow directions. Blood pressure remains elevated. Will continue to monitor patient.

## 2018-11-01 ENCOUNTER — Telehealth: Payer: Self-pay | Admitting: *Deleted

## 2018-11-01 LAB — CULTURE, BLOOD (ROUTINE X 2)
Culture: NO GROWTH
Culture: NO GROWTH
Special Requests: ADEQUATE
Special Requests: ADEQUATE

## 2018-11-01 NOTE — Telephone Encounter (Signed)
Called patients son Michelle Bowen at the request of Dr. Marin Olp to inquire how patient was doing.  Patient very verbal.  States that his mom was actually doing a bit better.  Was eating a little more and seemed to have a better appetite.  Patient stated that he wanted his mother to get her treatment this week.  I inquired whether his mother was still with hospice because if so patient would not be able to be treated.  Michelle Bowen said that he was aware of this and he was going to stop hospice so that his mother could get treated.  He then demanded that his mother be able to bring his cousin to the appointment.  I reviewed the no visitor policy and the recent update which leaves visitor policy in place.  I also asked if Dr. Marin Olp was aware that he wanted her treated and if that was discussed as an option.  He stated that he has not discussed this with Dr. Marin Olp and that he wants to and will be waiting for his call.  Discussed this with Dr. Marin Olp states that he will call patient in the morning.  I reiterated the importance of him calling patient that he was expecting his call to discuss this.  I told Michelle Bowen that once it was established that patient would/could be treated after talking with Dr. Marin Olp then we would see what steps could be taken, if any, to allow the cousin to come in to office with [place].  Patient seemed satisfied with this.

## 2018-11-01 NOTE — Telephone Encounter (Signed)
Dr. Marin Olp asked that I call patient to see how she is doing post discharge from the hospital.  Called patient number on file.  Unable to leave message because mailbox is full.  Will attempt to call again.

## 2018-11-04 ENCOUNTER — Ambulatory Visit (HOSPITAL_BASED_OUTPATIENT_CLINIC_OR_DEPARTMENT_OTHER)
Admission: RE | Admit: 2018-11-04 | Discharge: 2018-11-04 | Disposition: A | Discharge status: Home / Self Care | Payer: Medicare Other | Source: Ambulatory Visit | Attending: Hematology & Oncology | Admitting: Hematology & Oncology

## 2018-11-04 ENCOUNTER — Inpatient Hospital Stay: Payer: Medicare Other | Attending: Hematology & Oncology

## 2018-11-04 ENCOUNTER — Other Ambulatory Visit: Payer: Self-pay | Admitting: Hematology & Oncology

## 2018-11-04 ENCOUNTER — Telehealth: Payer: Self-pay | Admitting: Hematology & Oncology

## 2018-11-04 ENCOUNTER — Inpatient Hospital Stay: Payer: Medicare Other

## 2018-11-04 ENCOUNTER — Inpatient Hospital Stay (HOSPITAL_BASED_OUTPATIENT_CLINIC_OR_DEPARTMENT_OTHER): Payer: Medicare Other | Admitting: Hematology & Oncology

## 2018-11-04 ENCOUNTER — Other Ambulatory Visit: Payer: Self-pay

## 2018-11-04 ENCOUNTER — Encounter: Payer: Self-pay | Admitting: Hematology & Oncology

## 2018-11-04 VITALS — BP 166/88 | HR 72 | Temp 98.1°F

## 2018-11-04 DIAGNOSIS — M79651 Pain in right thigh: Secondary | ICD-10-CM | POA: Insufficient documentation

## 2018-11-04 DIAGNOSIS — C7951 Secondary malignant neoplasm of bone: Secondary | ICD-10-CM

## 2018-11-04 DIAGNOSIS — C50411 Malignant neoplasm of upper-outer quadrant of right female breast: Secondary | ICD-10-CM

## 2018-11-04 DIAGNOSIS — D509 Iron deficiency anemia, unspecified: Secondary | ICD-10-CM | POA: Insufficient documentation

## 2018-11-04 DIAGNOSIS — C50911 Malignant neoplasm of unspecified site of right female breast: Secondary | ICD-10-CM | POA: Diagnosis present

## 2018-11-04 DIAGNOSIS — C50919 Malignant neoplasm of unspecified site of unspecified female breast: Secondary | ICD-10-CM

## 2018-11-04 DIAGNOSIS — Z79899 Other long term (current) drug therapy: Secondary | ICD-10-CM

## 2018-11-04 DIAGNOSIS — Z5112 Encounter for antineoplastic immunotherapy: Secondary | ICD-10-CM | POA: Diagnosis present

## 2018-11-04 DIAGNOSIS — C787 Secondary malignant neoplasm of liver and intrahepatic bile duct: Secondary | ICD-10-CM | POA: Insufficient documentation

## 2018-11-04 DIAGNOSIS — Z5111 Encounter for antineoplastic chemotherapy: Secondary | ICD-10-CM | POA: Diagnosis present

## 2018-11-04 DIAGNOSIS — D500 Iron deficiency anemia secondary to blood loss (chronic): Secondary | ICD-10-CM

## 2018-11-04 LAB — CMP (CANCER CENTER ONLY)
ALT: 24 U/L (ref 0–44)
AST: 25 U/L (ref 15–41)
Albumin: 3.4 g/dL — ABNORMAL LOW (ref 3.5–5.0)
Alkaline Phosphatase: 185 U/L — ABNORMAL HIGH (ref 38–126)
Anion gap: 7 (ref 5–15)
BUN: 31 mg/dL — ABNORMAL HIGH (ref 8–23)
CO2: 29 mmol/L (ref 22–32)
Calcium: 9 mg/dL (ref 8.9–10.3)
Chloride: 100 mmol/L (ref 98–111)
Creatinine: 0.9 mg/dL (ref 0.44–1.00)
GFR, Est AFR Am: >60 mL/min (ref >60)
GFR, Estimated: >60 mL/min (ref >60)
Glucose, Bld: 94 mg/dL (ref 70–99)
Potassium: 3.8 mmol/L (ref 3.5–5.1)
Sodium: 136 mmol/L (ref 135–145)
Total Bilirubin: 1 mg/dL (ref 0.3–1.2)
Total Protein: 5.8 g/dL — ABNORMAL LOW (ref 6.5–8.1)

## 2018-11-04 LAB — CBC WITH DIFFERENTIAL (CANCER CENTER ONLY)
Abs Immature Granulocytes: 0.06 10*3/uL (ref 0.00–0.07)
Basophils Absolute: 0 10*3/uL (ref 0.0–0.1)
Basophils Relative: 0 %
Eosinophils Absolute: 0 10*3/uL (ref 0.0–0.5)
Eosinophils Relative: 0 %
HCT: 31.5 % — ABNORMAL LOW (ref 36.0–46.0)
Hemoglobin: 10 g/dL — ABNORMAL LOW (ref 12.0–15.0)
Immature Granulocytes: 2 %
Lymphocytes Relative: 5 %
Lymphs Abs: 0.2 10*3/uL — ABNORMAL LOW (ref 0.7–4.0)
MCH: 27.9 pg (ref 26.0–34.0)
MCHC: 31.7 g/dL (ref 30.0–36.0)
MCV: 87.7 fL (ref 80.0–100.0)
Monocytes Absolute: 0.3 10*3/uL (ref 0.1–1.0)
Monocytes Relative: 9 %
Neutro Abs: 3.1 10*3/uL (ref 1.7–7.7)
Neutrophils Relative %: 84 %
Platelet Count: 66 10*3/uL — ABNORMAL LOW (ref 150–400)
RBC: 3.59 MIL/uL — ABNORMAL LOW (ref 3.87–5.11)
RDW: 20.5 % — ABNORMAL HIGH (ref 11.5–15.5)
WBC Count: 3.7 10*3/uL — ABNORMAL LOW (ref 4.0–10.5)
nRBC: 0 % (ref 0.0–0.2)

## 2018-11-04 LAB — LACTATE DEHYDROGENASE: LDH: 362 U/L — ABNORMAL HIGH (ref 98–192)

## 2018-11-04 MED ORDER — SODIUM CHLORIDE 0.9 % IV SOLN
3.4000 mg/kg | Freq: Once | INTRAVENOUS | Status: DC
  Filled 2018-11-04: qty 10

## 2018-11-04 MED ORDER — SODIUM CHLORIDE 0.9 % IV SOLN
Freq: Once | INTRAVENOUS | Status: AC
  Administered 2018-11-04: 11:00:00 via INTRAVENOUS
  Filled 2018-11-04: qty 250

## 2018-11-04 MED ORDER — DIPHENHYDRAMINE HCL 25 MG PO CAPS
ORAL_CAPSULE | ORAL | Status: AC
  Filled 2018-11-04: qty 2

## 2018-11-04 MED ORDER — FULVESTRANT 250 MG/5ML IM SOLN
500.0000 mg | INTRAMUSCULAR | Status: DC
  Administered 2018-11-04: 500 mg via INTRAMUSCULAR

## 2018-11-04 MED ORDER — ACETAMINOPHEN 325 MG PO TABS
ORAL_TABLET | ORAL | Status: AC
  Filled 2018-11-04: qty 2

## 2018-11-04 MED ORDER — SODIUM CHLORIDE 0.9 % IV SOLN
3.4000 mg/kg | Freq: Once | INTRAVENOUS | Status: AC
  Administered 2018-11-04: 200 mg via INTRAVENOUS
  Filled 2018-11-04: qty 10

## 2018-11-04 MED ORDER — SODIUM CHLORIDE 0.9% FLUSH
10.0000 mL | INTRAVENOUS | Status: DC | PRN
  Administered 2018-11-04: 10 mL
  Filled 2018-11-04: qty 10

## 2018-11-04 MED ORDER — FULVESTRANT 250 MG/5ML IM SOLN
INTRAMUSCULAR | Status: AC
  Filled 2018-11-04: qty 5

## 2018-11-04 MED ORDER — DIPHENHYDRAMINE HCL 25 MG PO CAPS
50.0000 mg | ORAL_CAPSULE | Freq: Once | ORAL | Status: AC
  Administered 2018-11-04: 50 mg via ORAL

## 2018-11-04 MED ORDER — ACETAMINOPHEN 325 MG PO TABS
650.0000 mg | ORAL_TABLET | Freq: Once | ORAL | Status: AC
  Administered 2018-11-04: 650 mg via ORAL

## 2018-11-04 MED ORDER — HEPARIN SOD (PORK) LOCK FLUSH 100 UNIT/ML IV SOLN
500.0000 [IU] | Freq: Once | INTRAVENOUS | Status: AC | PRN
  Administered 2018-11-04: 500 [IU]
  Filled 2018-11-04: qty 5

## 2018-11-04 NOTE — Patient Instructions (Addendum)
Fulvestrant injection What is this medicine? FULVESTRANT (ful VES trant) blocks the effects of estrogen. It is used to treat breast cancer. This medicine may be used for other purposes; ask your health care provider or pharmacist if you have questions. COMMON BRAND NAME(S): FASLODEX What should I tell my health care provider before I take this medicine? They need to know if you have any of these conditions: -bleeding disorders -liver disease -low blood counts, like low white cell, platelet, or red cell counts -an unusual or allergic reaction to fulvestrant, other medicines, foods, dyes, or preservatives -pregnant or trying to get pregnant -breast-feeding How should I use this medicine? This medicine is for injection into a muscle. It is usually given by a health care professional in a hospital or clinic setting. Talk to your pediatrician regarding the use of this medicine in children. Special care may be needed. Overdosage: If you think you have taken too much of this medicine contact a poison control center or emergency room at once. NOTE: This medicine is only for you. Do not share this medicine with others. What if I miss a dose? It is important not to miss your dose. Call your doctor or health care professional if you are unable to keep an appointment. What may interact with this medicine? -medicines that treat or prevent blood clots like warfarin, enoxaparin, dalteparin, apixaban, dabigatran, and rivaroxaban This list may not describe all possible interactions. Give your health care provider a list of all the medicines, herbs, non-prescription drugs, or dietary supplements you use. Also tell them if you smoke, drink alcohol, or use illegal drugs. Some items may interact with your medicine. What should I watch for while using this medicine? Your condition will be monitored carefully while you are receiving this medicine. You will need important blood work done while you are taking this  medicine. Do not become pregnant while taking this medicine or for at least 1 year after stopping it. Women of child-bearing potential will need to have a negative pregnancy test before starting this medicine. Women should inform their doctor if they wish to become pregnant or think they might be pregnant. There is a potential for serious side effects to an unborn child. Men should inform their doctors if they wish to father a child. This medicine may lower sperm counts. Talk to your health care professional or pharmacist for more information. Do not breast-feed an infant while taking this medicine or for 1 year after the last dose. What side effects may I notice from receiving this medicine? Side effects that you should report to your doctor or health care professional as soon as possible: -allergic reactions like skin rash, itching or hives, swelling of the face, lips, or tongue -feeling faint or lightheaded, falls -pain, tingling, numbness, or weakness in the legs -signs and symptoms of infection like fever or chills; cough; flu-like symptoms; sore throat -vaginal bleeding Side effects that usually do not require medical attention (report to your doctor or health care professional if they continue or are bothersome): -aches, pains -constipation -diarrhea -headache -hot flashes -nausea, vomiting -pain at site where injected -stomach pain This list may not describe all possible side effects. Call your doctor for medical advice about side effects. You may report side effects to FDA at 1-800-FDA-1088. Where should I keep my medicine? This drug is given in a hospital or clinic and will not be stored at home. NOTE: This sheet is a summary. It may not cover all possible information. If you   have questions about this medicine, talk to your doctor, pharmacist, or health care provider.  2019 Elsevier/Gold Standard (2017-09-17 11:34:41)   Ado-Trastuzumab Emtansine for injection What is this  medicine? ADO-TRASTUZUMAB EMTANSINE (ADD oh traz TOO zuh mab em TAN zine) is a monoclonal antibody combined with chemotherapy. It is used to treat breast cancer. This medicine may be used for other purposes; ask your health care provider or pharmacist if you have questions. COMMON BRAND NAME(S): Kadcyla What should I tell my health care provider before I take this medicine? They need to know if you have any of these conditions: -heart disease -heart failure -infection (especially a virus infection such as chickenpox, cold sores, or herpes) -liver disease -lung or breathing disease, like asthma -an unusual or allergic reaction to ado-trastuzumab emtansine, other medications, foods, dyes, or preservatives -pregnant or trying to get pregnant -breast-feeding How should I use this medicine? This medicine is for infusion into a vein. It is given by a health care professional in a hospital or clinic setting. Talk to your pediatrician regarding the use of this medicine in children. Special care may be needed. Overdosage: If you think you have taken too much of this medicine contact a poison control center or emergency room at once. NOTE: This medicine is only for you. Do not share this medicine with others. What if I miss a dose? It is important not to miss your dose. Call your doctor or health care professional if you are unable to keep an appointment. What may interact with this medicine? This medicine may also interact with the following medications: -atazanavir -boceprevir -clarithromycin -delavirdine -indinavir -dalfopristin; quinupristin -isoniazid, INH -itraconazole -ketoconazole -nefazodone -nelfinavir -ritonavir -telaprevir -telithromycin -tipranavir -voriconazole This list may not describe all possible interactions. Give your health care provider a list of all the medicines, herbs, non-prescription drugs, or dietary supplements you use. Also tell them if you smoke, drink  alcohol, or use illegal drugs. Some items may interact with your medicine. What should I watch for while using this medicine? Visit your doctor for checks on your progress. This drug may make you feel generally unwell. This is not uncommon, as chemotherapy can affect healthy cells as well as cancer cells. Report any side effects. Continue your course of treatment even though you feel ill unless your doctor tells you to stop. You may need blood work done while you are taking this medicine. Call your doctor or health care professional for advice if you get a fever, chills or sore throat, or other symptoms of a cold or flu. Do not treat yourself. This drug decreases your body's ability to fight infections. Try to avoid being around people who are sick. Be careful brushing and flossing your teeth or using a toothpick because you may get an infection or bleed more easily. If you have any dental work done, tell your dentist you are receiving this medicine. Avoid taking products that contain aspirin, acetaminophen, ibuprofen, naproxen, or ketoprofen unless instructed by your doctor. These medicines may hide a fever. Do not become pregnant while taking this medicine or for 7 months after stopping it, men with female partners should use contraception during treatment and for 4 months after the last dose. Women should inform their doctor if they wish to become pregnant or think they might be pregnant. There is a potential for serious side effects to an unborn child. Do not breast-feed an infant while taking this medicine or for 7 months after the last dose. Men who have a partner  who is pregnant or who is capable of becoming pregnant should use a condom during sexual activity while taking this medicine and for 4 months after stopping it. Men should inform their doctors if they wish to father a child. This medicine may lower sperm counts. Talk to your health care professional or pharmacist for more information. What  side effects may I notice from receiving this medicine? Side effects that you should report to your doctor or health care professional as soon as possible: -allergic reactions like skin rash, itching or hives, swelling of the face, lips, or tongue -breathing problems -chest pain or palpitations -fever or chills, sore throat -general ill feeling or flu-like symptoms -light-colored stools -nausea, vomiting -pain, tingling, numbness in the hands or feet -signs and symptoms of bleeding such as bloody or black, tarry stools; red or dark-brown urine; spitting up blood or brown material that looks like coffee grounds; red spots on the skin; unusual bruising or bleeding from the eye, gums, or nose -swelling of the legs or ankles -yellowing of the eyes or skin Side effects that usually do not require medical attention (report to your doctor or health care professional if they continue or are bothersome): -changes in taste -constipation -dizziness -headache -joint pain -muscle pain -trouble sleeping -unusually weak or tired This list may not describe all possible side effects. Call your doctor for medical advice about side effects. You may report side effects to FDA at 1-800-FDA-1088. Where should I keep my medicine? This drug is given in a hospital or clinic and will not be stored at home. NOTE: This sheet is a summary. It may not cover all possible information. If you have questions about this medicine, talk to your doctor, pharmacist, or health care provider.  2019 Elsevier/Gold Standard (2015-07-30 12:11:06)

## 2018-11-04 NOTE — Progress Notes (Signed)
Hematology and Oncology Follow Up Visit  Michelle Bowen [id number] 08-May-1941 78 y.o. 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  I am absolutely amazed to see how well she looks.  She was hospitalized a week ago.  She really was not very tough shape.  She was not eating.  It was like she was "shutting down."  She was discharged from the hospital 5 days ago.  She has been home.  She was getting hospice but now does not need hospice.  Her niece came down from [place].  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  She is eating again.  She feels good.  She is not hurting.  She is on a Duragesic patch and really not taking anything else.  She is having no problems going to the bathroom.  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  This might be a little more prominent.  Probably is related to her CNS disease.  She has had no cough or shortness of breath.  Is very pleased that she has recovered so well.  I have samples of Verzenio.  I gave her samples to her knees.  I showed them how to open the sample packets.  I went over the side effects that we would have to watch out for.  I think the side effect is diarrhea.  I do not think that we will see an effect on her blood counts.  He does have some thrombocytopenia but I do not think the Verzenio should affect this.  She will take the Verzenio twice a day.  I told Michelle Bowen and her niece that the Verzenio works with the Faslodex to try to help with the breast  cancer.  She has had no bleeding.  There is been no bruising.  Overall, she seems to be eating a whole lot better.  She says she is eating organic foods.  She is complaining of pain in the right thigh.  I set up a x-ray for her before she leaves so we can see if there is any issues there that will need to be addressed.  Overall, I would have to say that her performance status is probably ECOG 1-2.    Medications:  Allergies as of 11/04/2018   No Known Allergies     Medication List       Accurate as of Nov 04, 2018  4:49 PM. If you have any questions, ask your nurse or doctor.        STOP taking these medications   ciprofloxacin 500 MG/5ML (10%) suspension Commonly known as:  CIPRO Stopped by:  Volanda Napoleon, MD   famotidine 40 MG tablet Commonly known as:  PEPCID Stopped by:  Volanda Napoleon, MD   fluconazole 40 MG/ML suspension Commonly known as:  DIFLUCAN Stopped by:  Volanda Napoleon, MD   gabapentin 300 MG capsule Commonly known as:  NEURONTIN Stopped by:  Volanda Napoleon,  MD   haloperidol 2 MG/ML solution Commonly known as:  HALDOL Stopped by:  Volanda Napoleon, MD   LORazepam 2 MG/ML concentrated solution Commonly known as:  LORazepam Intensol Stopped by:  Volanda Napoleon, MD   memantine tablet pack Commonly known as:  NAMENDA TITRATION PACK   morphine 20 MG/5ML solution Stopped by:  Volanda Napoleon, MD   potassium chloride 20 MEQ packet Commonly known as:  KLOR-CON     TAKE these medications   dexamethasone 4 MG tablet Commonly known as:  DECADRON Take 4 mg by mouth daily. What changed:  Another medication with the same name was removed. Continue taking this medication, and follow the directions you see here. Changed by:  Volanda Napoleon, MD   fentaNYL 25 MCG/HR Commonly known as:  Pocola 1 patch onto the skin every 3 (three) days. What changed:  Another medication with the same name was removed. Continue taking this medication, and  follow the directions you see here. Changed by:  Volanda Napoleon, MD   irbesartan 75 MG tablet Commonly known as:  AVAPRO Take 1 tablet (75 mg total) by mouth daily.   polyethylene glycol 17 g packet Commonly known as:  MIRALAX / GLYCOLAX Take 17 g by mouth daily.   PROBIOTIC ACIDOPHILUS PO Take by mouth.   vitamin B-12 500 MCG tablet Commonly known as:  CYANOCOBALAMIN Take 500 mcg by mouth daily.   Vitamin D3 125 MCG (5000 UT) Tabs Take by mouth.       Allergies: No Known Allergies  Past Medical History, Surgical history, Social history, and Family History were reviewed and updated.  Review of Systems: Review of Systems  Constitutional: Positive for malaise/fatigue and weight loss.  HENT: Negative.   Eyes: Positive for photophobia and redness.  Respiratory: Negative.   Cardiovascular: Negative.   Gastrointestinal: Positive for abdominal pain.  Genitourinary: Negative.   Musculoskeletal: Positive for back pain.  Skin: Negative.   Neurological: Positive for weakness.  Endo/Heme/Allergies: Negative.   Psychiatric/Behavioral: Negative.       Physical Exam:  oral temperature is 98.1 F (36.7 C). Her blood pressure is 166/88 (abnormal) and her pulse is 72. Her oxygen saturation is 100%.   Wt Readings from Last 3 Encounters:  10/27/18 133 lb 6.1 oz (60.5 kg)  10/15/18 139 lb (63 kg)  10/09/18 147 lb 7.8 oz (66.9 kg)    Physical Exam Vitals signs reviewed.  Constitutional:      Comments: Much more alert appearing African-American female.  She is alert and oriented.  She is not having any obvious pain.  HENT:     Head: Normocephalic and atraumatic.  Eyes:     Pupils: Pupils are equal, round, and reactive to light.     Comments: Ocular exam shows ptosis of the right eye.  She has tearing of the right eye.  Left eye has good extraocular muscle movement.  Neck:     Musculoskeletal: Normal range of motion.  Cardiovascular:     Rate and Rhythm: Normal rate and  regular rhythm.     Heart sounds: Normal heart sounds.  Pulmonary:     Effort: Pulmonary effort is normal.     Breath sounds: Normal breath sounds.  Abdominal:     General: Bowel sounds are normal.     Palpations: Abdomen is soft.     Comments: Abdominal exam shows a soft abdomen.  There is no fluid wave.  There is no palpable liver or spleen tip.  She has no guarding or rebound tenderness.  Musculoskeletal: Normal range of motion.        General: No tenderness or deformity.     Comments: Back exam shows some tenderness to palpation in the thoracic and lumbar spine.  I cannot detect any obvious paravertebral muscle spasm.  She does some tenderness to palpation in the left rib cage.  Lymphadenopathy:     Cervical: No cervical adenopathy.  Skin:    General: Skin is warm and dry.     Findings: No erythema or rash.  Neurological:     Mental Status: She is alert and oriented to person, place, and time.  Psychiatric:        Behavior: Behavior normal.        Thought Content: Thought content normal.        Judgment: Judgment normal.      Lab Results  Component Value Date   WBC 3.7 (L) 11/04/2018   HGB 10.0 (L) 11/04/2018   HCT 31.5 (L) 11/04/2018   MCV 87.7 11/04/2018   PLT 66 (L) 11/04/2018   Lab Results  Component Value Date   FERRITIN 1,455 (H) 10/15/2018   IRON 128 10/15/2018   TIBC 249 10/15/2018   UIBC 121 10/15/2018   IRONPCTSAT 51 10/15/2018   Lab Results  Component Value Date   RBC 3.59 (L) 11/04/2018   No results found for: KPAFRELGTCHN, LAMBDASER, KAPLAMBRATIO No results found for: IGGSERUM, IGA, IGMSERUM No results found for: Odetta Pink, SPEI   Chemistry      Component Value Date/Time   NA 136 11/04/2018 0940   NA 147 (H) 06/11/2017 1345   NA 139 04/23/2017 1256   K 3.8 11/04/2018 0940   K 4.3 06/11/2017 1345   K 4.2 04/23/2017 1256   CL 100 11/04/2018 0940   CL 106 06/11/2017 1345   CO2 29  11/04/2018 0940   CO2 27 06/11/2017 1345   CO2 28 04/23/2017 1256   BUN 31 (H) 11/04/2018 0940   BUN 13 06/11/2017 1345   BUN 10.0 04/23/2017 1256   CREATININE 0.90 11/04/2018 0940   CREATININE 1.1 06/11/2017 1345   CREATININE 0.8 04/23/2017 1256      Component Value Date/Time   CALCIUM 9.0 11/04/2018 0940   CALCIUM 8.9 06/11/2017 1345   CALCIUM 8.7 04/23/2017 1256   ALKPHOS 185 (H) 11/04/2018 0940   ALKPHOS 85 (H) 06/11/2017 1345   ALKPHOS 104 04/23/2017 1256   AST 25 11/04/2018 0940   AST 15 04/23/2017 1256   ALT 24 11/04/2018 0940   ALT 17 06/11/2017 1345   ALT <6 04/23/2017 1256   BILITOT 1.0 11/04/2018 0940   BILITOT 0.59 04/23/2017 1256       Impression and Plan: Michelle Bowen is a very pleasant 78 yo African American female with metastatic breast cancer, triple positive.   Hopefully, we will be able to get a handle on her disease and try to get her disease regressed.  I know this would be very beneficial for quality of life.  Hopefully, she will do okay with the Verzenio.  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  Maybe, we will see response with the Faslodex and the Kadcyla.  I will plan to get her back to see [place] in another 2 weeks.  I think we still have to follow her relatively closely for right now.         Volanda Napoleon, MD 5/14/20204:49 PM

## 2018-11-04 NOTE — Progress Notes (Signed)
Reviewed all labwork with Dr Ennever.  Ok to treat today.   

## 2018-11-04 NOTE — Patient Instructions (Signed)
Implanted Port Insertion, Care After  This sheet gives you information about how to care for yourself after your procedure. Your health care provider may also give you more specific instructions. If you have problems or questions, contact your health care provider.  What can I expect after the procedure?  After the procedure, it is common to have:  · Discomfort at the port insertion site.  · Bruising on the skin over the port. This should improve over 3-4 days.  Follow these instructions at home:  Port care  · After your port is placed, you will get a manufacturer's information card. The card has information about your port. Keep this card with you at all times.  · Take care of the port as told by your health care provider. Ask your health care provider if you or a family member can get training for taking care of the port at home. A home health care nurse may also take care of the port.  · Make sure to remember what type of port you have.  Incision care         · Follow instructions from your health care provider about how to take care of your port insertion site. Make sure you:  ? Wash your hands with soap and water before and after you change your bandage (dressing). If soap and water are not available, use hand sanitizer.  ? Change your dressing as told by your health care provider.  ? Leave stitches (sutures), skin glue, or adhesive strips in place. These skin closures may need to stay in place for 2 weeks or longer. If adhesive strip edges start to loosen and curl up, you may trim the loose edges. Do not remove adhesive strips completely unless your health care provider tells you to do that.  · Check your port insertion site every day for signs of infection. Check for:  ? Redness, swelling, or pain.  ? Fluid or blood.  ? Warmth.  ? Pus or a bad smell.  Activity  · Return to your normal activities as told by your health care provider. Ask your health care provider what activities are safe for you.  · Do not  lift anything that is heavier than 10 lb (4.5 kg), or the limit that you are told, until your health care provider says that it is safe.  General instructions  · Take over-the-counter and prescription medicines only as told by your health care provider.  · Do not take baths, swim, or use a hot tub until your health care provider approves. Ask your health care provider if you may take showers. You may only be allowed to take sponge baths.  · Do not drive for 24 hours if you were given a sedative during your procedure.  · Wear a medical alert bracelet in case of an emergency. This will tell any health care providers that you have a port.  · Keep all follow-up visits as told by your health care provider. This is important.  Contact a health care provider if:  · You cannot flush your port with saline as directed, or you cannot draw blood from the port.  · You have a fever or chills.  · You have redness, swelling, or pain around your port insertion site.  · You have fluid or blood coming from your port insertion site.  · Your port insertion site feels warm to the touch.  · You have pus or a bad smell coming from the port   insertion site.  Get help right away if:  · You have chest pain or shortness of breath.  · You have bleeding from your port that you cannot control.  Summary  · Take care of the port as told by your health care provider. Keep the manufacturer's information card with you at all times.  · Change your dressing as told by your health care provider.  · Contact a health care provider if you have a fever or chills or if you have redness, swelling, or pain around your port insertion site.  · Keep all follow-up visits as told by your health care provider.  This information is not intended to replace advice given to you by your health care provider. Make sure you discuss any questions you have with your health care provider.  Document Released: 03/30/2013 Document Revised: 01/05/2018 Document Reviewed:  01/05/2018  Elsevier Interactive Patient Education © 2019 [employer]

## 2018-11-04 NOTE — Telephone Encounter (Signed)
lvm to infor pt of 6/11 appt at 1045 am per 5/14 LOS

## 2018-11-05 ENCOUNTER — Telehealth: Payer: Self-pay | Admitting: Hematology & Oncology

## 2018-11-05 ENCOUNTER — Other Ambulatory Visit: Payer: Medicare Other

## 2018-11-05 ENCOUNTER — Ambulatory Visit: Payer: Medicare Other

## 2018-11-05 ENCOUNTER — Ambulatory Visit: Payer: Medicare Other | Admitting: Family

## 2018-11-05 ENCOUNTER — Telehealth: Payer: Self-pay | Admitting: *Deleted

## 2018-11-05 ENCOUNTER — Other Ambulatory Visit: Payer: Self-pay | Admitting: *Deleted

## 2018-11-05 DIAGNOSIS — C7951 Secondary malignant neoplasm of bone: Secondary | ICD-10-CM

## 2018-11-05 DIAGNOSIS — C50411 Malignant neoplasm of upper-outer quadrant of right female breast: Secondary | ICD-10-CM

## 2018-11-05 DIAGNOSIS — Z170 Estrogen receptor positive status [ER+]: Secondary | ICD-10-CM

## 2018-11-05 LAB — IRON AND TIBC
Iron: 132 ug/dL (ref 41–142)
Saturation Ratios: 60 % — ABNORMAL HIGH (ref 21–57)
TIBC: 219 ug/dL — ABNORMAL LOW (ref 236–444)
UIBC: 87 ug/dL — ABNORMAL LOW (ref 120–384)

## 2018-11-05 LAB — FERRITIN: Ferritin: 3397 ng/mL — ABNORMAL HIGH (ref 11–307)

## 2018-11-05 MED ORDER — OXYCODONE HCL 5 MG PO TABS: ORAL_TABLET | ORAL | 0 refills | Status: AC

## 2018-11-05 NOTE — Telephone Encounter (Signed)
Call received from patient's niece, [name] requesting a refill of Oxycodone 5 mg for patient.  She states that patient woke up with pain behind her ears and that she has taken two Oxycodone 5 mg tablets this AM and has only five tablets left.  Dr. Marin Olp notified and prescription sent.  Notified [name] of patients next appointment and also that pt.'s schedule has been mailed to her. [name] appreciative of assistance and has no further questions at this time.

## 2018-11-05 NOTE — Telephone Encounter (Signed)
sw pt niece [name] to confirm 5/28 appts at 1030 am per 5/14 sch msg

## 2018-11-08 ENCOUNTER — Other Ambulatory Visit: Payer: Self-pay

## 2018-11-08 ENCOUNTER — Ambulatory Visit
Admission: RE | Admit: 2018-11-08 | Discharge: 2018-11-08 | Disposition: A | Discharge status: Home / Self Care | Payer: Medicare Other | Source: Ambulatory Visit | Attending: Radiation Oncology | Admitting: Radiation Oncology

## 2018-11-08 ENCOUNTER — Other Ambulatory Visit: Payer: Self-pay | Admitting: *Deleted

## 2018-11-08 ENCOUNTER — Telehealth: Payer: Self-pay | Admitting: Radiation Oncology

## 2018-11-08 DIAGNOSIS — C50411 Malignant neoplasm of upper-outer quadrant of right female breast: Secondary | ICD-10-CM

## 2018-11-08 DIAGNOSIS — C7951 Secondary malignant neoplasm of bone: Secondary | ICD-10-CM

## 2018-11-08 DIAGNOSIS — C7949 Secondary malignant neoplasm of other parts of nervous system: Secondary | ICD-10-CM

## 2018-11-08 DIAGNOSIS — C7931 Secondary malignant neoplasm of brain: Secondary | ICD-10-CM

## 2018-11-08 MED ORDER — FENTANYL 25 MCG/HR TD PT72: 1.0000 | MEDICATED_PATCH | TRANSDERMAL | 0 refills | Status: AC

## 2018-11-08 NOTE — Progress Notes (Signed)
Radiation Oncology         [phone]   Outpatient Follow Up - Conducted via telephone due to current COVID-19 concerns for limiting patient exposure  I spoke with the patient's neice to conduct this consult visit via telephone to spare the patient unnecessary potential exposure in the healthcare setting during the current COVID-19 pandemic. The patient was notified in advance and was offered a [place] meeting to allow for face to face communication but unfortunately reported that they did not have the appropriate resources/technology to support such a visit with all in attendance, and instead preferred to proceed with a telephone conversation.  ________________________________  Name: Michelle Bowen        MRN: [id number]  Date of Service: 11/08/2018 DOB: [birth year]  UT:[id number], No Pcp Per  No ref. provider found     REFERRING PHYSICIAN: Dr. Marin Olp  DIAGNOSIS: Metastatic Breast Cancer  HISTORY OF PRESENT ILLNESS: Michelle Bowen is a 78 y.o. 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 She was treated surgically for her breast disease in August 2018 and had Stage III, pT2N3aM0 disease. She was counseled on the role of adjuvant chemotherapy though declined. She did however accept adjuvant radiotherapy to the breast and regional nodes with Dr. Lisbeth Renshaw. She was lost to follow up until she was diagnosed with recurrence in the RLL and T12-L1 spine in March 2018 at [facility]. When she returned to [facility], MRI of the thoracic spine on 08/26/16 revealed concerns for metastatic disease with the largest lesions at T6, T8, T12, and L1 with 40% pathologic compression deformity, and foraminal narrowing from T12-L1 and L1-2.  No cord abnormalitiy or edema was noted. She was started on steroids and met with Dr. Lisbeth Renshaw and I and our plan was to proceed with radiotherapy. She decided to relocate to live closer to her son in [place] and  receive care at [facility] ([facility]). She apparently had a vertebral augmentation in April 2018, followed by palliative radiotherapy to the L spine and Pelvis. She underwent lumbar laminectomy and stabilization surgery at [facility] in [place]. She proceeded with antiestrogen, Taxotere/Herceptin/Perjecta and completed 6 cycles of this. She relocated in October 2018 back to [place]. She decided to relocate her oncology care from Dr. [name] to Dr. Marin Olp due to the clinic location. She has been under Dr. Antonieta Pert care resumed Letrozole and Herceptin in December 2018. That infusion was held due to tolerability and other options of systemic therapy were considered. She did not return in January 2020 for follow up, but called complaining of facial drooping. She was seen in the ED at [facility], but left AMA before she could have scans performed. She returned to Dr. Antonieta Pert attention on 10/04/2018 due to progressive chest wall, back, and facial pain; and with her evaluation, she was encouraged to proceed with admission. Upon admission, she had imaging of the brain and spine Monday and yesterday of this week. Unfortunately this indicates significant disease in the brain, a 3.1 x 1.8 cm mass in the left middle cranial fossa with a broad interface involving the dura and appears extraaxial. A mixed cystic and solid appearing mass in the left temporal lobe was also noted measuring 3.7 x 2.5 cm with moderately extensive vasogenic edema. There was mild mass effect on the left cerebral peduncle and an enhancing mass involving the temporal bone with dural involvement along the anterior, posterior, and superior  aspects of the petrous bone. It appears to extend into the right internal auditory canal. Her spine images reveal concerns for multiple rib metastases and a compression fracture at T4 with 50% heigh loss, sclerotic lesions at T6, and T8, as well as lucency that extends into both pedicles and  posterior elements. Because of these findings, the patient was discussed in conference. She was not a surgical candidate for craniotomy, and proceeded with palliative radiotherapy to the brain, t spine, and adjacent ribs. She began her 30 Gy prescribed treatment but did not complete the last 2 of the 10 anticipated fractions. She had some neurologic decline. It was felt that this represented overall decline and disease trajectory. She was enrolled in hospice, but made a significant recovery neurologically. She is ready to proceed with active treatment. I contacted her by phone along with her niece [name] who's been caring for her temporarily, and her son Nathaneil Canary who helps her make medical decisions. The patient was seen last Friday with her medical oncologist Dr. Marin Olp, and she complained of some right hip pain. She had an x-ray that day that revealed a pathologic fracture of the right superior pubic rami. She also had a metastatic lesion in the proximal right femur at the diaphysis. She is contacted to discuss the option of palliative radiotherapy to the right hip.     PREVIOUS RADIATION THERAPY: Yes  10/13/2018-10/22/2018:  The patient received 24 Gy of the planned 30 Gy in 8 fractions. She did not complete the last two fractions but the T spine, left ribs, and whole brain were treated to 24 Gy  09/25/16-10/06/16:  28 Gy to the lumbar spine and pelvis in 8 fxns with Dr. Doreen Beam at [place]  03/17/16-05/05/16: 1. Right breast/ 50.4 Gy in 28 fractions 2. Right breast boost/ 16 Gy in 8 fractions 3. Right axilla / 50.4 Gy in 28 fractions  PAST MEDICAL HISTORY:  Past Medical History:  Diagnosis Date   Arthritis    bil knees, left thumb   Breast cancer ([place]) 12/06/15   Right   Breast cancer metastasized to brain, unspecified laterality ([place]) 10/11/2018   Breast cancer metastasized to liver, unspecified laterality ([place]) 10/11/2018   Cancer, metastatic to bone ([place]) 08/2016    Counseling regarding goals of care 06/11/2017   Hypertension    Iron deficiency anemia due to chronic blood loss 10/07/2018       PAST SURGICAL HISTORY: Past Surgical History:  Procedure Laterality Date   BREAST LUMPECTOMY WITH AXILLARY LYMPH NODE DISSECTION Right 01/17/2016   Procedure: RIGHT BREAST LUMPECTOMY WITH AXILLARY LYMPH NODE DISSECTION;  Surgeon: [name], MD;  Location: [place];  Service: General;  Laterality: Right;   BREAST SURGERY Right 2001   sebaceous cyst removal   RADIOACTIVE SEED GUIDED EXCISIONAL BREAST BIOPSY Left 01/17/2016   Procedure: RADIOACTIVE SEED GUIDED EXCISIONAL LEFT BREAST BIOPSY;  Surgeon: [name], MD;  Location: [place];  Service: General;  Laterality: Left;   RE-EXCISION OF BREAST LUMPECTOMY Right 02/05/2016   Procedure: RE-EXCISION OF BREAST LUMPECTOMY AND REMOVAL OF NIPPLE;  Surgeon: [name], MD;  Location: [place];  Service: General;  Laterality: Right;   TONSILLECTOMY       FAMILY HISTORY:  Family History  Problem Relation Age of Onset   Breast cancer Sister 39       breast   Breast cancer Other        neice at 38   Breast cancer Other  neice at 50     SOCIAL HISTORY:  reports that she has quit smoking. She has never used smokeless tobacco. She reports current alcohol use. She reports that she does not use drugs. The patient is single. 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 Her son Magdalen Spatz helps her with medical decision making. Her niece Celene Kras is a Merchandiser, retail who lives in [place] and has also cared for Ms. Scarano.    ALLERGIES: Patient has no known allergies.   MEDICATIONS:  Current Outpatient Medications  Medication Sig Dispense Refill   Cholecalciferol (VITAMIN D3) 5000 units TABS Take by mouth.     cyanocobalamin 500 MCG tablet Take 500 mcg by mouth daily.     dexamethasone (DECADRON) 4 MG tablet Take 4 mg by mouth  daily.     fentaNYL (DURAGESIC) 25 MCG/HR Place 1 patch onto the skin every 3 (three) days. 10 patch 0   irbesartan (AVAPRO) 75 MG tablet Take 1 tablet (75 mg total) by mouth daily. 14 tablet 0   Lactobacillus (PROBIOTIC ACIDOPHILUS PO) Take by mouth.     oxyCODONE (OXY IR/ROXICODONE) 5 MG immediate release tablet Take 1-2, if needed, every 6 hours for pain 90 tablet 0   polyethylene glycol (MIRALAX / GLYCOLAX) 17 g packet Take 17 g by mouth daily. 14 each 0   No current facility-administered medications for this visit.      REVIEW OF SYSTEMS: On review of systems, the patient feels like she's doing much better. Pain persists in the right hip area that she describes as moderate. She is interested in relocating her care to [place] in [place].     PHYSICAL EXAM:  Wt Readings from Last 3 Encounters:  10/27/18 133 lb 6.1 oz (60.5 kg)  10/15/18 139 lb (63 kg)  10/09/18 147 lb 7.8 oz (66.9 kg)   Exam is unable to be performed due to nature of the encounter type.  ECOG = 1  0 - Asymptomatic (Fully active, able to carry on all predisease activities without restriction)  1 - Symptomatic but completely ambulatory (Restricted in physically strenuous activity but ambulatory and able to carry out work of a light or sedentary nature. For example, light housework, office work)  2 - Symptomatic, <50% in bed during the day (Ambulatory and capable of all self care but unable to carry out any work activities. Up and about more than 50% of waking hours)  3 - Symptomatic, >50% in bed, but not bedbound (Capable of only limited self-care, confined to bed or chair 50% or more of waking hours)  4 - Bedbound (Completely disabled. Cannot carry on any self-care. Totally confined to bed or chair)  5 - Death   Eustace Pen MM, Creech RH, Tormey DC, et al. [phone]). "Toxicity and response criteria of the [facility]". [place] Oncol. 5 (6): 649-55    LABORATORY DATA:  Lab Results   Component Value Date   WBC 3.7 (L) 11/04/2018   HGB 10.0 (L) 11/04/2018   HCT 31.5 (L) 11/04/2018   MCV 87.7 11/04/2018   PLT 66 (L) 11/04/2018   Lab Results  Component Value Date   NA 136 11/04/2018   K 3.8 11/04/2018   CL 100 11/04/2018   CO2 29 11/04/2018   Lab Results  Component Value Date   ALT 24 11/04/2018   AST 25 11/04/2018   ALKPHOS 185 (H) 11/04/2018   BILITOT 1.0 11/04/2018      RADIOGRAPHY: Ct Head  Wo Contrast  Result Date: 10/27/2018 CLINICAL DATA:  78 year old female with altered mental status for the past several days. History of brain cancer with metastatic disease. EXAM: CT HEAD WITHOUT CONTRAST TECHNIQUE: Contiguous axial images were obtained from the base of the skull through the vertex without intravenous contrast. COMPARISON:  Most recent prior brain MRI 10/05/2018 FINDINGS: Brain: Masslike hypointensity within the inferior left temporal lobe and inferior aspect of the left frontal lobe are again noted consistent with known brain metastases and associated cytogenic edema. There are some Stipe old calcifications within the dominant masses in the left temporal lobe. Comparing across modalities to the prior MRI, there may be slightly more edema extending into the white matter of the inferior left frontal lobe. No new hydro cephalo assess or midline shift. No evidence of acute intracranial hemorrhage or infarct. Additional foci of periventricular, deep and subcortical white matter hypoattenuation are present bilaterally consistent with chronic microvascular ischemic white matter disease. Vascular: No hyperdense vessel or unexpected calcification. Skull: Subtle infiltrative lesion within the petrous portion of the right temporal bone consistent with known metastatic disease. Sinuses/Orbits: No acute finding. Other: None. IMPRESSION: 1. Perhaps slight interval increase in edema extending into the white matter of the inferior left frontal lobe when comparing across modalities  to the prior MRI dated 10/05/2018. 2. Otherwise, similar appearance of metastatic disease within the left temporal lobe and the petrous portion of the right temporal bone. 3. No evidence of intracranial hemorrhage, hydrocephalus or midline shift. Electronically Signed   By: Jacqulynn Cadet M.D.   On: 10/27/2018 16:00   Dg Chest Port 1 View  Result Date: 10/27/2018 CLINICAL DATA:  History of metastatic breast carcinoma with decreased responsiveness, initial encounter EXAM: PORTABLE CHEST 1 VIEW COMPARISON:  10/04/2018 FINDINGS: Cardiac shadow is stable. Left-sided chest wall port is again seen and stable. Postsurgical changes in the right breast and thoracolumbar spine are again seen and stable. Metastatic disease in the posterior left rib cage is noted similar to that seen on prior exams. No new focal infiltrate or effusion is seen. IMPRESSION: Changes consistent with the patient's known history. No acute abnormality noted. Electronically Signed   By: Inez Catalina M.D.   On: 10/27/2018 13:41   Dg Femur, Min 2 Views Right  Result Date: 11/05/2018 CLINICAL DATA:  78 year old female with leg pain EXAM: RIGHT FEMUR 2 VIEWS COMPARISON:  Prior CT 10/08/2018 FINDINGS: Destructive bone changes of the right hemipelvis involving the superior ramus, with disruption of the pectineal line and widening of the cortex at the superior margin of obturator foramen. Irregular cortex with destructive changes at the posterior/medial margin of the proximal femur just below the femoral neck. IMPRESSION: Demonstration of metastatic disease with pathologic fracture of the right superior pubic ramus, and destructive bone lesion of the proximal right femoral diaphysis, at the posterior/medial margin. Electronically Signed   By: Corrie Mckusick D.O.   On: 11/05/2018 08:11       IMPRESSION/PLAN: 1. Recurrent Metastatic Stage III, pT2N3aM0 ER positive, HER-2 amplified invasive ductal carcinoma of the right breast.  I spent time with  the patient, her niece, and her son by telephone call. We discussed the events that have occurred since our last conversation. Her son Nathaneil Canary is interested in having his mother's care transferred back to [place] in [place], and the patient is in agreement. He's been trying to coordinate housing arrangements, as he is in the middle if remodeling his own home. He plans to take Ms. Bloomquist back to  the [facility] area tomorrow, and will be contacting Dr Gerome Apley in radiation oncology. We have already asked for her recent films to be sent to Dr. Gerome Apley electronically as well as faxed. I did discuss that we would be happy to treat her in [place] with palliative radiotherapy to the right hip and that Dr. Lisbeth Renshaw would offer this treatment over 5 fractions. We anticipate that she will be offered a similar treatment plan at [facility]. I stressed the importance of continued follow up regarding her brain disease, and let them know that we would recommend this 3 months from her last course of radiotherapy to the brain. They are in agreement and will keep [place] aware of questions or concerns moving forward. We wish her all the [place] moving forward with her care and would be happy to weigh in on her case in the future as needed or requested.    Given current concerns for patient exposure during the COVID-19 pandemic, this encounter was conducted via telephone.  The patient has given verbal consent for this type of encounter. The time spent during this encounter was 20 minutes and 50% of that time was spent in the coordination of the patient's care. The attendants for this meeting include Shona Simpson, [facility] and [employer], her niece [place], and son Magdalen Spatz. During the encounter, Shona Simpson [facility] was working remotely from home.  Zahria L Chavira  and [name] were located at the patient's home. Mr. Derrel Nip was driving on his way to the patient.      Carola Rhine, PAC

## 2018-11-08 NOTE — Telephone Encounter (Signed)
I called the patient's niece, [name] who's taking care of the patient right now. I confirmed the significant change in the patient's status and she's now able to eat and drink, walking around, but not likely able to care for herself. [name] states she needs to go home to [place], the patient's son is going to try to come and perhaps take Ms. Sansoucie back to [facility] for the rest of her treatments. We will have a conference call this afternoon to confirm plans.

## 2018-11-09 ENCOUNTER — Encounter: Payer: Self-pay | Admitting: Radiation Oncology

## 2018-11-09 NOTE — Progress Notes (Signed)
I called Whitney, RN with Dr. Gerome Apley at [facility] to update her on the patient's plans to return to their clinic for palliative XRT to the right hip.

## 2018-11-16 NOTE — Progress Notes (Signed)
  Radiation Oncology         [phone] ________________________________  Name: Michelle Bowen MRN: [id number]  Date: 10/27/2018  DOB: 12-Aug-1940  End of Treatment Note  Diagnosis:   77 y.o. female with Stage IV breast cancer with bone and brain metastases  Indication for treatment:  Palliative       Radiation treatment dates:   10/13/2018 - 10/22/2018  Site/planned dose/actual dose:    1. Whole Brain / 30 Gy in 10 fractions / 24 Gy in 8 fractions 2. Left Upper Ribs / 30 Gy in 10 fractions / 24 Gy in 8 fractions 3. T8 Spine / 30 Gy in 10 fractions / 24 Gy in 8 fractions  Beams/energy:    1. Isodose Plan / 6X Photon 2. Isodose Plan / 6X, 15X Photon 3. Isodose Plan / 15X Photon  Narrative: The patient tolerated radiation treatment relatively well.   She reported overall improvement in pain. However, she elected to discontinue her treatment after 8 fractions, AMA. During her treatment, she experienced increased fatigue, continued decreased vision in her right eye, and continued difficulty with grasping objects. She also noted headaches and dizziness.   Plan: The patient has completed radiation treatment. The patient will return to radiation oncology clinic as needed. I advised them to call or return if they have any questions or concerns related to their recovery or treatment.  ------------------------------------------------  Jodelle Gross, MD, PhD  This document serves as a record of services personally performed by Kyung Rudd, MD. It was created on his behalf by Rae Lips, a trained medical scribe. The creation of this record is based on the scribe's personal observations and the provider's statements to them. This document has been checked and approved by the attending provider.

## 2018-11-18 ENCOUNTER — Other Ambulatory Visit: Payer: Medicare Other

## 2018-11-18 ENCOUNTER — Ambulatory Visit: Payer: Medicare Other | Admitting: Hematology & Oncology

## 2018-11-26 ENCOUNTER — Ambulatory Visit: Payer: Medicare Other | Admitting: Hematology & Oncology

## 2018-11-26 ENCOUNTER — Other Ambulatory Visit: Payer: Medicare Other

## 2018-11-26 ENCOUNTER — Ambulatory Visit: Payer: Medicare Other

## 2018-11-29 ENCOUNTER — Ambulatory Visit: Payer: Self-pay | Admitting: Radiation Oncology

## 2018-11-30 ENCOUNTER — Telehealth: Payer: Self-pay | Admitting: Hematology & Oncology

## 2018-11-30 NOTE — Telephone Encounter (Signed)
Faxed medical records STAT to: [facility] P: [phone] F: [phone]   for   Michelle Bowen 11-22-1940 10/2018-PRESENT      COPY SCANNED

## 2018-12-02 ENCOUNTER — Other Ambulatory Visit: Payer: Medicare Other

## 2018-12-02 ENCOUNTER — Ambulatory Visit: Payer: Medicare Other | Admitting: Hematology & Oncology

## 2018-12-02 ENCOUNTER — Ambulatory Visit: Payer: Medicare Other

## 2018-12-17 ENCOUNTER — Other Ambulatory Visit: Payer: Medicare Other

## 2018-12-17 ENCOUNTER — Ambulatory Visit: Payer: Medicare Other

## 2018-12-17 ENCOUNTER — Ambulatory Visit: Payer: Medicare Other | Admitting: Hematology & Oncology

## 2018-12-30 ENCOUNTER — Ambulatory Visit: Payer: Medicare Other

## 2018-12-30 ENCOUNTER — Ambulatory Visit: Payer: Medicare Other | Admitting: Hematology & Oncology

## 2018-12-30 ENCOUNTER — Other Ambulatory Visit: Payer: Medicare Other

## 2019-01-20 ENCOUNTER — Telehealth: Payer: Self-pay | Admitting: Radiation Therapy

## 2019-01-20 NOTE — Telephone Encounter (Signed)
Mr. Derrel Nip returned my call about a follow-up for his mother. He informed me that she passed away a couple of weeks ago. I shared our condolences and have cancelled her future appointments.  Mont Dutton R.T.(R)(T) Special Procedures Navigator

## 2019-01-27 ENCOUNTER — Other Ambulatory Visit: Payer: Medicare Other

## 2019-01-31 ENCOUNTER — Ambulatory Visit: Payer: Self-pay | Admitting: Radiation Oncology

## 2019-10-14 IMAGING — DX CHEST - 2 VIEW
2 series · 2 of 2 positions shown · non-contrast
Comparison: None.

CLINICAL DATA: Chest pain, metastatic breast cancer

EXAM:
CHEST - 2 VIEW

[chest pa]
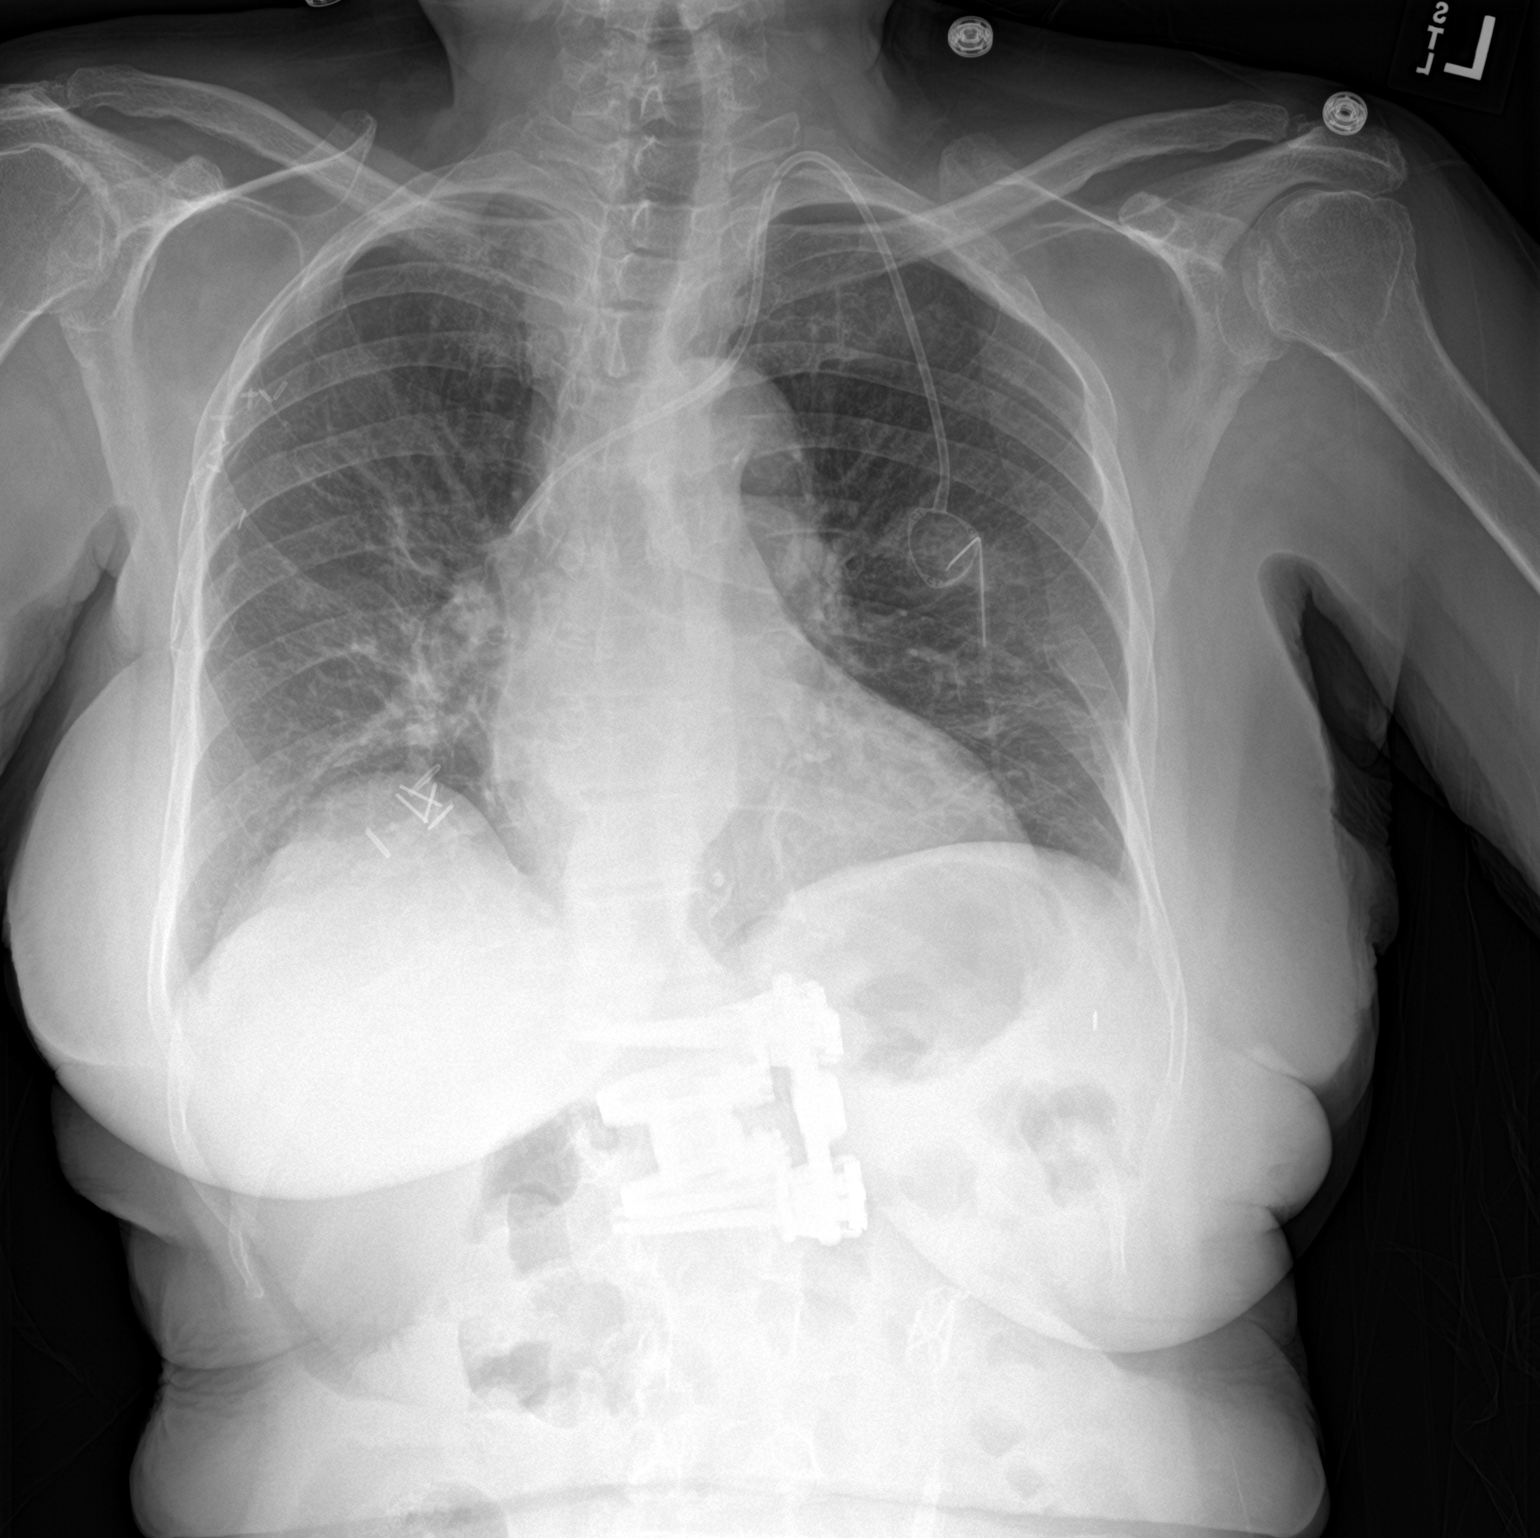

[chest lat]
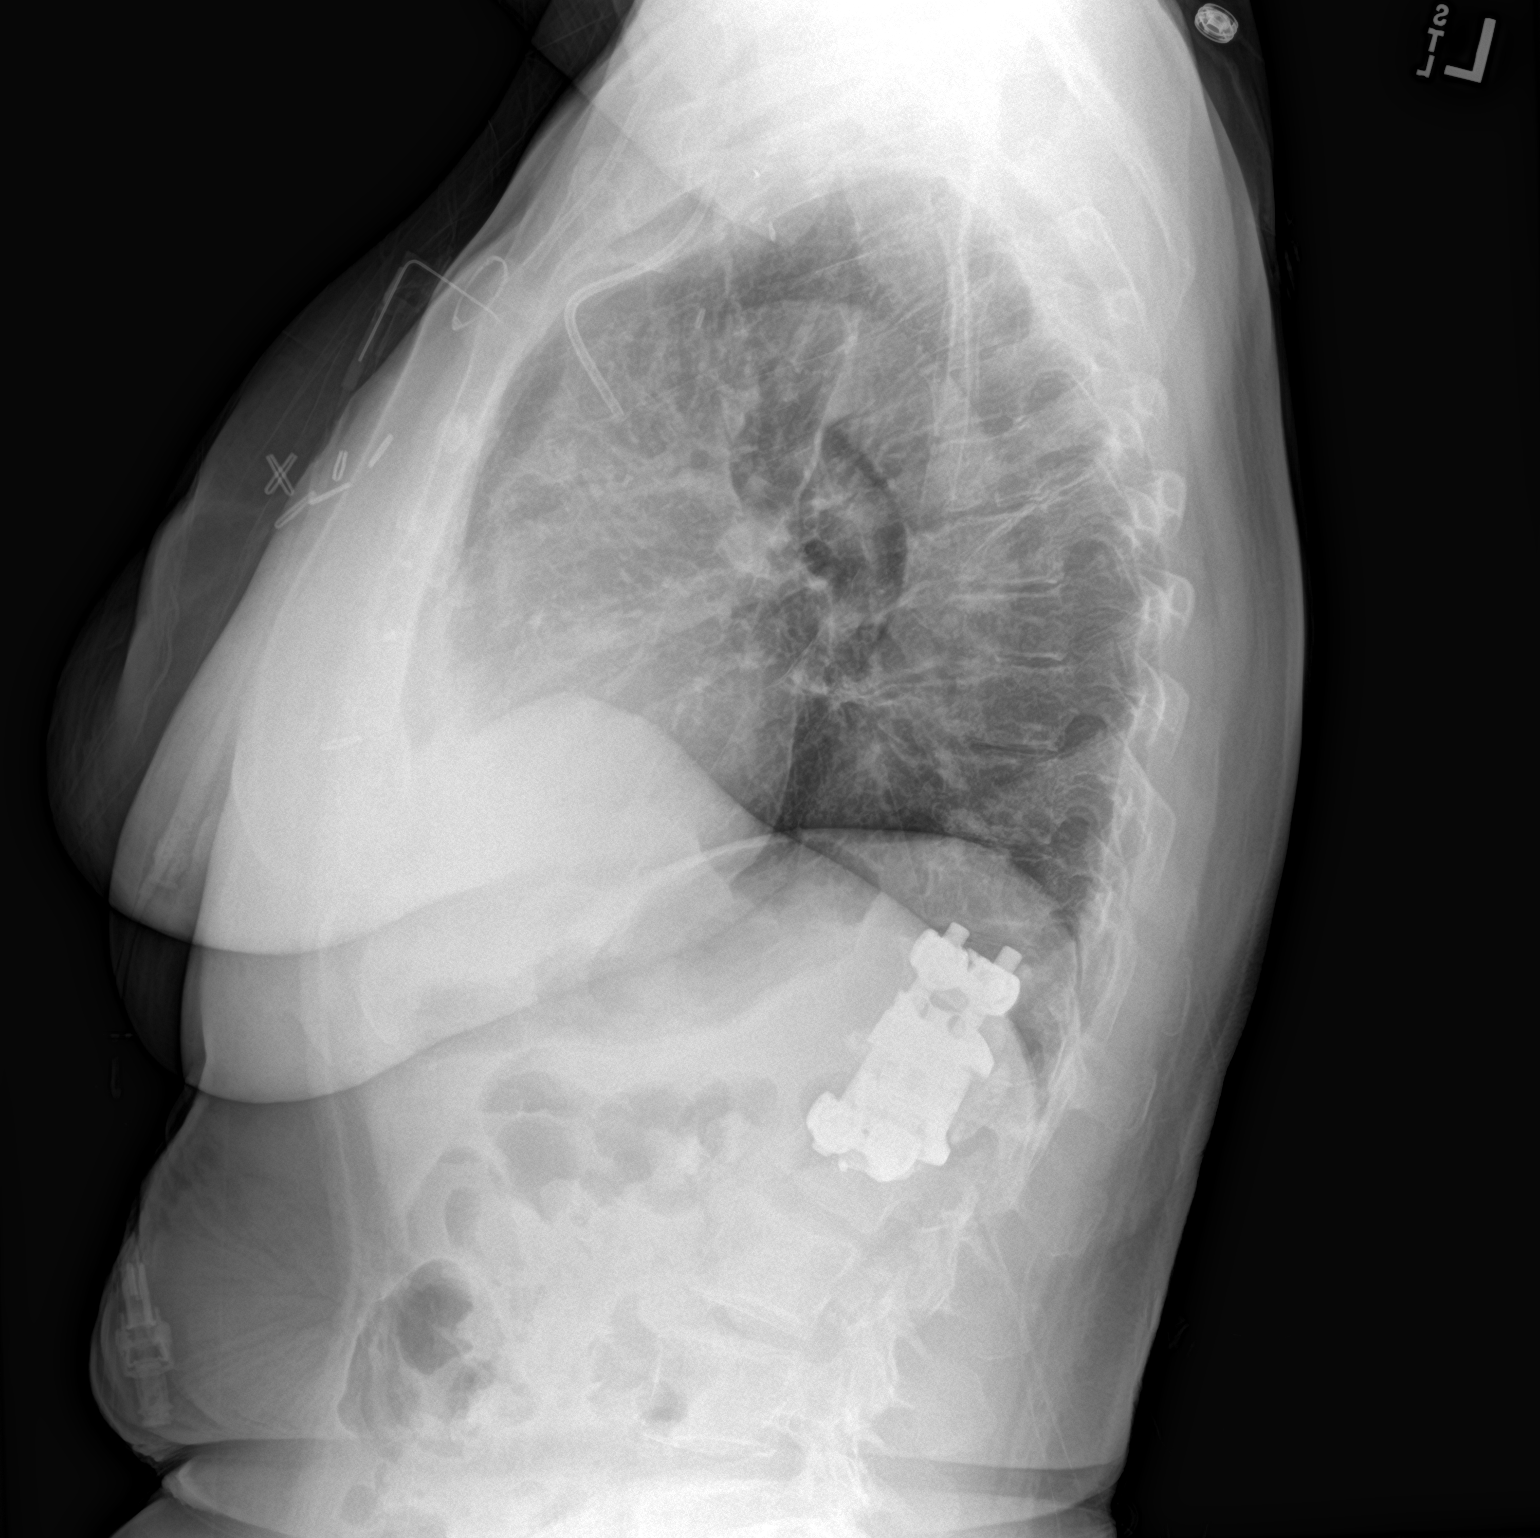

[2 of 2 positions shown; findings below may reference images not displayed]

FINDINGS: There is a left-sided Port-A-Cath in satisfactory position. There is
no focal parenchymal opacity. There is no pleural effusion or
pneumothorax. The heart and mediastinal contours are unremarkable.

The osseous structures are unremarkable.
IMPRESSION: No active cardiopulmonary disease.

## 2019-10-18 IMAGING — CT CT ABDOMEN AND PELVIS WITH CONTRAST
2 of 5 series · 13 of 46 positions shown, 15 images · IV contrast (OMNIPAQUE)
Comparison: CT the chest, abdomen and pelvis 05/24/2018.

CLINICAL DATA: 77-year-old female with history of breast cancer.
Staging examination.

EXAM:
CT ABDOMEN AND PELVIS WITH CONTRAST
TECHNIQUE: Multidetector CT imaging of the abdomen and pelvis was performed
using the standard protocol following bolus administration of
intravenous contrast.
CONTRAST:  100mL OMNIPAQUE IOHEXOL 300 MG/ML  SOLN

[Series 2: axial st · axial · 0.68mm/px · z∈[-685,-275]mm · 10 of 96 slices shown, 12 images]
[im 7/96  soft-tissue]
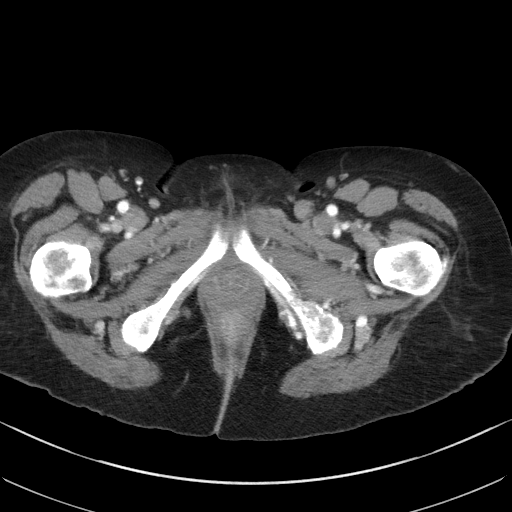
[im 7/96  bone]
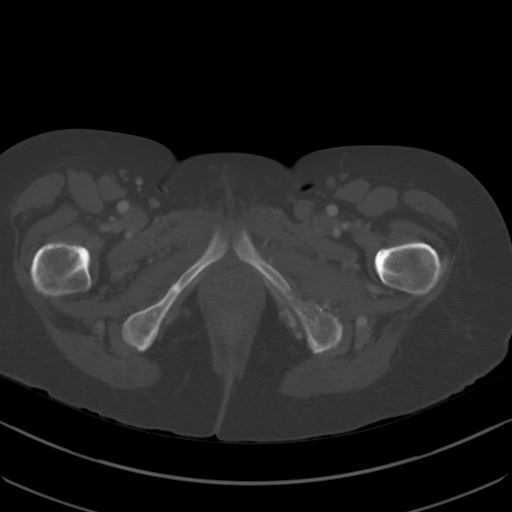
[im 14/96  soft-tissue]
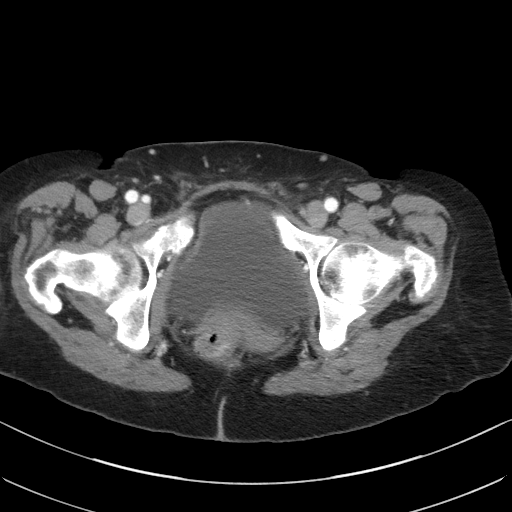
[im 28/96  soft-tissue]
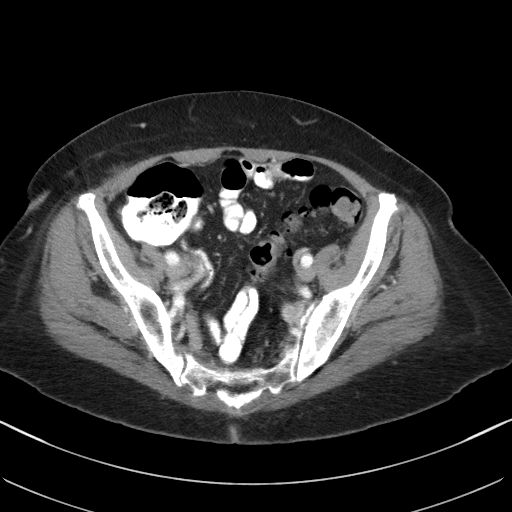
[im 34/96  soft-tissue]
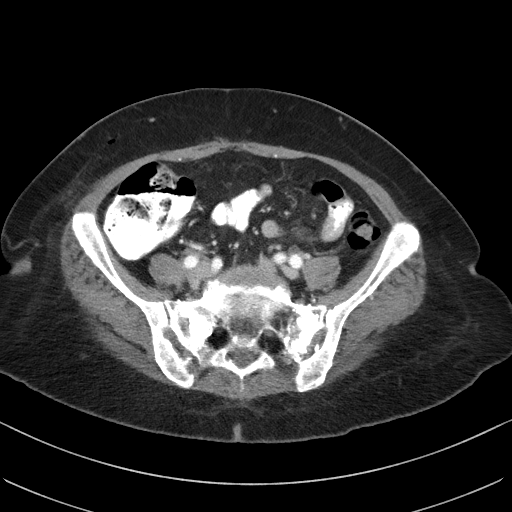
[im 41/96  soft-tissue]
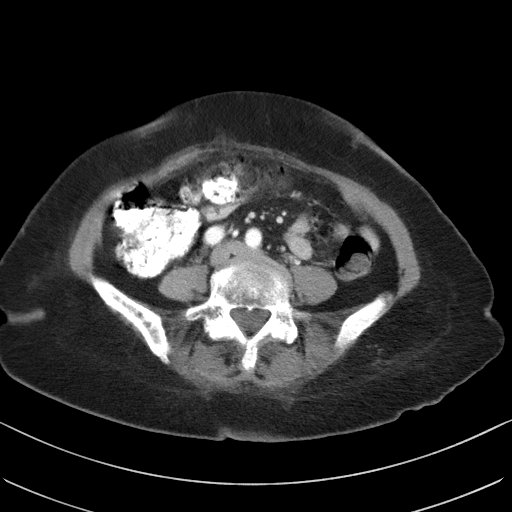
[im 55/96  soft-tissue]
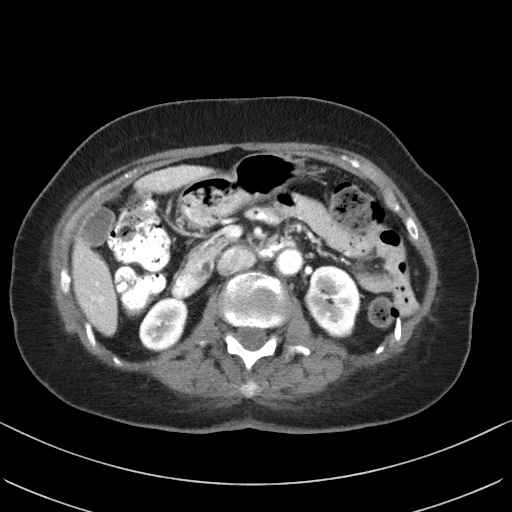
[im 62/96  soft-tissue]
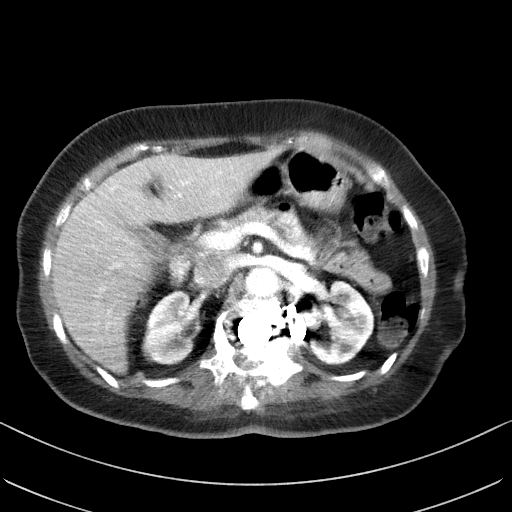
[im 68/96  soft-tissue]
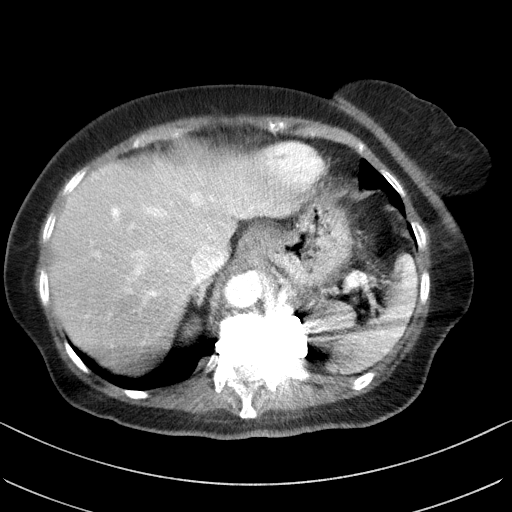
[im 82/96  soft-tissue]
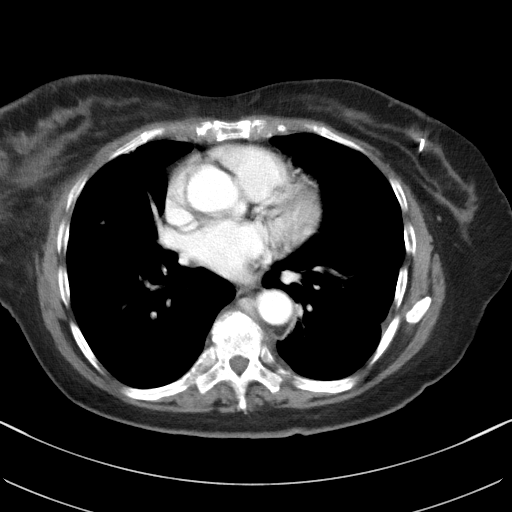
[im 82/96  bone]
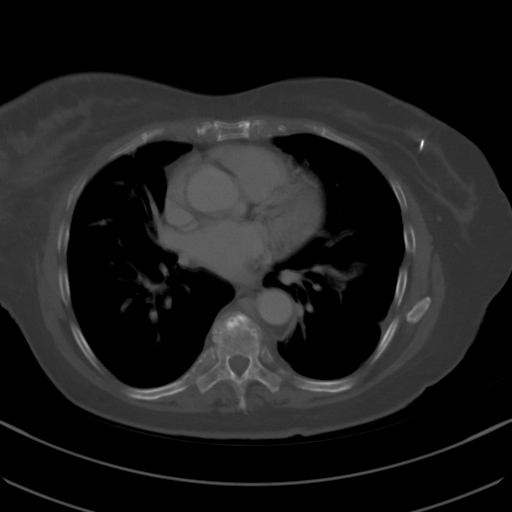
[im 89/96  soft-tissue]
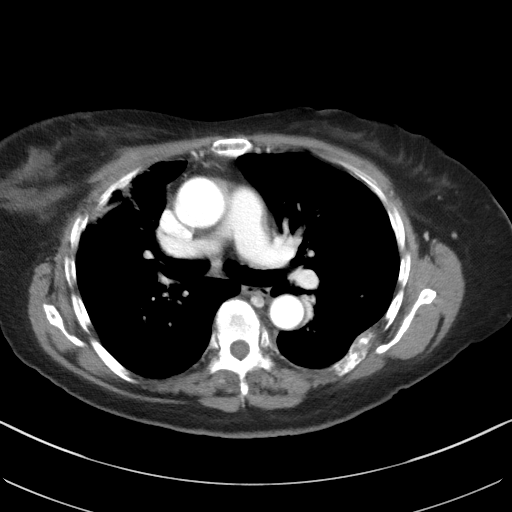

[Series 5: coronal st · coronal · 0.52mm/px · 3 of 70 slices shown]
[im 24/70  soft-tissue]
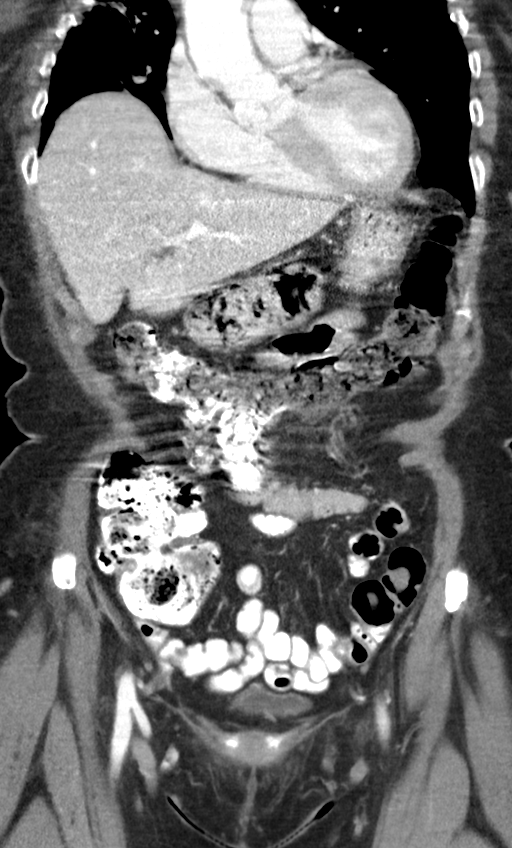
[im 31/70  soft-tissue]
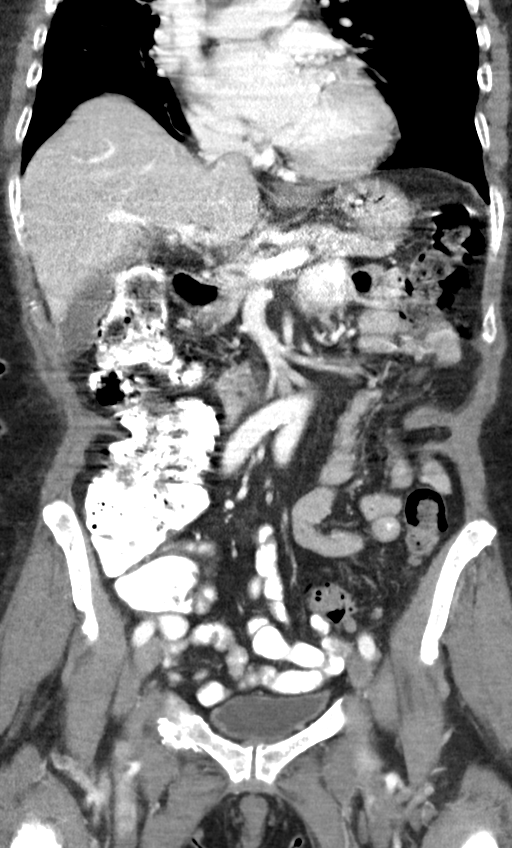
[im 39/70  soft-tissue]
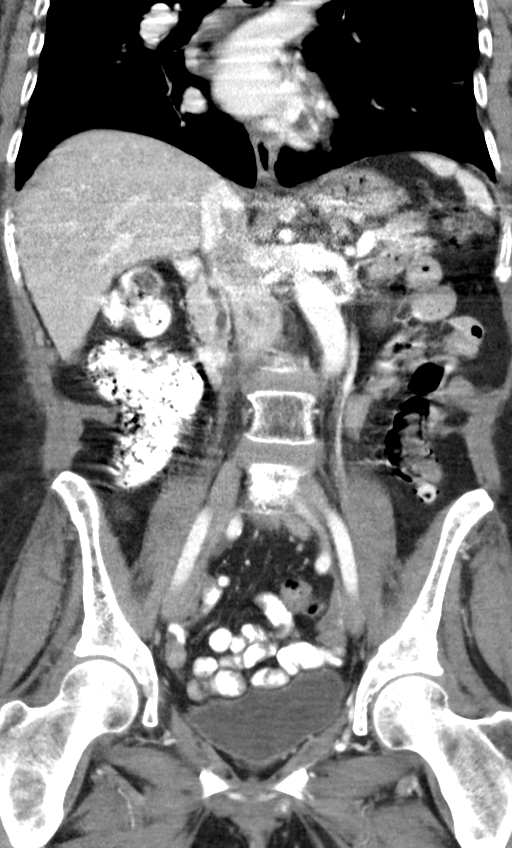

[13 of 46 positions shown; findings below may reference images not displayed]

FINDINGS: Lower chest: Postradiation changes in the subpleural aspect of the
anterior right upper lobe, similar to priors examinations, most
compatible with chronic postradiation changes. Postoperative changes
of lumpectomy in the right breast are again noted. Scattered areas
of linear scarring are noted throughout both lungs. Previously noted
nodule in the superior segment of the left lower lobe abutting the
major fissure has increased in size (axial image 16 of series 4)
currently measuring 10 x 8 mm (previously 6 x 6 mm on 05/24/2018).

Hepatobiliary: 3.0 x 3.0 cm hypovascular mass in segment 7 of the
liver (axial image 26 of series 2), markedly increased in size
compared to the prior examination (previously 1 cm). No other
definite hepatic lesions are identified on today's examination. No
intra or extrahepatic biliary ductal dilatation. Gallbladder is
normal in appearance.

Pancreas: No pancreatic mass. No pancreatic ductal dilatation. No
pancreatic or peripancreatic fluid or inflammatory changes.

Spleen: Unremarkable.

Adrenals/Urinary Tract: Tiny subcentimeter low-attenuation lesion in
the interpolar region of the right kidney. Left kidney and bilateral
adrenal glands are normal in appearance. No hydroureteronephrosis.
Urinary bladder is normal in appearance.

Stomach/Bowel: Normal appearance of the stomach. No pathologic
dilatation of small bowel or colon. Numerous colonic diverticulae
are noted, without surrounding inflammatory changes to suggest an
acute diverticulitis at this time. Normal appendix.

Vascular/Lymphatic: Aortic atherosclerosis, without evidence of
aneurysm or dissection in the abdominal or pelvic vasculature. No
lymphadenopathy noted in the abdomen or pelvis.

Reproductive: Thickening of the endometrium in the fundus of the
uterus measuring up to 17 mm. Ovaries are unremarkable in
appearance.

Other: No significant volume of ascites.  No pneumoperitoneum.

Musculoskeletal: Multiple mixed lytic and sclerotic lesions are
again noted widely spread throughout the visualized axial and
appendicular skeleton. Many of these lesions appear progressive
compared to prior examinations, concerning for progressive
metastatic disease. A specific example of this is a expansile
predominantly lytic lesion in the posterior aspect of a mid left rib
(likely left seventh rib) which was previously sclerotic and
nonexpansile (axial image 8 of series 2) where there appears to be a
pathologic fracture. Increasingly expansile and lytic appearance of
the head and neck of the right ninth rib also noted. Enlarging
mildly expansile lytic lesion in the left inferior pubic ramus
(axial image 91 of series 2) which was previously a subtle area of
sclerosis.
IMPRESSION: 1. Today's study demonstrates progressive metastatic disease in the
chest, abdomen and pelvis, most notable for progression of osseous
metastases, as well as enlarging pulmonary nodule in the superior
segment of the left lower lobe, and enlarging hypovascular lesion in
segment 7 of the liver.
2. Additional incidental findings, as above.

## 2019-11-06 IMAGING — DX PORTABLE CHEST - 1 VIEW
1 series · 1 of 1 positions shown · non-contrast
Comparison: 10/04/2018

CLINICAL DATA: History of metastatic breast carcinoma with
decreased responsiveness, initial encounter

EXAM:
PORTABLE CHEST 1 VIEW

[chest ap]
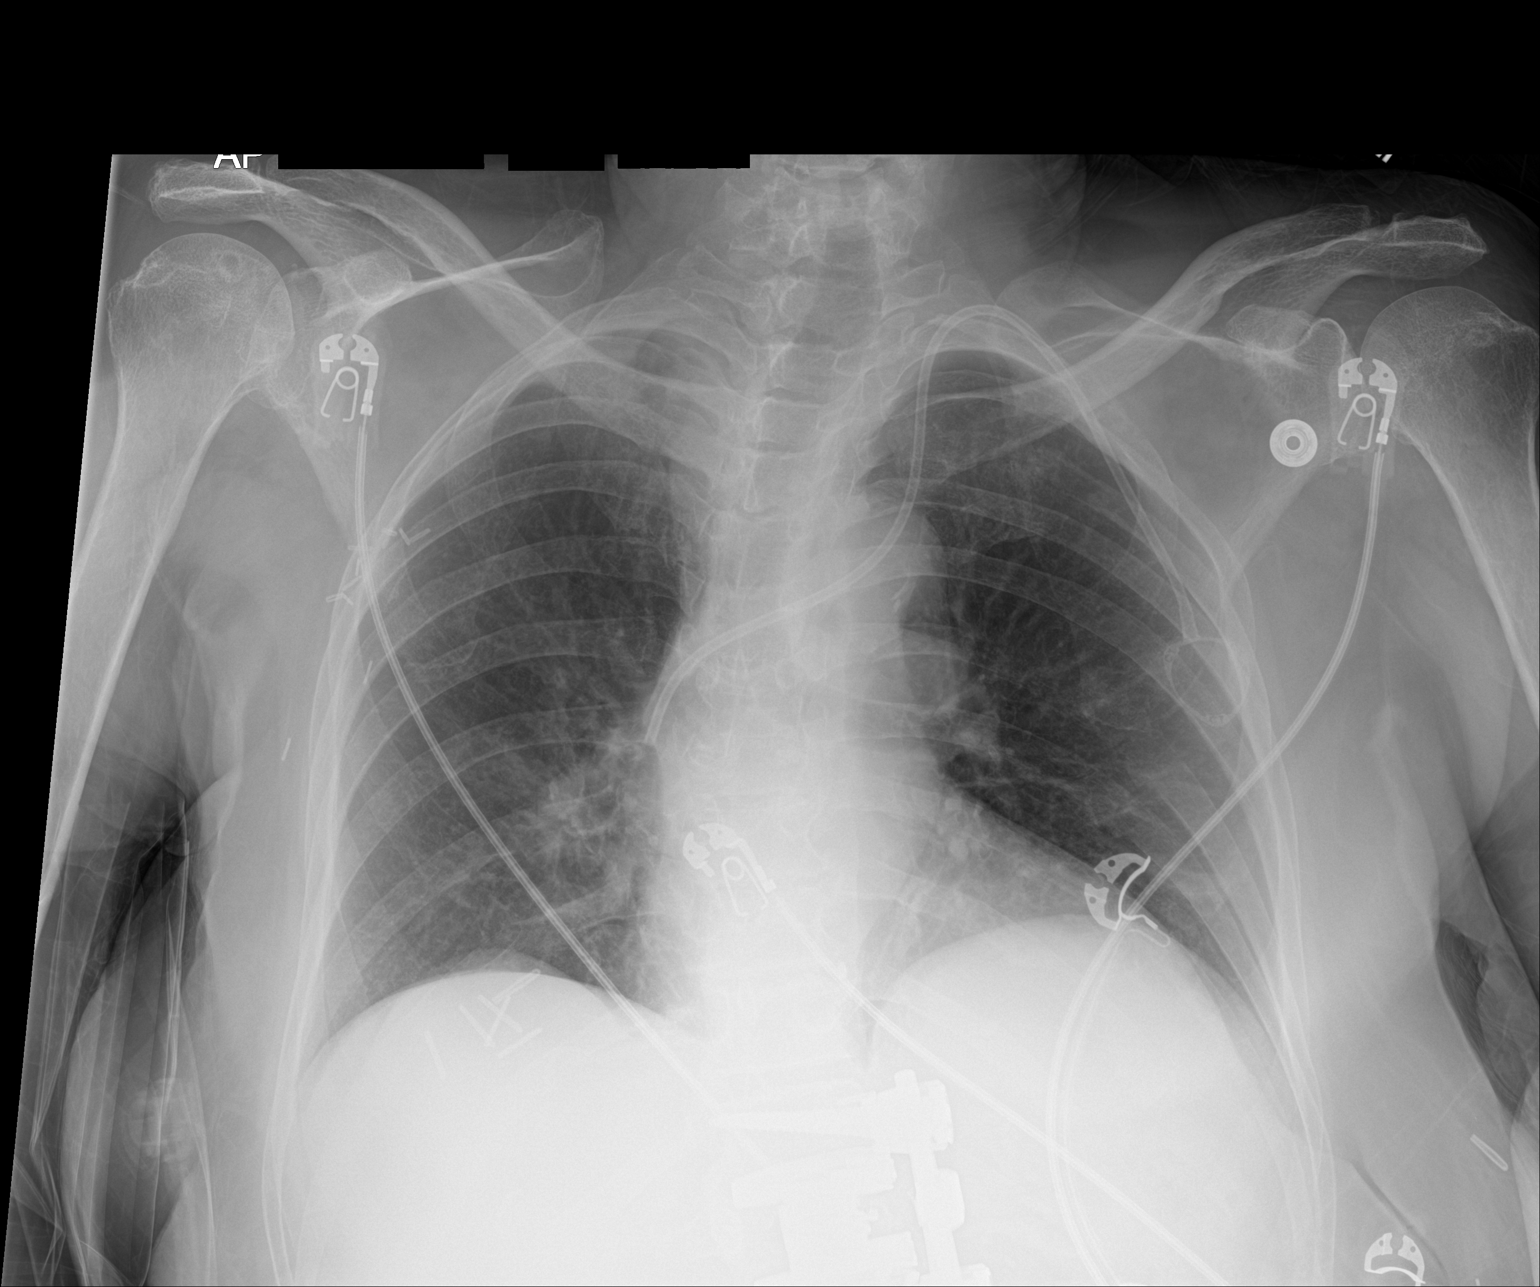

[1 of 1 positions shown; findings below may reference images not displayed]

FINDINGS: Cardiac shadow is stable. Left-sided chest wall port is again seen
and stable. Postsurgical changes in the right breast and
thoracolumbar spine are again seen and stable. Metastatic disease in
the posterior left rib cage is noted similar to that seen on prior
exams. No new focal infiltrate or effusion is seen.
IMPRESSION: Changes consistent with the patient's known history. No acute
abnormality noted.

## 2021-09-22 ENCOUNTER — Encounter: Payer: Self-pay | Admitting: Hematology & Oncology

## 2021-11-23 ENCOUNTER — Other Ambulatory Visit: Payer: Self-pay | Admitting: Nurse Practitioner
